# Patient Record
Sex: Female | Born: 1935
Health system: Southern US, Community
[De-identification: ages and names within clinical notes are randomized; demographics above are authoritative.]

## PROBLEM LIST (undated history)

## (undated) DIAGNOSIS — I8390 Asymptomatic varicose veins of unspecified lower extremity: Secondary | ICD-10-CM

## (undated) DIAGNOSIS — D649 Anemia, unspecified: Secondary | ICD-10-CM

## (undated) DIAGNOSIS — M1990 Unspecified osteoarthritis, unspecified site: Secondary | ICD-10-CM

## (undated) DIAGNOSIS — H269 Unspecified cataract: Secondary | ICD-10-CM

## (undated) DIAGNOSIS — E440 Moderate protein-calorie malnutrition: Secondary | ICD-10-CM

## (undated) DIAGNOSIS — G603 Idiopathic progressive neuropathy: Secondary | ICD-10-CM

## (undated) DIAGNOSIS — I10 Essential (primary) hypertension: Secondary | ICD-10-CM

## (undated) DIAGNOSIS — N2581 Secondary hyperparathyroidism of renal origin: Secondary | ICD-10-CM

## (undated) DIAGNOSIS — M5416 Radiculopathy, lumbar region: Secondary | ICD-10-CM

## (undated) DIAGNOSIS — M4126 Other idiopathic scoliosis, lumbar region: Secondary | ICD-10-CM

## (undated) DIAGNOSIS — D509 Iron deficiency anemia, unspecified: Secondary | ICD-10-CM

## (undated) DIAGNOSIS — F5101 Primary insomnia: Secondary | ICD-10-CM

## (undated) DIAGNOSIS — M1711 Unilateral primary osteoarthritis, right knee: Secondary | ICD-10-CM

## (undated) DIAGNOSIS — J479 Bronchiectasis, uncomplicated: Secondary | ICD-10-CM

## (undated) DIAGNOSIS — F419 Anxiety disorder, unspecified: Secondary | ICD-10-CM

## (undated) DIAGNOSIS — I728 Aneurysm of other specified arteries: Secondary | ICD-10-CM

## (undated) DIAGNOSIS — E785 Hyperlipidemia, unspecified: Secondary | ICD-10-CM

## (undated) DIAGNOSIS — M150 Primary generalized (osteo)arthritis: Secondary | ICD-10-CM

## (undated) DIAGNOSIS — M1712 Unilateral primary osteoarthritis, left knee: Secondary | ICD-10-CM

## (undated) DIAGNOSIS — J45909 Unspecified asthma, uncomplicated: Secondary | ICD-10-CM

## (undated) DIAGNOSIS — K5904 Chronic idiopathic constipation: Secondary | ICD-10-CM

## (undated) DIAGNOSIS — N390 Urinary tract infection, site not specified: Secondary | ICD-10-CM

## (undated) DIAGNOSIS — N183 Chronic kidney disease, stage 3 unspecified: Secondary | ICD-10-CM

## (undated) DIAGNOSIS — E079 Disorder of thyroid, unspecified: Secondary | ICD-10-CM

## (undated) DIAGNOSIS — I87393 Chronic venous hypertension (idiopathic) with other complications of bilateral lower extremity: Secondary | ICD-10-CM

## (undated) DIAGNOSIS — M419 Scoliosis, unspecified: Secondary | ICD-10-CM

## (undated) DIAGNOSIS — G8929 Other chronic pain: Secondary | ICD-10-CM

## (undated) DIAGNOSIS — T790XXA Air embolism (traumatic), initial encounter: Secondary | ICD-10-CM

## (undated) DIAGNOSIS — E559 Vitamin D deficiency, unspecified: Secondary | ICD-10-CM

## (undated) DIAGNOSIS — R079 Chest pain, unspecified: Secondary | ICD-10-CM

## (undated) DIAGNOSIS — F329 Major depressive disorder, single episode, unspecified: Secondary | ICD-10-CM

## (undated) DIAGNOSIS — F32A Depression, unspecified: Secondary | ICD-10-CM

## (undated) DIAGNOSIS — R011 Cardiac murmur, unspecified: Secondary | ICD-10-CM

## (undated) DIAGNOSIS — K219 Gastro-esophageal reflux disease without esophagitis: Secondary | ICD-10-CM

## (undated) HISTORY — DX: Other chronic pain: G89.29

## (undated) HISTORY — DX: Depression, unspecified: F32.A

## (undated) HISTORY — DX: Iron deficiency anemia, unspecified: D50.9

## (undated) HISTORY — DX: Urinary tract infection, site not specified: N39.0

## (undated) HISTORY — DX: Aneurysm of other specified arteries: I72.8

## (undated) HISTORY — DX: Major depressive disorder, single episode, unspecified: F32.9

## (undated) HISTORY — DX: Vitamin D deficiency, unspecified: E55.9

## (undated) HISTORY — DX: Unspecified asthma, uncomplicated: J45.909

## (undated) HISTORY — DX: Essential (primary) hypertension: I10

## (undated) HISTORY — DX: Other idiopathic scoliosis, lumbar region: M41.26

## (undated) HISTORY — DX: Chronic idiopathic constipation: K59.04

## (undated) HISTORY — DX: Chronic venous hypertension (idiopathic) with other complications of bilateral lower extremity: I87.393

## (undated) HISTORY — DX: Cardiac murmur, unspecified: R01.1

## (undated) HISTORY — DX: Anxiety disorder, unspecified: F41.9

## (undated) HISTORY — DX: Air embolism (traumatic), initial encounter: T79.0XXA

## (undated) HISTORY — DX: Disorder of thyroid, unspecified: E07.9

## (undated) HISTORY — PX: ABDOMINAL HYSTERECTOMY: SHX81

## (undated) HISTORY — DX: Unspecified cataract: H26.9

## (undated) HISTORY — DX: Chronic kidney disease, stage 3 unspecified: N18.30

## (undated) HISTORY — DX: Unilateral primary osteoarthritis, right knee: M17.11

## (undated) HISTORY — DX: Chest pain, unspecified: R07.9

## (undated) HISTORY — DX: Unspecified osteoarthritis, unspecified site: M19.90

## (undated) HISTORY — DX: Secondary hyperparathyroidism of renal origin: N25.81

## (undated) HISTORY — DX: Hyperlipidemia, unspecified: E78.5

## (undated) HISTORY — PX: CYSTOCELE REPAIR: SHX163

## (undated) HISTORY — DX: Anemia, unspecified: D64.9

## (undated) HISTORY — DX: Idiopathic progressive neuropathy: G60.3

## (undated) HISTORY — PX: SALPINGOOPHORECTOMY: SHX82

## (undated) HISTORY — DX: Radiculopathy, lumbar region: M54.16

## (undated) HISTORY — DX: Asymptomatic varicose veins of unspecified lower extremity: I83.90

## (undated) HISTORY — DX: Unilateral primary osteoarthritis, left knee: M17.12

## (undated) HISTORY — DX: Gastro-esophageal reflux disease without esophagitis: K21.9

## (undated) HISTORY — DX: Primary insomnia: F51.01

## (undated) HISTORY — DX: Moderate protein-calorie malnutrition: E44.0

## (undated) HISTORY — DX: Primary generalized (osteo)arthritis: M15.0

---

## 1998-02-12 ENCOUNTER — Emergency Department (HOSPITAL_COMMUNITY): Admission: EM | Admit: 1998-02-12 | Discharge: 1998-02-12 | Payer: Self-pay | Admitting: Emergency Medicine

## 1998-02-25 ENCOUNTER — Ambulatory Visit: Admission: RE | Admit: 1998-02-25 | Discharge: 1998-02-25 | Payer: Self-pay

## 1998-03-09 ENCOUNTER — Encounter: Admission: RE | Admit: 1998-03-09 | Discharge: 1998-03-09 | Payer: Self-pay | Admitting: Internal Medicine

## 1998-08-11 ENCOUNTER — Encounter: Admission: RE | Admit: 1998-08-11 | Discharge: 1998-08-11 | Payer: Self-pay | Admitting: Internal Medicine

## 1998-09-23 ENCOUNTER — Encounter: Admission: RE | Admit: 1998-09-23 | Discharge: 1998-09-23 | Payer: Self-pay | Admitting: Internal Medicine

## 1999-02-07 ENCOUNTER — Encounter: Admission: RE | Admit: 1999-02-07 | Discharge: 1999-02-07 | Payer: Self-pay | Admitting: Internal Medicine

## 1999-02-07 ENCOUNTER — Ambulatory Visit (HOSPITAL_COMMUNITY): Admission: RE | Admit: 1999-02-07 | Discharge: 1999-02-07 | Payer: Self-pay | Admitting: Internal Medicine

## 1999-02-07 ENCOUNTER — Encounter: Payer: Self-pay | Admitting: Internal Medicine

## 1999-02-13 ENCOUNTER — Ambulatory Visit (HOSPITAL_COMMUNITY): Admission: RE | Admit: 1999-02-13 | Discharge: 1999-02-13 | Payer: Self-pay

## 1999-02-14 ENCOUNTER — Encounter: Admission: RE | Admit: 1999-02-14 | Discharge: 1999-02-14 | Payer: Self-pay | Admitting: Hematology and Oncology

## 1999-03-17 ENCOUNTER — Encounter: Admission: RE | Admit: 1999-03-17 | Discharge: 1999-03-17 | Payer: Self-pay | Admitting: Internal Medicine

## 1999-06-12 ENCOUNTER — Encounter: Admission: RE | Admit: 1999-06-12 | Discharge: 1999-06-12 | Payer: Self-pay | Admitting: Internal Medicine

## 1999-07-10 ENCOUNTER — Encounter: Admission: RE | Admit: 1999-07-10 | Discharge: 1999-07-10 | Payer: Self-pay | Admitting: Internal Medicine

## 1999-08-21 ENCOUNTER — Ambulatory Visit (HOSPITAL_COMMUNITY): Admission: RE | Admit: 1999-08-21 | Discharge: 1999-08-21 | Payer: Self-pay

## 1999-09-25 ENCOUNTER — Encounter: Admission: RE | Admit: 1999-09-25 | Discharge: 1999-09-25 | Payer: Self-pay | Admitting: Internal Medicine

## 2000-01-22 ENCOUNTER — Encounter: Admission: RE | Admit: 2000-01-22 | Discharge: 2000-01-22 | Payer: Self-pay | Admitting: Internal Medicine

## 2000-07-29 ENCOUNTER — Encounter: Admission: RE | Admit: 2000-07-29 | Discharge: 2000-07-29 | Payer: Self-pay | Admitting: Internal Medicine

## 2000-10-01 ENCOUNTER — Encounter: Admission: RE | Admit: 2000-10-01 | Discharge: 2000-10-01 | Payer: Self-pay

## 2000-10-08 ENCOUNTER — Ambulatory Visit (HOSPITAL_COMMUNITY): Admission: RE | Admit: 2000-10-08 | Discharge: 2000-10-08 | Payer: Self-pay

## 2001-01-27 ENCOUNTER — Encounter: Admission: RE | Admit: 2001-01-27 | Discharge: 2001-01-27 | Payer: Self-pay

## 2001-01-29 ENCOUNTER — Encounter: Admission: RE | Admit: 2001-01-29 | Discharge: 2001-01-29 | Payer: Self-pay

## 2001-03-31 ENCOUNTER — Encounter: Admission: RE | Admit: 2001-03-31 | Discharge: 2001-03-31 | Payer: Self-pay | Admitting: Internal Medicine

## 2014-10-26 DIAGNOSIS — M25562 Pain in left knee: Secondary | ICD-10-CM | POA: Diagnosis not present

## 2014-10-26 DIAGNOSIS — G5792 Unspecified mononeuropathy of left lower limb: Secondary | ICD-10-CM | POA: Diagnosis not present

## 2014-10-26 DIAGNOSIS — G5791 Unspecified mononeuropathy of right lower limb: Secondary | ICD-10-CM | POA: Diagnosis not present

## 2014-11-15 DIAGNOSIS — R3915 Urgency of urination: Secondary | ICD-10-CM | POA: Diagnosis not present

## 2014-11-15 DIAGNOSIS — N3000 Acute cystitis without hematuria: Secondary | ICD-10-CM | POA: Diagnosis not present

## 2014-11-18 DIAGNOSIS — B9620 Unspecified Escherichia coli [E. coli] as the cause of diseases classified elsewhere: Secondary | ICD-10-CM | POA: Diagnosis not present

## 2014-11-18 DIAGNOSIS — N8110 Cystocele, unspecified: Secondary | ICD-10-CM | POA: Diagnosis not present

## 2014-11-18 DIAGNOSIS — N10 Acute tubulo-interstitial nephritis: Secondary | ICD-10-CM | POA: Diagnosis not present

## 2014-11-18 DIAGNOSIS — R05 Cough: Secondary | ICD-10-CM | POA: Diagnosis not present

## 2014-11-18 DIAGNOSIS — R0989 Other specified symptoms and signs involving the circulatory and respiratory systems: Secondary | ICD-10-CM | POA: Diagnosis not present

## 2014-11-18 DIAGNOSIS — E876 Hypokalemia: Secondary | ICD-10-CM | POA: Diagnosis not present

## 2014-11-18 DIAGNOSIS — R509 Fever, unspecified: Secondary | ICD-10-CM | POA: Diagnosis not present

## 2014-11-18 DIAGNOSIS — R531 Weakness: Secondary | ICD-10-CM | POA: Diagnosis not present

## 2014-11-18 DIAGNOSIS — I10 Essential (primary) hypertension: Secondary | ICD-10-CM | POA: Diagnosis not present

## 2014-11-18 DIAGNOSIS — R300 Dysuria: Secondary | ICD-10-CM | POA: Diagnosis not present

## 2014-11-18 DIAGNOSIS — J479 Bronchiectasis, uncomplicated: Secondary | ICD-10-CM | POA: Diagnosis not present

## 2014-11-18 DIAGNOSIS — N390 Urinary tract infection, site not specified: Secondary | ICD-10-CM | POA: Diagnosis not present

## 2014-11-23 DIAGNOSIS — E786 Lipoprotein deficiency: Secondary | ICD-10-CM | POA: Diagnosis not present

## 2014-11-23 DIAGNOSIS — E782 Mixed hyperlipidemia: Secondary | ICD-10-CM | POA: Diagnosis not present

## 2014-11-23 DIAGNOSIS — I10 Essential (primary) hypertension: Secondary | ICD-10-CM | POA: Diagnosis not present

## 2014-11-23 DIAGNOSIS — N10 Acute tubulo-interstitial nephritis: Secondary | ICD-10-CM | POA: Diagnosis not present

## 2014-11-23 DIAGNOSIS — J471 Bronchiectasis with (acute) exacerbation: Secondary | ICD-10-CM | POA: Diagnosis not present

## 2014-11-23 DIAGNOSIS — R42 Dizziness and giddiness: Secondary | ICD-10-CM | POA: Diagnosis not present

## 2014-12-03 ENCOUNTER — Institutional Professional Consult (permissible substitution): Payer: Self-pay | Admitting: Internal Medicine

## 2014-12-03 DIAGNOSIS — R6883 Chills (without fever): Secondary | ICD-10-CM | POA: Diagnosis not present

## 2014-12-03 DIAGNOSIS — J181 Lobar pneumonia, unspecified organism: Secondary | ICD-10-CM | POA: Diagnosis not present

## 2014-12-03 DIAGNOSIS — R062 Wheezing: Secondary | ICD-10-CM | POA: Diagnosis not present

## 2014-12-07 ENCOUNTER — Encounter: Payer: Self-pay | Admitting: Cardiovascular Disease

## 2014-12-07 DIAGNOSIS — R600 Localized edema: Secondary | ICD-10-CM | POA: Diagnosis not present

## 2014-12-07 DIAGNOSIS — R011 Cardiac murmur, unspecified: Secondary | ICD-10-CM | POA: Diagnosis not present

## 2014-12-07 DIAGNOSIS — J449 Chronic obstructive pulmonary disease, unspecified: Secondary | ICD-10-CM | POA: Diagnosis not present

## 2014-12-07 DIAGNOSIS — R0602 Shortness of breath: Secondary | ICD-10-CM | POA: Diagnosis not present

## 2014-12-07 DIAGNOSIS — J181 Lobar pneumonia, unspecified organism: Secondary | ICD-10-CM | POA: Diagnosis not present

## 2014-12-07 DIAGNOSIS — I083 Combined rheumatic disorders of mitral, aortic and tricuspid valves: Secondary | ICD-10-CM | POA: Diagnosis not present

## 2014-12-08 ENCOUNTER — Institutional Professional Consult (permissible substitution): Payer: Self-pay | Admitting: Internal Medicine

## 2014-12-10 DIAGNOSIS — R0602 Shortness of breath: Secondary | ICD-10-CM | POA: Diagnosis not present

## 2014-12-16 DIAGNOSIS — R531 Weakness: Secondary | ICD-10-CM | POA: Diagnosis not present

## 2014-12-16 DIAGNOSIS — F329 Major depressive disorder, single episode, unspecified: Secondary | ICD-10-CM | POA: Diagnosis not present

## 2014-12-16 DIAGNOSIS — I509 Heart failure, unspecified: Secondary | ICD-10-CM | POA: Diagnosis not present

## 2014-12-20 DIAGNOSIS — I509 Heart failure, unspecified: Secondary | ICD-10-CM | POA: Diagnosis not present

## 2014-12-20 DIAGNOSIS — R531 Weakness: Secondary | ICD-10-CM | POA: Diagnosis not present

## 2014-12-20 DIAGNOSIS — F329 Major depressive disorder, single episode, unspecified: Secondary | ICD-10-CM | POA: Diagnosis not present

## 2014-12-21 DIAGNOSIS — I509 Heart failure, unspecified: Secondary | ICD-10-CM | POA: Diagnosis not present

## 2014-12-21 DIAGNOSIS — R531 Weakness: Secondary | ICD-10-CM | POA: Diagnosis not present

## 2014-12-21 DIAGNOSIS — F329 Major depressive disorder, single episode, unspecified: Secondary | ICD-10-CM | POA: Diagnosis not present

## 2014-12-23 DIAGNOSIS — I509 Heart failure, unspecified: Secondary | ICD-10-CM | POA: Diagnosis not present

## 2014-12-23 DIAGNOSIS — F329 Major depressive disorder, single episode, unspecified: Secondary | ICD-10-CM | POA: Diagnosis not present

## 2014-12-23 DIAGNOSIS — R531 Weakness: Secondary | ICD-10-CM | POA: Diagnosis not present

## 2014-12-24 DIAGNOSIS — R634 Abnormal weight loss: Secondary | ICD-10-CM | POA: Diagnosis not present

## 2014-12-24 DIAGNOSIS — I10 Essential (primary) hypertension: Secondary | ICD-10-CM | POA: Diagnosis not present

## 2014-12-24 DIAGNOSIS — K5909 Other constipation: Secondary | ICD-10-CM | POA: Diagnosis not present

## 2014-12-24 DIAGNOSIS — R5383 Other fatigue: Secondary | ICD-10-CM | POA: Diagnosis not present

## 2014-12-24 DIAGNOSIS — R0602 Shortness of breath: Secondary | ICD-10-CM | POA: Diagnosis not present

## 2014-12-24 DIAGNOSIS — R29898 Other symptoms and signs involving the musculoskeletal system: Secondary | ICD-10-CM | POA: Diagnosis not present

## 2014-12-24 DIAGNOSIS — D6489 Other specified anemias: Secondary | ICD-10-CM | POA: Diagnosis not present

## 2014-12-25 DIAGNOSIS — R531 Weakness: Secondary | ICD-10-CM | POA: Diagnosis not present

## 2014-12-25 DIAGNOSIS — F329 Major depressive disorder, single episode, unspecified: Secondary | ICD-10-CM | POA: Diagnosis not present

## 2014-12-25 DIAGNOSIS — I509 Heart failure, unspecified: Secondary | ICD-10-CM | POA: Diagnosis not present

## 2014-12-27 DIAGNOSIS — F329 Major depressive disorder, single episode, unspecified: Secondary | ICD-10-CM | POA: Diagnosis not present

## 2014-12-27 DIAGNOSIS — R531 Weakness: Secondary | ICD-10-CM | POA: Diagnosis not present

## 2014-12-27 DIAGNOSIS — I509 Heart failure, unspecified: Secondary | ICD-10-CM | POA: Diagnosis not present

## 2014-12-28 DIAGNOSIS — F329 Major depressive disorder, single episode, unspecified: Secondary | ICD-10-CM | POA: Diagnosis not present

## 2014-12-28 DIAGNOSIS — R531 Weakness: Secondary | ICD-10-CM | POA: Diagnosis not present

## 2014-12-28 DIAGNOSIS — I509 Heart failure, unspecified: Secondary | ICD-10-CM | POA: Diagnosis not present

## 2014-12-29 DIAGNOSIS — F329 Major depressive disorder, single episode, unspecified: Secondary | ICD-10-CM | POA: Diagnosis not present

## 2014-12-29 DIAGNOSIS — R531 Weakness: Secondary | ICD-10-CM | POA: Diagnosis not present

## 2014-12-29 DIAGNOSIS — I509 Heart failure, unspecified: Secondary | ICD-10-CM | POA: Diagnosis not present

## 2014-12-30 ENCOUNTER — Encounter: Payer: Self-pay | Admitting: *Deleted

## 2014-12-30 ENCOUNTER — Telehealth: Payer: Self-pay | Admitting: *Deleted

## 2014-12-30 NOTE — Telephone Encounter (Signed)
DONE

## 2014-12-31 DIAGNOSIS — M4186 Other forms of scoliosis, lumbar region: Secondary | ICD-10-CM | POA: Diagnosis not present

## 2014-12-31 DIAGNOSIS — M4856XA Collapsed vertebra, not elsewhere classified, lumbar region, initial encounter for fracture: Secondary | ICD-10-CM | POA: Diagnosis not present

## 2014-12-31 DIAGNOSIS — M545 Low back pain: Secondary | ICD-10-CM | POA: Diagnosis not present

## 2014-12-31 DIAGNOSIS — E539 Vitamin B deficiency, unspecified: Secondary | ICD-10-CM | POA: Diagnosis not present

## 2015-01-02 NOTE — Progress Notes (Signed)
Patient ID: Stephanie Clay, female   DOB: [birth year], 81 y.o.   MRN: [id number]     Cardiology Office Note   Date:  01/03/2015   ID:  Stephanie Clay, DOB 07/17/1932, MRN [id number]  PCP:  [facility]   Cardiologist:   Jenkins Rouge, MD   No chief complaint on file.     History of Present Illness: Stephanie Clay is a 81 y.o. female who presents for  Evaluation of "CHF:.  Reviewed office notes from [place] [facility] 2/13 and on and [facility] records   Patient appeared to have pneumonia and was Rx with antibiotics zitrhromycin, rocephin and duonebs.  Influenza negative She has HTN and elevated lipids.  She has some CRF and has had lisinopril held and diuretic dose decreased Also has had recent UTI  Has asthma with bronchiectasis and has been referred to pulmonary She has poor insight into her bronchiectasis Chronic LE edema improved with diuretic  Wears support hose No chest pain. No palpitations or syncope  Labs 2/16 K 3.1 Cr .9 BNP 2280 ( upper normal 1800 )   Hct 31.9   CXR:  COPD hyperinflation increased interstitial markings   Echo: Septum 9 mm  LA 3.2 cm  EF 37%  Normal diastolic filling pattern for age mild AR estimated PA pressure 50 mmHg    Past Medical History  Diagnosis Date  . Hyperlipemia   . Asthma   . Gastro-esophageal reflux   . Chronic chest pain   . Secondary hyperparathyroidism   . Iron (Fe) deficiency anemia   . Pneumathemia     Past Surgical History  Procedure Laterality Date  . Abdominal hysterectomy    . Salpingoophorectomy    . Bladder tech       Current Outpatient Prescriptions  Medication Sig Dispense Refill  . ALPRAZolam (XANAX) 0.5 MG tablet Take 0.5 mg by mouth 2 (two) times daily as needed for anxiety.    Marland Kitchen amitriptyline (ELAVIL) 25 MG tablet Take 25 mg by mouth at bedtime.    Marland Kitchen aspirin 81 MG tablet Take 81 mg by mouth daily.    Marland Kitchen atenolol-chlorthalidone (TENORETIC) 100-25 MG per tablet Take 1 tablet by mouth daily.    Marland Kitchen  conjugated estrogens (PREMARIN) vaginal cream Place 1 Applicatorful vaginally daily.    Marland Kitchen dicyclomine (BENTYL) 10 MG capsule Take 10 mg by mouth 4 (four) times daily -  before meals and at bedtime.    . fluticasone (FLONASE) 50 MCG/ACT nasal spray Place 2 sprays into both nostrils daily.    . furosemide (LASIX) 40 MG tablet Take 40 mg by mouth daily.    Marland Kitchen gabapentin (NEURONTIN) 100 MG capsule Take 100 mg by mouth at bedtime.    Marland Kitchen HYDROcodone-acetaminophen (NORCO) 7.5-325 MG per tablet Take 1 tablet by mouth every 6 (six) hours as needed for moderate pain.    . hydrOXYzine (ATARAX/VISTARIL) 25 MG tablet Take 25 mg by mouth 3 (three) times daily as needed.    Marland Kitchen levothyroxine (SYNTHROID, LEVOTHROID) 50 MCG tablet Take 50 mcg by mouth daily before breakfast.    . ondansetron (ZOFRAN) 4 MG tablet Take 4 mg by mouth every 8 (eight) hours as needed for nausea or vomiting.    . pantoprazole (PROTONIX) 40 MG tablet Take 40 mg by mouth daily.    . potassium chloride SA (K-DUR,KLOR-CON) 20 MEQ tablet Take 20 mEq by mouth 2 (two) times daily.    . simvastatin (ZOCOR) 40 MG tablet Take 40  mg by mouth daily. Take 2 tablets by mouth daily    . triamcinolone cream (KENALOG) 0.1 % Apply 1 application topically 2 (two) times daily.     No current facility-administered medications for this visit.    Allergies:   Gabapentin; Levaquin; Lisinopril; and Remeron    Social History:  The patient  reports that she has never smoked. She does not have any smokeless tobacco history on file. She reports that she does not drink alcohol or use illicit drugs.   Family History:  The patient's family history is not on file.    ROS:  Please see the history of present illness.   Otherwise, review of systems are positive for none.   All other systems are reviewed and negative.    PHYSICAL EXAM: VS:  BP 110/50 mmHg  Pulse 65  Ht '4\' 11"'  (1.499 m)  Wt 44.815 kg (98 lb 12.8 oz)  BMI 19.94 kg/m2  SpO2 98% , BMI Body mass  index is 19.94 kg/(m^2). GEN: Well nourished, well developed, in no acute distress HEENT: normal Neck: no JVD, carotid bruits, or masses Cardiac:  RRR; no murmurs, rubs, or gallops,Trace LE edema  Respiratory:  Areas of rhonchi and wheezing  GI: soft, nontender, nondistended, + BS MS: no deformity or atrophy Skin: warm and dry, no rash Neuro:  Strength and sensation are intact Psych: euthymic mood, full affect   EKG:   SR rate 60 Normal 12/07/14    Recent Labs: No results found for requested labs within last 365 days.    Lipid Panel No results found for: CHOL, TRIG, HDL, CHOLHDL, VLDL, LDLCALC, LDLDIRECT    Wt Readings from Last 3 Encounters:  01/03/15 44.815 kg (98 lb 12.8 oz)      Other studies Reviewed: Additional studies/ records that were reviewed today include:  30 minutes records from [place] and [facility] see HPI.    ASSESSMENT AND PLAN:  1.  Dyspne:  This is primarily from lung disease as is elevation in PA pressure estimate 50 mmHg on echo.  Had minimally elelvated BNP with increased interstitial markings On CXR during infectious illness of lungs may have leake a little fluid in an ARDS like pattern.  Echo shows normal EF no bad valves and normal diastolic pattern for age Would keep appt with pulmonary  She has met Dr Melvyn Novas before ( husbands lung doctor) and doesn't like him  Will refer to Dr Gwenette Greet   2 Edema:  Related to lung disease and venous insuf.  Continue lasix as needed with sliding scale 3. HTN continue tenoretic 4. Thyroid continue replacement TSH normal  5. Chol:  Continue statin    Current medicines are reviewed at length with the patient today.  The patient does not have concerns regarding medicines.  The following changes have been made:  no change  Labs/ tests ordered today include: Refer to Dr Gwenette Greet Pulmonary for Bronchiectasis   Orders Placed This Encounter  Procedures  . Ambulatory referral to Pulmonology     Disposition:    FU with  PRN     Signed, Jenkins Rouge, MD  01/03/2015 4:02 PM    [facility] [address], [place], [place]  [address] Phone: [phone]; Fax: [phone]

## 2015-01-03 ENCOUNTER — Encounter: Payer: Self-pay | Admitting: Cardiovascular Disease

## 2015-01-03 ENCOUNTER — Ambulatory Visit (INDEPENDENT_AMBULATORY_CARE_PROVIDER_SITE_OTHER): Payer: Medicare Other | Admitting: Cardiovascular Disease

## 2015-01-03 VITALS — BP 110/50 | HR 65 | Ht 59.0 in | Wt 98.8 lb

## 2015-01-03 DIAGNOSIS — J471 Bronchiectasis with (acute) exacerbation: Secondary | ICD-10-CM | POA: Insufficient documentation

## 2015-01-03 DIAGNOSIS — J479 Bronchiectasis, uncomplicated: Secondary | ICD-10-CM

## 2015-01-03 HISTORY — DX: Bronchiectasis, uncomplicated: J47.9

## 2015-01-03 HISTORY — DX: Bronchiectasis with (acute) exacerbation: J47.1

## 2015-01-03 NOTE — Patient Instructions (Addendum)
Your physician recommends that you schedule a follow-up appointment in: AS NEEDED   Your physician recommends that you continue on your current medications as directed. Please refer to the Current Medication list given to you today.  You have been referred to PULMONARY    DR  [facility]  ON  Monday OR Tuesday   J47.9

## 2015-01-04 DIAGNOSIS — R531 Weakness: Secondary | ICD-10-CM | POA: Diagnosis not present

## 2015-01-04 DIAGNOSIS — I509 Heart failure, unspecified: Secondary | ICD-10-CM | POA: Diagnosis not present

## 2015-01-04 DIAGNOSIS — F329 Major depressive disorder, single episode, unspecified: Secondary | ICD-10-CM | POA: Diagnosis not present

## 2015-01-05 DIAGNOSIS — F329 Major depressive disorder, single episode, unspecified: Secondary | ICD-10-CM | POA: Diagnosis not present

## 2015-01-05 DIAGNOSIS — R531 Weakness: Secondary | ICD-10-CM | POA: Diagnosis not present

## 2015-01-05 DIAGNOSIS — I509 Heart failure, unspecified: Secondary | ICD-10-CM | POA: Diagnosis not present

## 2015-01-07 DIAGNOSIS — I509 Heart failure, unspecified: Secondary | ICD-10-CM | POA: Diagnosis not present

## 2015-01-07 DIAGNOSIS — F329 Major depressive disorder, single episode, unspecified: Secondary | ICD-10-CM | POA: Diagnosis not present

## 2015-01-07 DIAGNOSIS — R531 Weakness: Secondary | ICD-10-CM | POA: Diagnosis not present

## 2015-01-10 DIAGNOSIS — M150 Primary generalized (osteo)arthritis: Secondary | ICD-10-CM | POA: Diagnosis not present

## 2015-01-14 DIAGNOSIS — E539 Vitamin B deficiency, unspecified: Secondary | ICD-10-CM | POA: Diagnosis not present

## 2015-01-15 DIAGNOSIS — Z792 Long term (current) use of antibiotics: Secondary | ICD-10-CM | POA: Diagnosis not present

## 2015-01-15 DIAGNOSIS — R509 Fever, unspecified: Secondary | ICD-10-CM | POA: Diagnosis not present

## 2015-01-15 DIAGNOSIS — J189 Pneumonia, unspecified organism: Secondary | ICD-10-CM | POA: Diagnosis not present

## 2015-01-15 DIAGNOSIS — J45909 Unspecified asthma, uncomplicated: Secondary | ICD-10-CM | POA: Diagnosis not present

## 2015-01-15 DIAGNOSIS — J984 Other disorders of lung: Secondary | ICD-10-CM | POA: Diagnosis not present

## 2015-01-15 DIAGNOSIS — Z7982 Long term (current) use of aspirin: Secondary | ICD-10-CM | POA: Diagnosis not present

## 2015-01-15 DIAGNOSIS — F329 Major depressive disorder, single episode, unspecified: Secondary | ICD-10-CM | POA: Diagnosis not present

## 2015-01-15 DIAGNOSIS — R404 Transient alteration of awareness: Secondary | ICD-10-CM | POA: Diagnosis not present

## 2015-01-15 DIAGNOSIS — R531 Weakness: Secondary | ICD-10-CM | POA: Diagnosis not present

## 2015-01-15 DIAGNOSIS — I10 Essential (primary) hypertension: Secondary | ICD-10-CM | POA: Diagnosis not present

## 2015-01-21 DIAGNOSIS — E539 Vitamin B deficiency, unspecified: Secondary | ICD-10-CM | POA: Diagnosis not present

## 2015-01-28 DIAGNOSIS — M150 Primary generalized (osteo)arthritis: Secondary | ICD-10-CM | POA: Diagnosis not present

## 2015-01-28 DIAGNOSIS — M21241 Flexion deformity, right finger joints: Secondary | ICD-10-CM | POA: Diagnosis not present

## 2015-01-28 DIAGNOSIS — J181 Lobar pneumonia, unspecified organism: Secondary | ICD-10-CM | POA: Diagnosis not present

## 2015-02-01 ENCOUNTER — Institutional Professional Consult (permissible substitution): Payer: Medicare Other | Admitting: Pulmonary Disease

## 2015-02-02 ENCOUNTER — Institutional Professional Consult (permissible substitution): Payer: Medicare Other | Admitting: Pulmonary Disease

## 2015-02-07 ENCOUNTER — Ambulatory Visit (INDEPENDENT_AMBULATORY_CARE_PROVIDER_SITE_OTHER): Payer: Medicare Other | Admitting: Pulmonary Disease

## 2015-02-07 ENCOUNTER — Encounter: Payer: Self-pay | Admitting: Pulmonary Disease

## 2015-02-07 DIAGNOSIS — J479 Bronchiectasis, uncomplicated: Secondary | ICD-10-CM

## 2015-02-07 NOTE — Assessment & Plan Note (Signed)
The patient has significant bronchiectasis involving her right lung, along with associated tree-in-bud abnormalities and patchy groundglass opacities in the right lower lobe. She also has dyspnea on exertion, and I suspect she has underlying obstructive lung disease associated with her bronchiectasis. She also has been having weight loss, and may have MAC colonization/infection. At this point, she needs to have cultures done of her sputum, and also for PFTs to evaluate for airflow obstruction. She also gives a history for dysphasia and possible intermittent aspiration, and perhaps this is why her bronchiectasis is limited to her right lung. Will consider doing a barium swallow and or modified barium swallow at the next visit.

## 2015-02-07 NOTE — Progress Notes (Signed)
   Subjective:    Patient ID: Stephanie Clay, female    DOB: 11-13-1931, 81 y.o.   MRN: [id number]  HPI The patient is an 81 year old female who I've been asked to see for bronchiectasis. She has had dyspnea on exertion with moderate activities for some time, but does not feel that it is progressive. She will get winded walking up one flight of stairs, trying to bringing groceries, or doing her housework. She has had an echocardiogram that showed a normal ejection fraction, mild left atrial enlargement, mild AI, and mild to moderate mitral regurgitation. She also had moderately elevated right ventricular pressures. She has had a CT of her chest that shows bronchiectasis involving her right lung, scattered tree-in-bud abnormalities, as well as patchy groundglass infiltrates in her right lower lobe. Her left lung is fairly clear. She has no past history of smoking of significance, and only describes some minor and intermittent cough with white to yellow mucus. This can occur primarily in the evening, along with intermittent chest congestion. She denies any family history of bronchiectasis, nor does she have frequent pulmonary infections from young adulthood. She does note difficulty with dysphasia, and feels that her food "gets stuck". She also feels that her food goes down the wrong way at times. She has had a poor appetite over the last 6 months, as well as a 16 pound weight loss.   Review of Systems  Constitutional: Positive for appetite change and unexpected weight change. Negative for fever.  HENT: Negative for congestion, dental problem, ear pain, nosebleeds, postnasal drip, rhinorrhea, sinus pressure, sneezing, sore throat and trouble swallowing.   Eyes: Negative for redness and itching.  Respiratory: Positive for cough, shortness of breath and wheezing. Negative for chest tightness.   Cardiovascular: Positive for leg swelling. Negative for palpitations.  Gastrointestinal: Positive for abdominal  pain. Negative for nausea and vomiting.  Genitourinary: Negative for dysuria.  Musculoskeletal: Negative for joint swelling.  Skin: Negative for rash.  Neurological: Positive for headaches.  Hematological: Does not bruise/bleed easily.  Psychiatric/Behavioral: Positive for dysphoric mood. The patient is not nervous/anxious.        Objective:   Physical Exam Constitutional:  Thin and frail appearing female, no acute distress  HENT:  Nares patent without discharge  Oropharynx without exudate, palate and uvula are normal  Eyes:  Perrla, eomi, no scleral icterus  Neck:  No JVD, no TMG  Cardiovascular:  Normal rate, regular rhythm, no rubs or gallops.  No murmurs        Intact distal pulses  Pulmonary :  Normal breath sounds, no stridor or respiratory distress   No rhonchi, or wheezing.  +scattered crackles throughout.  Abdominal:  Soft, nondistended, bowel sounds present.  No tenderness noted.   Musculoskeletal:  No lower extremity edema noted.  Lymph Nodes:  No cervical lymphadenopathy noted  Skin:  No cyanosis noted  Neurologic:  Alert, appropriate, moves all 4 extremities without obvious deficit.         Assessment & Plan:

## 2015-02-07 NOTE — Patient Instructions (Signed)
Will schedule for breathing studies on the same day as your followup with me. We need sputum specimen brought to the lab.  If you do it at night, put in fridge and try to get to us by 8:00 the next morning.   Will see you back, and we can discuss looking at your swallowing at that time.

## 2015-02-21 ENCOUNTER — Other Ambulatory Visit: Payer: Medicare Other

## 2015-02-21 DIAGNOSIS — J479 Bronchiectasis, uncomplicated: Secondary | ICD-10-CM

## 2015-02-24 LAB — RESPIRATORY CULTURE OR RESPIRATORY AND SPUTUM CULTURE
Culture: NORMAL
Organism ID, Bacteria: NORMAL

## 2015-03-01 ENCOUNTER — Ambulatory Visit (INDEPENDENT_AMBULATORY_CARE_PROVIDER_SITE_OTHER): Payer: Medicare Other | Admitting: Pulmonary Disease

## 2015-03-01 ENCOUNTER — Encounter: Payer: Self-pay | Admitting: Pulmonary Disease

## 2015-03-01 ENCOUNTER — Ambulatory Visit (HOSPITAL_COMMUNITY)
Admission: RE | Admit: 2015-03-01 | Discharge: 2015-03-01 | Disposition: A | Discharge status: Home / Self Care | Payer: Medicare Other | Source: Ambulatory Visit | Attending: Pulmonary Disease | Admitting: Pulmonary Disease

## 2015-03-01 VITALS — BP 116/64 | HR 56 | Temp 97.2°F | Ht 59.0 in | Wt 91.0 lb

## 2015-03-01 DIAGNOSIS — R1310 Dysphagia, unspecified: Secondary | ICD-10-CM | POA: Diagnosis not present

## 2015-03-01 DIAGNOSIS — J479 Bronchiectasis, uncomplicated: Secondary | ICD-10-CM | POA: Insufficient documentation

## 2015-03-01 DIAGNOSIS — R011 Cardiac murmur, unspecified: Secondary | ICD-10-CM | POA: Insufficient documentation

## 2015-03-01 HISTORY — DX: Dysphagia, unspecified: R13.10

## 2015-03-01 LAB — PULMONARY FUNCTION TEST
DL/VA % pred: 91 %
DL/VA: 3.72 ml/min/mmHg/L
DLCO UNC: 12.57 ml/min/mmHg
DLCO unc % pred: 71 %
FEF 25-75 PRE: 1.35 L/s
FEF 25-75 Post: 1.69 L/sec
FEF2575-%Change-Post: 25 %
FEF2575-%PRED-PRE: 126 %
FEF2575-%Pred-Post: 157 %
FEV1-%CHANGE-POST: 3 %
FEV1-%Pred-Post: 106 %
FEV1-%Pred-Pre: 103 %
FEV1-PRE: 1.49 L
FEV1-Post: 1.55 L
FEV1FVC-%Change-Post: 5 %
FEV1FVC-%Pred-Pre: 105 %
FEV6-%Change-Post: -1 %
FEV6-%PRED-POST: 101 %
FEV6-%Pred-Pre: 103 %
FEV6-POST: 1.88 L
FEV6-Pre: 1.91 L
FEV6FVC-%CHANGE-POST: 0 %
FEV6FVC-%PRED-PRE: 106 %
FEV6FVC-%Pred-Post: 106 %
FVC-%Change-Post: -1 %
FVC-%Pred-Post: 95 %
FVC-%Pred-Pre: 96 %
FVC-POST: 1.88 L
FVC-Pre: 1.91 L
POST FEV1/FVC RATIO: 82 %
PRE FEV1/FVC RATIO: 78 %
Post FEV6/FVC ratio: 100 %
Pre FEV6/FVC Ratio: 100 %
RV % pred: 80 %
RV: 1.73 L
TLC % PRED: 84 %
TLC: 3.65 L

## 2015-03-01 MED ORDER — ALBUTEROL SULFATE (2.5 MG/3ML) 0.083% IN NEBU: 2.5000 mg | INHALATION_SOLUTION | Freq: Once | RESPIRATORY_TRACT | Status: DC

## 2015-03-01 NOTE — Patient Instructions (Signed)
Will schedule for barium swallow to evaluate your esophagus.  Will call you with results. If you get sick with a chest cold, let us know and we will arrange to get another sputum culture Will see you back in 4mos to see Dr. Delton CoombesByrum.

## 2015-03-01 NOTE — Progress Notes (Signed)
   Subjective:    Patient ID: Stephanie Clay, female    DOB: Dec 29, 1931, 81 y.o.   MRN: [id number]  HPI Patient comes in today for follow-up of her recent pulmonary function studies. She has known bronchiectasis, with abnormal findings on chest CT that suggests MAC colonization. Her sputum culture was negative, but her AFB culture is pending. She has had recent PFTs that showed no airflow obstruction. I have reviewed her cultures and PFTs with her, and answered all of her questions. She is continuing to have issues with dysphasia, and will need to have this evaluated.   Review of Systems  Constitutional: Negative for fever and unexpected weight change.  HENT: Negative for congestion, dental problem, ear pain, nosebleeds, postnasal drip, rhinorrhea, sinus pressure, sneezing, sore throat and trouble swallowing.   Eyes: Negative for redness and itching.  Respiratory: Positive for shortness of breath. Negative for cough, chest tightness and wheezing.   Cardiovascular: Negative for palpitations and leg swelling.  Gastrointestinal: Negative for nausea and vomiting.  Genitourinary: Negative for dysuria.  Musculoskeletal: Negative for joint swelling.  Skin: Negative for rash.  Neurological: Negative for headaches.  Hematological: Does not bruise/bleed easily.  Psychiatric/Behavioral: Negative for dysphoric mood. The patient is not nervous/anxious.        Objective:   Physical Exam Thin female in no acute distress Nose without purulence or discharge noted Neck without lymphadenopathy or thyromegaly Chest with a few scattered crackles, no wheezing Cardiac exam with regular rate and rhythm Lower extremities without edema, no cyanosis Alert and oriented, moves all 4 extremities.       Assessment & Plan:

## 2015-03-01 NOTE — Assessment & Plan Note (Signed)
The patient is stable from a bronchiectasis standpoint, with very little pulmonary symptoms currently.. Culture was unremarkable, and her AFB culture is pending. I suspect she is colonized with MAC, given her CT chest. She has lost some weight, but also has issues with dysphagia. I think she needs a barium swallow to make sure she does not have esophageal pathology, and also to see if she may be having some reflux which may explain her underlying bronchiectasis. At least, she does not have airflow obstruction by her PFTs. I stressed to her the importance of culturing her underlying flora so that we can utilize appropriate antibiotics. If she develops a chest infection, she is to call us so that we can get a sputum culture.

## 2015-03-03 DIAGNOSIS — K219 Gastro-esophageal reflux disease without esophagitis: Secondary | ICD-10-CM | POA: Diagnosis not present

## 2015-03-03 DIAGNOSIS — K449 Diaphragmatic hernia without obstruction or gangrene: Secondary | ICD-10-CM | POA: Diagnosis not present

## 2015-03-03 DIAGNOSIS — R1310 Dysphagia, unspecified: Secondary | ICD-10-CM | POA: Diagnosis not present

## 2015-03-04 DIAGNOSIS — R1031 Right lower quadrant pain: Secondary | ICD-10-CM | POA: Diagnosis not present

## 2015-03-04 DIAGNOSIS — E876 Hypokalemia: Secondary | ICD-10-CM | POA: Diagnosis not present

## 2015-03-04 DIAGNOSIS — E875 Hyperkalemia: Secondary | ICD-10-CM | POA: Diagnosis not present

## 2015-03-04 DIAGNOSIS — N390 Urinary tract infection, site not specified: Secondary | ICD-10-CM | POA: Diagnosis not present

## 2015-03-07 ENCOUNTER — Telehealth: Payer: Self-pay | Admitting: Pulmonary Disease

## 2015-03-07 DIAGNOSIS — K219 Gastro-esophageal reflux disease without esophagitis: Secondary | ICD-10-CM

## 2015-03-07 NOTE — Telephone Encounter (Signed)
Patient notified.  Order for GI referral entered.  Patient [facility] be [facility] at [facility].  Nothing further needed.

## 2015-03-07 NOTE — Telephone Encounter (Signed)
Please let pt know that her swallowing study does not show a mass or blockage.  But there is some reflux, and a quesiton of inflammation in the top part of the esophagus.  I think she needs to see a GI doctor.  [place] for [place] in [place] or closer to where she lives.  Let me know what she decides, and where she would like to be seen.

## 2015-03-09 ENCOUNTER — Encounter: Payer: Self-pay | Admitting: Pulmonary Disease

## 2015-03-09 DIAGNOSIS — R404 Transient alteration of awareness: Secondary | ICD-10-CM | POA: Diagnosis not present

## 2015-03-09 DIAGNOSIS — T40601A Poisoning by unspecified narcotics, accidental (unintentional), initial encounter: Secondary | ICD-10-CM | POA: Diagnosis not present

## 2015-03-09 DIAGNOSIS — R918 Other nonspecific abnormal finding of lung field: Secondary | ICD-10-CM | POA: Diagnosis not present

## 2015-03-09 DIAGNOSIS — G311 Senile degeneration of brain, not elsewhere classified: Secondary | ICD-10-CM | POA: Diagnosis not present

## 2015-03-09 DIAGNOSIS — R531 Weakness: Secondary | ICD-10-CM | POA: Diagnosis not present

## 2015-03-09 DIAGNOSIS — T402X1A Poisoning by other opioids, accidental (unintentional), initial encounter: Secondary | ICD-10-CM | POA: Diagnosis not present

## 2015-03-09 DIAGNOSIS — A419 Sepsis, unspecified organism: Secondary | ICD-10-CM | POA: Diagnosis not present

## 2015-03-09 DIAGNOSIS — R4182 Altered mental status, unspecified: Secondary | ICD-10-CM | POA: Diagnosis not present

## 2015-03-16 DIAGNOSIS — M545 Low back pain: Secondary | ICD-10-CM | POA: Diagnosis not present

## 2015-03-16 DIAGNOSIS — T50901D Poisoning by unspecified drugs, medicaments and biological substances, accidental (unintentional), subsequent encounter: Secondary | ICD-10-CM | POA: Diagnosis not present

## 2015-03-16 DIAGNOSIS — F5101 Primary insomnia: Secondary | ICD-10-CM | POA: Diagnosis not present

## 2015-03-30 DIAGNOSIS — R1310 Dysphagia, unspecified: Secondary | ICD-10-CM | POA: Diagnosis not present

## 2015-04-05 LAB — AFB CULTURE WITH SMEAR (NOT AT ARMC): Acid Fast Smear: NONE SEEN

## 2015-04-19 DIAGNOSIS — I8393 Asymptomatic varicose veins of bilateral lower extremities: Secondary | ICD-10-CM | POA: Diagnosis not present

## 2015-04-19 DIAGNOSIS — F5101 Primary insomnia: Secondary | ICD-10-CM | POA: Diagnosis not present

## 2015-04-19 DIAGNOSIS — R5383 Other fatigue: Secondary | ICD-10-CM | POA: Diagnosis not present

## 2015-04-19 DIAGNOSIS — I10 Essential (primary) hypertension: Secondary | ICD-10-CM | POA: Diagnosis not present

## 2015-04-19 DIAGNOSIS — E539 Vitamin B deficiency, unspecified: Secondary | ICD-10-CM | POA: Diagnosis not present

## 2015-04-19 DIAGNOSIS — M545 Low back pain: Secondary | ICD-10-CM | POA: Diagnosis not present

## 2015-05-05 ENCOUNTER — Other Ambulatory Visit: Payer: Self-pay

## 2015-05-05 DIAGNOSIS — I83813 Varicose veins of bilateral lower extremities with pain: Secondary | ICD-10-CM

## 2015-05-12 DIAGNOSIS — R130 Aphagia: Secondary | ICD-10-CM | POA: Diagnosis not present

## 2015-05-12 DIAGNOSIS — K210 Gastro-esophageal reflux disease with esophagitis: Secondary | ICD-10-CM | POA: Diagnosis not present

## 2015-05-19 DIAGNOSIS — E039 Hypothyroidism, unspecified: Secondary | ICD-10-CM | POA: Diagnosis not present

## 2015-05-19 DIAGNOSIS — Q393 Congenital stenosis and stricture of esophagus: Secondary | ICD-10-CM | POA: Diagnosis not present

## 2015-05-19 DIAGNOSIS — G4700 Insomnia, unspecified: Secondary | ICD-10-CM | POA: Diagnosis not present

## 2015-05-19 DIAGNOSIS — J45909 Unspecified asthma, uncomplicated: Secondary | ICD-10-CM | POA: Diagnosis not present

## 2015-05-19 DIAGNOSIS — M479 Spondylosis, unspecified: Secondary | ICD-10-CM | POA: Diagnosis not present

## 2015-05-19 DIAGNOSIS — K210 Gastro-esophageal reflux disease with esophagitis: Secondary | ICD-10-CM | POA: Diagnosis not present

## 2015-05-19 DIAGNOSIS — K219 Gastro-esophageal reflux disease without esophagitis: Secondary | ICD-10-CM | POA: Diagnosis not present

## 2015-05-19 DIAGNOSIS — E785 Hyperlipidemia, unspecified: Secondary | ICD-10-CM | POA: Diagnosis not present

## 2015-05-19 DIAGNOSIS — F329 Major depressive disorder, single episode, unspecified: Secondary | ICD-10-CM | POA: Diagnosis not present

## 2015-05-19 DIAGNOSIS — K449 Diaphragmatic hernia without obstruction or gangrene: Secondary | ICD-10-CM | POA: Diagnosis not present

## 2015-05-19 DIAGNOSIS — R634 Abnormal weight loss: Secondary | ICD-10-CM | POA: Diagnosis not present

## 2015-05-19 DIAGNOSIS — D509 Iron deficiency anemia, unspecified: Secondary | ICD-10-CM | POA: Diagnosis not present

## 2015-05-19 DIAGNOSIS — R1310 Dysphagia, unspecified: Secondary | ICD-10-CM | POA: Diagnosis not present

## 2015-05-19 DIAGNOSIS — I10 Essential (primary) hypertension: Secondary | ICD-10-CM | POA: Diagnosis not present

## 2015-05-19 DIAGNOSIS — K222 Esophageal obstruction: Secondary | ICD-10-CM | POA: Diagnosis not present

## 2015-05-19 DIAGNOSIS — K2960 Other gastritis without bleeding: Secondary | ICD-10-CM | POA: Diagnosis not present

## 2015-05-19 DIAGNOSIS — K2970 Gastritis, unspecified, without bleeding: Secondary | ICD-10-CM | POA: Diagnosis not present

## 2015-05-19 DIAGNOSIS — M1990 Unspecified osteoarthritis, unspecified site: Secondary | ICD-10-CM | POA: Diagnosis not present

## 2015-05-19 DIAGNOSIS — F419 Anxiety disorder, unspecified: Secondary | ICD-10-CM | POA: Diagnosis not present

## 2015-05-24 DIAGNOSIS — E539 Vitamin B deficiency, unspecified: Secondary | ICD-10-CM | POA: Diagnosis not present

## 2015-06-20 DIAGNOSIS — I8393 Asymptomatic varicose veins of bilateral lower extremities: Secondary | ICD-10-CM | POA: Diagnosis not present

## 2015-06-20 DIAGNOSIS — Z23 Encounter for immunization: Secondary | ICD-10-CM | POA: Diagnosis not present

## 2015-06-20 DIAGNOSIS — R1319 Other dysphagia: Secondary | ICD-10-CM | POA: Diagnosis not present

## 2015-07-14 ENCOUNTER — Encounter: Payer: Self-pay | Admitting: Emergency Medicine

## 2015-07-14 ENCOUNTER — Ambulatory Visit (INDEPENDENT_AMBULATORY_CARE_PROVIDER_SITE_OTHER): Payer: Medicare Other | Admitting: Emergency Medicine

## 2015-07-14 VITALS — BP 130/62 | HR 63 | Ht 59.0 in | Wt 94.0 lb

## 2015-07-14 DIAGNOSIS — J479 Bronchiectasis, uncomplicated: Secondary | ICD-10-CM

## 2015-07-14 NOTE — Progress Notes (Signed)
Subjective:    Patient ID: Stephanie Clay, female    DOB: 1931/12/13, 81 y.o.   MRN: [id number]  HPI 81 year old woman, former tobacco, has been followed by Dr. Shelle Iron for bronchiectasis and abnormal CT chest. There is been some question of dysphagia and possible aspiration.  She has been dealing with fatigue, had also had cough that has improved significantly. Her functional capacity is better.   She underwent pulmonary function testing in May 2016 that I personally reviewed. This showed grossly normal airflows without a bronchodilator response, normal lung volumes and a decreased DLCO that corrects to normal range for alveolar volume.. 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 She did have a small sliding hiatal hernia and mild esophageal reflux. Finally she had sputum cultures and AFB performed in May that showed normal flora and were negative for AFB. She underwent endoscopy that confirmed no obstructing lesions, Dr Chales Abrahams in [place].   Personal reviewed her CT scan from 01/15/15 at [facility].                                                                               Review of Systems As per HPI  Past Medical History  Diagnosis Date  . Hyperlipemia   . Gastro-esophageal reflux   . Chronic chest pain   . Secondary hyperparathyroidism   . Iron (Fe) deficiency anemia   . Pneumathemia      Family History  Problem Relation Age of Onset  . Emphysema Father   . Heart disease Mother   . Cancer Sister      Social History   Social History  . Marital Status: Married    Spouse Name: carl  . Number of Children: 3  . Years of Education: 9   Occupational History  . retired    Social History Main Topics  . Smoking status: Former Smoker -- 1 years  . Smokeless tobacco: Not on file     Comment: socially smoked around age 30 x 1  year (did not smoke daily)   . Alcohol Use: No  . Drug Use: No  . Sexual Activity: Not on file   Other Topics Concern  . Not on file   Social History Narrative     No Known Allergies   Outpatient Prescriptions Prior to Visit  Medication Sig Dispense Refill  . ALPRAZolam (XANAX) 0.5 MG tablet Take 0.5 mg by mouth 2 (two) times daily as needed for anxiety.    Marland Kitchen aspirin 81 MG tablet Take 81 mg by mouth daily.    Marland Kitchen atenolol-chlorthalidone (TENORETIC) 100-25 MG per tablet Take 1 tablet by mouth daily.    Marland Kitchen dicyclomine (BENTYL) 10 MG capsule Take 10 mg by mouth 4 (four) times daily -  before meals and at bedtime.    . furosemide (LASIX) 40 MG tablet Take 40 mg by mouth daily.    Marland Kitchen gabapentin (NEURONTIN) 100 MG capsule Take 100 mg by mouth at bedtime.    Marland Kitchen HYDROcodone-acetaminophen (NORCO) 7.5-325 MG per tablet Take 1 tablet by mouth every 6 (six) hours  as needed for moderate pain.    . hydrOXYzine (ATARAX/VISTARIL) 25 MG tablet Take 25 mg by mouth 3 (three) times daily as needed.    Marland Kitchen levothyroxine (SYNTHROID, LEVOTHROID) 50 MCG tablet Take 50 mcg by mouth daily before breakfast.    . pantoprazole (PROTONIX) 40 MG tablet Take 40 mg by mouth daily.    . potassium chloride SA (K-DUR,KLOR-CON) 20 MEQ tablet Take 40 mEq by mouth 2 (two) times daily.     . simvastatin (ZOCOR) 40 MG tablet Take 40 mg by mouth daily. Take 2 tablets by mouth daily    . triamcinolone cream (KENALOG) 0.1 % Apply 1 application topically 2 (two) times daily.    Marland Kitchen amitriptyline (ELAVIL) 25 MG tablet Take 25 mg by mouth at bedtime.    . fluticasone (FLONASE) 50 MCG/ACT nasal spray Place 2 sprays into both nostrils as needed.      No facility-administered medications prior to visit.         Objective:   Physical Exam Filed Vitals:   07/14/15 0905  BP: 130/62  Pulse: 63  Height:  (1.499 m)  Weight: 94 lb (42.638 kg)  SpO2: 97%   Gen: pleasant thin elderly woman, in no distress,  normal affect  ENT:  No lesions,  mouth clear,  oropharynx clear, no postnasal drip  Neck: No JVD, no TMG, no carotid bruits  Lungs: No use of accessory muscles, clear without rales or rhonchi  Cardiovascular: RRR, heart sounds normal, no murmur or gallops, no peripheral edema  Musculoskeletal: No deformities, no cyanosis or clubbing  Neuro: alert, non focal  Skin: Warm, no lesions or rashes      Assessment & Plan:  Bronchiectasis without acute exacerbation Bronchiectasis by CT scan from March 2016 associated with micronodular disease and very suspicious for mycobacterial disease. Culture negative on sputum. Her cough is quiet right now she does not have daily symptoms that require chronic bronchiectasis management. We discussed the pros and cons of watchful waiting versus bronchoscopy for culture. At this point we will follow, plan to repeat her CT scan of the chest next March 2017, and collect sputum if she has a flare. If symptoms change then I will push for bronchoscopy

## 2015-07-14 NOTE — Patient Instructions (Signed)
We will plan to repeat your CT scan of the chest in March 2017 at [facility] If your cough worsens, or if you develop a cold or bronchitis, we would like to get a sputum sample.  Follow with Dr Delton Coombes in March to review the CT scan, or sooner if you have new symptoms.

## 2015-07-14 NOTE — Assessment & Plan Note (Signed)
Bronchiectasis by CT scan from March 2016 associated with micronodular disease and very suspicious for mycobacterial disease. Culture negative on sputum. Her cough is quiet right now she does not have daily symptoms that require chronic bronchiectasis management. We discussed the pros and cons of watchful waiting versus bronchoscopy for culture. At this point we will follow, plan to repeat her CT scan of the chest next March 2017, and collect sputum if she has a flare. If symptoms change then I will push for bronchoscopy

## 2015-07-14 NOTE — Addendum Note (Signed)
Addended by: Karalee Height on: 07/14/2015 11:28 AM   Modules accepted: Orders

## 2015-08-09 ENCOUNTER — Encounter: Payer: Self-pay | Admitting: Vascular Surgery

## 2015-08-11 ENCOUNTER — Ambulatory Visit (INDEPENDENT_AMBULATORY_CARE_PROVIDER_SITE_OTHER): Payer: Medicare Other | Admitting: Vascular Surgery

## 2015-08-11 ENCOUNTER — Encounter: Payer: Self-pay | Admitting: Vascular Surgery

## 2015-08-11 ENCOUNTER — Ambulatory Visit (HOSPITAL_COMMUNITY)
Admission: RE | Admit: 2015-08-11 | Discharge: 2015-08-11 | Disposition: A | Discharge status: Home / Self Care | Payer: Medicare Other | Source: Ambulatory Visit | Attending: Vascular Surgery | Admitting: Vascular Surgery

## 2015-08-11 VITALS — BP 154/66 | HR 54 | Temp 97.9°F | Resp 16 | Ht 59.0 in | Wt 96.0 lb

## 2015-08-11 DIAGNOSIS — I83813 Varicose veins of bilateral lower extremities with pain: Secondary | ICD-10-CM | POA: Insufficient documentation

## 2015-08-11 DIAGNOSIS — E785 Hyperlipidemia, unspecified: Secondary | ICD-10-CM | POA: Diagnosis not present

## 2015-08-11 NOTE — Progress Notes (Signed)
Referred by:  Blane Ohara, MD [address] [place], [place] [address]  Reason for referral: Leg pain   History of Present Illness  Stephanie Clay is a 81 y.o. (1932/08/07) female who presents for evaluation of bilateral leg pain. She describes pain in her legs from the hip down to her feet. This has been present for over two years. She denies any swelling. Her pain is constant but is worse after walking around all day. The patient denies any claudication or rest pain symptoms. She denies any history of venous stasis ulcers. She denies history of DVT. The patient is particularly concerned about the appearance of prominent veins on her feet and an itchy intermittent rash on her legs. She has used compression stockings in the past. This minimally improves her symptoms. She has also tried over the counter topical creams which have not been helpful.   She is active and is busy daily with household activities. She states that the pain bothers her at night. She reports having chronic back pain.   She has a past medical history of hyperlipidemia and hypertension, both of which are stable. She is not a smoker.   Past Medical History  Diagnosis Date  . Hyperlipemia   . Gastro-esophageal reflux   . Chronic chest pain   . Secondary hyperparathyroidism (HCC)   . Iron (Fe) deficiency anemia   . Pneumathemia (HCC)   . Asthma   . Varicose veins     Bilateral leg    Past Surgical History  Procedure Laterality Date  . Abdominal hysterectomy    . Salpingoophorectomy    . Cystocele repair      Social History   Social History  . Marital Status: Married    Spouse Name: carl  . Number of Children: 3  . Years of Education: 9   Occupational History  . retired    Social History Main Topics  . Smoking status: Former Smoker -- 1 years  . Smokeless tobacco: Former Neurosurgeon     Comment: socially smoked around age 28 x 1 year (did not smoke daily)   . Alcohol Use: No  . Drug Use: No  . Sexual  Activity: Not on file   Other Topics Concern  . Not on file   Social History Narrative    Family History  Problem Relation Age of Onset  . Emphysema Father   . Heart disease Mother   . Cancer Sister   . Heart disease Sister   . Hypertension Sister   . Varicose Veins Sister   . Hyperlipidemia Daughter   . Hypertension Daughter      Current Outpatient Prescriptions on File Prior to Visit  Medication Sig Dispense Refill  . ALPRAZolam (XANAX) 0.5 MG tablet Take 0.5 mg by mouth 2 (two) times daily as needed for anxiety.    Marland Kitchen aspirin 81 MG tablet Take 81 mg by mouth daily.    Marland Kitchen atenolol-chlorthalidone (TENORETIC) 100-25 MG per tablet Take 1 tablet by mouth daily.    Marland Kitchen dicyclomine (BENTYL) 10 MG capsule Take 10 mg by mouth 4 (four) times daily -  before meals and at bedtime.    . fluticasone (FLONASE) 50 MCG/ACT nasal spray Place 2 sprays into both nostrils as needed.     . furosemide (LASIX) 40 MG tablet Take 40 mg by mouth daily.    Marland Kitchen gabapentin (NEURONTIN) 100 MG capsule Take 100 mg by mouth at bedtime.    Marland Kitchen HYDROcodone-acetaminophen (NORCO) 7.5-325 MG  per tablet Take 1 tablet by mouth every 6 (six) hours as needed for moderate pain.    . hydrOXYzine (ATARAX/VISTARIL) 25 MG tablet Take 25 mg by mouth 3 (three) times daily as needed.    Marland Kitchen levothyroxine (SYNTHROID, LEVOTHROID) 50 MCG tablet Take 50 mcg by mouth daily before breakfast.    . pantoprazole (PROTONIX) 40 MG tablet Take 40 mg by mouth daily.    . potassium chloride SA (K-DUR,KLOR-CON) 20 MEQ tablet Take 40 mEq by mouth 2 (two) times daily.     . simvastatin (ZOCOR) 40 MG tablet Take 40 mg by mouth daily. Take 2 tablets by mouth daily    . triamcinolone cream (KENALOG) 0.1 % Apply 1 application topically 2 (two) times daily.    Marland Kitchen amitriptyline (ELAVIL) 25 MG tablet Take 25 mg by mouth at bedtime.     No current facility-administered medications on file prior to visit.    Allergies  Allergen Reactions  . Asa [Aspirin]  Itching and Other (See Comments)    And burning sensation      REVIEW OF SYSTEMS:  (Positives checked otherwise negative)  CARDIOVASCULAR:   chest pain,  chest pressure,  palpitations,  shortness of breath when laying flat,  shortness of breath with exertion,   pain in legs when walking,  pain in feet when laying flat,  history of blood clot in veins (DVT),  history of phlebitis,  swelling in legs,  varicose veins  PULMONARY:   productive cough,  asthma,  wheezing  NEUROLOGIC:   weakness in legs,  numbness in arms or legs,  difficulty speaking or slurred speech,  temporary loss of vision in one eye,  dizziness  HEMATOLOGIC:   bleeding problems,  problems with blood clotting too easily  MUSCULOSKEL:   joint pain,  joint swelling  GASTROINTEST:   vomiting blood,  blood in stool     GENITOURINARY:   burning with urination,  blood in urine  PSYCHIATRIC:   history of major depression  INTEGUMENTARY:   rashes,  ulcers  CONSTITUTIONAL:   fever,  chills   Physical Examination Filed Vitals:   08/11/15 1311 08/11/15 1320  BP: 174/68 154/66  Pulse: 51 54  Temp: 97.9 F (36.6 C)   TempSrc: Oral   Resp: 16   Height:  (1.499 m)   Weight: 96 lb (43.545 kg)   SpO2: 100%    Body mass index is 19.38 kg/(m^2).  General: A&O x 3, WDWN female in NAD  Pulmonary: Sym exp, good air movt, CTAB, no rales, rhonchi, & wheezing  Cardiac: RRR, Nl S1, S2, no Murmurs, rubs or gallops, no carotid bruits.   Vascular: Palpable femoral and dorsalis pedis pulses. Diffuse prominent veins of feet bilaterally and reticular veins of legs bilaterally. Multiple bruises of legs bilaterally. Feet are purple. No swelling noted. No venous stasis changes. No venous stasis ulcers.   Musculoskeletal: Extremities without ischemic changes.   Neurologic: CN 2-12 grossly intact.   Psychiatric: Judgment intact, Mood & affect appropriate for  pt's clinical situation  Dermatologic: erythematous macular rash to right knee and thigh. Similar rash to left thigh.   Non-Invasive Vascular Imaging  BLE Venous Insufficiency Duplex (Date: 08/11/2015):   RLE: negative DVT, positive GSV reflux (0.15cm to 0.25cm), postive deep venous reflux  LLE: negative, postive GSV reflux (0.12cm to 0.31 cm), postive deep venous reflux  Outside Studies/Documentation 4 pages of outside documents were reviewed including: medical records from PCP.  Medical Decision Making  Pincus Sanes  Vedia Pereyraerdue is a 81 y.o. female who presents with bilateral mild lower extremity chronic venous insufficiency, bilateral leg pain and pruritic rash of legs bilaterally   Her venous insufficiency studies reveal both superificial and deep venous reflux. Her great saphenous veins are minimally dilated. She is not a candidate for venous ablation given minimally dilated great saphenous veins. Even with treatment, this is unlikely to resolve her leg pain as the pain is likely a combination of venous insufficiency and secondary to back pain. Discussed use of compression stockings for symptom management. Recommended follow-up with her PCP for further evaluation of her back pain. Also discussed she follow-up with her PCP regarding her leg rash. This may require referral to dermatology. She has excellent arterial flow to her legs on examination today. She will follow up on an as needed basis.   Maris BergerKimberly Trinh, PA-C [facility] Office: [phone] Pager: [phone]  08/11/2015, 1:55 PM  This patient was seen and examined in conjunction with Dr. Darrick PennaFields   History and exam findings as above. The patient has bilateral lower extremity pain. This is most likely multifactorial. Most of her pain seems to radiate from the hip all the way down to the foot bilaterally. This would not be consistent with venous disease. This most likely is related to her back. I discussed  this with the patient today. She does have some superficial varicosities in both lower extremities. However she did not really have significant reflux on her noninvasive venous duplex today. Best treatment for her varicosities is going to be compression therapy. She was not interested in any sclerotherapy or obliteration of the veins for cosmetic purposes.  She will follow-up with me on an as-needed basis. I discussed with her talking with her primary care physician if her legs continue to hurt to consider evaluation of her back as a source of this. I also encouraged her to continue with her physical therapy as this would be good for a back origin of leg pain as well.  Fabienne Brunsharles Allysha Tryon, MD [facility] Office: [phone] Pager: [phone]

## 2015-08-11 NOTE — Progress Notes (Signed)
Filed Vitals:   08/11/15 1311 08/11/15 1320  BP: 174/68 154/66  Pulse: 51 54  Temp: 97.9 F (36.6 C)   TempSrc: Oral   Resp: 16   Height: 4\' 11"  (1.499 m)   Weight: 96 lb (43.545 kg)   SpO2: 100%

## 2015-08-26 DIAGNOSIS — M79671 Pain in right foot: Secondary | ICD-10-CM | POA: Diagnosis not present

## 2015-09-06 DIAGNOSIS — M79671 Pain in right foot: Secondary | ICD-10-CM | POA: Diagnosis not present

## 2015-09-06 DIAGNOSIS — L308 Other specified dermatitis: Secondary | ICD-10-CM | POA: Diagnosis not present

## 2015-09-06 DIAGNOSIS — L03129 Acute lymphangitis of unspecified part of limb: Secondary | ICD-10-CM | POA: Diagnosis not present

## 2015-09-06 DIAGNOSIS — L03115 Cellulitis of right lower limb: Secondary | ICD-10-CM | POA: Diagnosis not present

## 2015-09-21 DIAGNOSIS — M65312 Trigger thumb, left thumb: Secondary | ICD-10-CM | POA: Diagnosis not present

## 2015-09-21 DIAGNOSIS — I10 Essential (primary) hypertension: Secondary | ICD-10-CM | POA: Diagnosis not present

## 2015-09-21 DIAGNOSIS — E782 Mixed hyperlipidemia: Secondary | ICD-10-CM | POA: Diagnosis not present

## 2015-09-21 DIAGNOSIS — E876 Hypokalemia: Secondary | ICD-10-CM | POA: Diagnosis not present

## 2015-09-22 DIAGNOSIS — I10 Essential (primary) hypertension: Secondary | ICD-10-CM | POA: Diagnosis not present

## 2015-09-22 DIAGNOSIS — E782 Mixed hyperlipidemia: Secondary | ICD-10-CM | POA: Diagnosis not present

## 2015-09-27 DIAGNOSIS — L300 Nummular dermatitis: Secondary | ICD-10-CM | POA: Diagnosis not present

## 2015-09-27 DIAGNOSIS — L299 Pruritus, unspecified: Secondary | ICD-10-CM | POA: Diagnosis not present

## 2015-09-27 DIAGNOSIS — L853 Xerosis cutis: Secondary | ICD-10-CM | POA: Diagnosis not present

## 2015-10-18 DIAGNOSIS — J208 Acute bronchitis due to other specified organisms: Secondary | ICD-10-CM | POA: Diagnosis not present

## 2015-10-18 DIAGNOSIS — N3000 Acute cystitis without hematuria: Secondary | ICD-10-CM | POA: Diagnosis not present

## 2015-11-14 DIAGNOSIS — R233 Spontaneous ecchymoses: Secondary | ICD-10-CM | POA: Diagnosis not present

## 2015-11-14 DIAGNOSIS — L853 Xerosis cutis: Secondary | ICD-10-CM | POA: Diagnosis not present

## 2015-12-14 DIAGNOSIS — J9801 Acute bronchospasm: Secondary | ICD-10-CM | POA: Diagnosis not present

## 2015-12-14 DIAGNOSIS — J208 Acute bronchitis due to other specified organisms: Secondary | ICD-10-CM | POA: Diagnosis not present

## 2015-12-20 DIAGNOSIS — R233 Spontaneous ecchymoses: Secondary | ICD-10-CM | POA: Diagnosis not present

## 2015-12-20 DIAGNOSIS — L300 Nummular dermatitis: Secondary | ICD-10-CM | POA: Diagnosis not present

## 2015-12-20 DIAGNOSIS — L820 Inflamed seborrheic keratosis: Secondary | ICD-10-CM | POA: Diagnosis not present

## 2015-12-22 DIAGNOSIS — I10 Essential (primary) hypertension: Secondary | ICD-10-CM | POA: Diagnosis not present

## 2015-12-22 DIAGNOSIS — M545 Low back pain: Secondary | ICD-10-CM | POA: Diagnosis not present

## 2015-12-22 DIAGNOSIS — J158 Pneumonia due to other specified bacteria: Secondary | ICD-10-CM | POA: Diagnosis not present

## 2015-12-22 DIAGNOSIS — R05 Cough: Secondary | ICD-10-CM | POA: Diagnosis not present

## 2015-12-22 DIAGNOSIS — E038 Other specified hypothyroidism: Secondary | ICD-10-CM | POA: Diagnosis not present

## 2015-12-22 DIAGNOSIS — J9811 Atelectasis: Secondary | ICD-10-CM | POA: Diagnosis not present

## 2015-12-22 DIAGNOSIS — E782 Mixed hyperlipidemia: Secondary | ICD-10-CM | POA: Diagnosis not present

## 2016-01-12 ENCOUNTER — Ambulatory Visit (INDEPENDENT_AMBULATORY_CARE_PROVIDER_SITE_OTHER)
Admission: RE | Admit: 2016-01-12 | Discharge: 2016-01-12 | Disposition: A | Discharge status: Home / Self Care | Payer: Medicare Other | Source: Ambulatory Visit | Attending: Emergency Medicine | Admitting: Emergency Medicine

## 2016-01-12 DIAGNOSIS — J479 Bronchiectasis, uncomplicated: Secondary | ICD-10-CM

## 2016-01-16 ENCOUNTER — Ambulatory Visit: Payer: Medicare Other | Admitting: Emergency Medicine

## 2016-01-19 ENCOUNTER — Encounter: Payer: Self-pay | Admitting: Emergency Medicine

## 2016-01-19 ENCOUNTER — Ambulatory Visit (INDEPENDENT_AMBULATORY_CARE_PROVIDER_SITE_OTHER): Payer: Medicare Other | Admitting: Emergency Medicine

## 2016-01-19 VITALS — BP 146/76 | HR 55 | Ht 59.0 in | Wt 97.2 lb

## 2016-01-19 DIAGNOSIS — J479 Bronchiectasis, uncomplicated: Secondary | ICD-10-CM | POA: Diagnosis not present

## 2016-01-19 NOTE — Patient Instructions (Signed)
Your CT scan of the chest is improved compared with your prior study in 12/2014. There is some inflammation present but it is better.  We will repeat your CT chest in 1 year. If you develop new symptoms then we will consider doing a scan sooner.  Follow with Dr Delton CoombesByrum in 12 months or sooner if you have any problems

## 2016-01-19 NOTE — Progress Notes (Signed)
Subjective:    Patient ID: Stephanie Clay, female    DOB: 03-03-1932, 82 y.o.   MRN: [id number]  HPI 82 year old woman, former tobacco, has been followed by Dr. Shelle Iron for bronchiectasis and abnormal CT chest. There is been some question of dysphagia and possible aspiration.  She has been dealing with fatigue, had also had cough that has improved significantly. Her functional capacity is better.   She underwent pulmonary function testing in May 2016 that I personally reviewed. This showed grossly normal airflows without a bronchodilator response, normal lung volumes and a decreased DLCO that corrects to normal range for alveolar volume.. She had a modified barium swallow at [facility] on 03/03/15 that showed termination of primary peristalsis in the mid thoracic esophagus, contrast stasis without any apparent mass or stricture. She did have a small sliding hiatal hernia and mild esophageal reflux. Finally she had sputum cultures and AFB performed in May that showed normal flora and were negative for AFB. She underwent endoscopy that confirmed no obstructing lesions, Dr Chales Abrahams in [place].   Personal reviewed her CT scan from 01/15/15 at [facility].               ROV 01/19/16 --   Follow-up visit for history of bronchiectasis and a CT scan with a micronodular pattern that is most suggestive of mycobacterial disease. Her sputum samples have been negative for mycobacterial disease. At our last visit in September she was not having any active symptoms and we made the decision to defer bronchoscopy and to repeat her CT scan of the chest. This was done on 01/12/16 and I have reviewed the films. There has been interval improvement in her bronchiectasis and micronodular changes compared with 01/15/15 at [place].   She does have some cough, rhinitis. She is on flonase,    Review of Systems As per HPI  Past Medical History  Diagnosis Date  . Hyperlipemia   . Gastro-esophageal reflux   . Chronic chest  pain   . Secondary hyperparathyroidism (HCC)   . Iron (Fe) deficiency anemia   . Pneumathemia (HCC)   . Asthma   . Varicose veins     Bilateral leg     Family History  Problem Relation Age of Onset  . Emphysema Father   . Heart disease Mother   . Cancer Sister   . Heart disease Sister   . Hypertension Sister   . Varicose Veins Sister   . Hyperlipidemia Daughter   . Hypertension Daughter      Social History   Social History  . Marital Status: Married    Spouse Name: carl  . Number of Children: 3  . Years of Education: 9   Occupational History  . retired    Social History Main Topics  . Smoking status: Former Smoker -- 1 years  . Smokeless tobacco: Former Neurosurgeon     Comment: socially smoked around age 43 x 1 year (did not smoke daily)   . Alcohol Use: No  . Drug Use: No  . Sexual Activity: Not on file   Other Topics Concern  . Not on file   Social History Narrative     Allergies  Allergen Reactions  . Asa [Aspirin] Itching and Other (See Comments)    And burning sensation     Outpatient Prescriptions Prior to Visit  Medication Sig Dispense Refill  . ALPRAZolam (XANAX) 0.5 MG tablet Take 0.5 mg by mouth 2 (two) times daily as needed for anxiety.    Marland Kitchen  amitriptyline (ELAVIL) 25 MG tablet Take 25 mg by mouth at bedtime.    Marland Kitchen. aspirin 81 MG tablet Take 81 mg by mouth daily.    Marland Kitchen. atenolol-chlorthalidone (TENORETIC) 100-25 MG per tablet Take 1 tablet by mouth daily.    Marland Kitchen. dicyclomine (BENTYL) 10 MG capsule Take 10 mg by mouth 4 (four) times daily -  before meals and at bedtime.    . fluticasone (FLONASE) 50 MCG/ACT nasal spray Place 2 sprays into both nostrils as needed.     . furosemide (LASIX) 40 MG tablet Take 40 mg by mouth daily.    Marland Kitchen. gabapentin (NEURONTIN) 100 MG capsule Take 100 mg by mouth at bedtime.    Marland Kitchen. HYDROcodone-acetaminophen (NORCO) 7.5-325 MG per tablet Take 1 tablet by mouth every 6 (six) hours as needed for moderate pain. Pt. Takes 5mg     .  hydrOXYzine (ATARAX/VISTARIL) 25 MG tablet Take 25 mg by mouth 3 (three) times daily as needed.    Marland Kitchen. levothyroxine (SYNTHROID, LEVOTHROID) 50 MCG tablet Take 50 mcg by mouth daily before breakfast.    . pantoprazole (PROTONIX) 40 MG tablet Take 40 mg by mouth daily.    . potassium chloride SA (K-DUR,KLOR-CON) 20 MEQ tablet Take 40 mEq by mouth 2 (two) times daily.     . simvastatin (ZOCOR) 40 MG tablet Take 40 mg by mouth daily. Take 2 tablets by mouth daily    . triamcinolone cream (KENALOG) 0.1 % Apply 1 application topically 2 (two) times daily.     No facility-administered medications prior to visit.         Objective:   Physical Exam Filed Vitals:   01/19/16 1519  BP: 146/76  Pulse: 55  Height: 4\' 11"  (1.499 m)  Weight: 97 lb 3.2 oz (44.09 kg)  SpO2: 95%   Gen: pleasant thin elderly woman, in no distress,  normal affect  ENT: No lesions,  mouth clear,  oropharynx clear, no postnasal drip  Neck: No JVD, no TMG, no carotid bruits  Lungs: No use of accessory muscles, clear without rales or rhonchi  Cardiovascular: RRR, heart sounds normal, no murmur or gallops, no peripheral edema  Musculoskeletal: No deformities, no cyanosis or clubbing  Neuro: alert, non focal  Skin: Warm, no lesions or rashes      Assessment & Plan:  Bronchiectasis without acute exacerbation CT scan of the chest performed on 01/12/16 was compared with her old films from 01/15/15. There has been an interval improvement in her micronodular disease. Her bronchiectasis is largely unchanged. Given that she is asymptomatic we will defer bronchoscopy and plan to follow her with a repeat film in 1 year or sooner if she develops symptoms.

## 2016-01-19 NOTE — Addendum Note (Signed)
Addended by: Maxwell MarionBLANKENSHIP, MARGIE A on: 01/19/2016 03:59 PM   Modules accepted: Orders

## 2016-01-19 NOTE — Addendum Note (Signed)
Addended by: Maxwell MarionBLANKENSHIP, [employer] A on: 01/19/2016 03:56 PM   Modules accepted: Orders

## 2016-01-19 NOTE — Assessment & Plan Note (Signed)
CT scan of the chest performed on 01/12/16 was compared with her old films from 01/15/15. There has been an interval improvement in her micronodular disease. Her bronchiectasis is largely unchanged. Given that she is asymptomatic we will defer bronchoscopy and plan to follow her with a repeat film in 1 year or sooner if she develops symptoms.

## 2016-01-26 DIAGNOSIS — R531 Weakness: Secondary | ICD-10-CM | POA: Diagnosis not present

## 2016-01-26 DIAGNOSIS — M79661 Pain in right lower leg: Secondary | ICD-10-CM | POA: Diagnosis not present

## 2016-01-26 DIAGNOSIS — J208 Acute bronchitis due to other specified organisms: Secondary | ICD-10-CM | POA: Diagnosis not present

## 2016-01-26 DIAGNOSIS — N3001 Acute cystitis with hematuria: Secondary | ICD-10-CM | POA: Diagnosis not present

## 2016-01-26 DIAGNOSIS — M79662 Pain in left lower leg: Secondary | ICD-10-CM | POA: Diagnosis not present

## 2016-02-08 DIAGNOSIS — R8299 Other abnormal findings in urine: Secondary | ICD-10-CM | POA: Diagnosis not present

## 2016-03-05 DIAGNOSIS — G603 Idiopathic progressive neuropathy: Secondary | ICD-10-CM | POA: Diagnosis not present

## 2016-03-05 DIAGNOSIS — R531 Weakness: Secondary | ICD-10-CM | POA: Diagnosis not present

## 2016-03-05 DIAGNOSIS — M170 Bilateral primary osteoarthritis of knee: Secondary | ICD-10-CM | POA: Diagnosis not present

## 2016-03-07 DIAGNOSIS — M170 Bilateral primary osteoarthritis of knee: Secondary | ICD-10-CM | POA: Diagnosis not present

## 2016-03-07 DIAGNOSIS — M179 Osteoarthritis of knee, unspecified: Secondary | ICD-10-CM | POA: Diagnosis not present

## 2016-03-07 DIAGNOSIS — M11262 Other chondrocalcinosis, left knee: Secondary | ICD-10-CM | POA: Diagnosis not present

## 2016-03-22 DIAGNOSIS — M170 Bilateral primary osteoarthritis of knee: Secondary | ICD-10-CM | POA: Diagnosis not present

## 2016-03-22 DIAGNOSIS — N3000 Acute cystitis without hematuria: Secondary | ICD-10-CM | POA: Diagnosis not present

## 2016-03-29 DIAGNOSIS — N760 Acute vaginitis: Secondary | ICD-10-CM | POA: Diagnosis not present

## 2016-03-29 DIAGNOSIS — N3000 Acute cystitis without hematuria: Secondary | ICD-10-CM | POA: Diagnosis not present

## 2016-03-29 DIAGNOSIS — R600 Localized edema: Secondary | ICD-10-CM | POA: Diagnosis not present

## 2016-04-07 DIAGNOSIS — E876 Hypokalemia: Secondary | ICD-10-CM | POA: Diagnosis not present

## 2016-04-07 DIAGNOSIS — N390 Urinary tract infection, site not specified: Secondary | ICD-10-CM | POA: Diagnosis not present

## 2016-04-07 DIAGNOSIS — E860 Dehydration: Secondary | ICD-10-CM | POA: Diagnosis not present

## 2016-04-07 DIAGNOSIS — I10 Essential (primary) hypertension: Secondary | ICD-10-CM | POA: Diagnosis not present

## 2016-04-07 DIAGNOSIS — D72829 Elevated white blood cell count, unspecified: Secondary | ICD-10-CM | POA: Diagnosis not present

## 2016-04-07 DIAGNOSIS — R5383 Other fatigue: Secondary | ICD-10-CM | POA: Diagnosis not present

## 2016-04-07 DIAGNOSIS — R531 Weakness: Secondary | ICD-10-CM | POA: Diagnosis not present

## 2016-04-07 DIAGNOSIS — R05 Cough: Secondary | ICD-10-CM | POA: Diagnosis not present

## 2016-04-12 DIAGNOSIS — E876 Hypokalemia: Secondary | ICD-10-CM | POA: Diagnosis not present

## 2016-04-12 DIAGNOSIS — N3001 Acute cystitis with hematuria: Secondary | ICD-10-CM | POA: Diagnosis not present

## 2016-04-12 DIAGNOSIS — M791 Myalgia: Secondary | ICD-10-CM | POA: Diagnosis not present

## 2016-04-12 DIAGNOSIS — R600 Localized edema: Secondary | ICD-10-CM | POA: Diagnosis not present

## 2016-04-30 DIAGNOSIS — R600 Localized edema: Secondary | ICD-10-CM | POA: Diagnosis not present

## 2016-04-30 DIAGNOSIS — J441 Chronic obstructive pulmonary disease with (acute) exacerbation: Secondary | ICD-10-CM | POA: Diagnosis not present

## 2016-04-30 DIAGNOSIS — M545 Low back pain: Secondary | ICD-10-CM | POA: Diagnosis not present

## 2016-05-18 DIAGNOSIS — R600 Localized edema: Secondary | ICD-10-CM | POA: Diagnosis not present

## 2016-05-18 DIAGNOSIS — N3000 Acute cystitis without hematuria: Secondary | ICD-10-CM | POA: Diagnosis not present

## 2016-05-18 DIAGNOSIS — Z79899 Other long term (current) drug therapy: Secondary | ICD-10-CM | POA: Diagnosis not present

## 2016-06-11 DIAGNOSIS — R600 Localized edema: Secondary | ICD-10-CM | POA: Diagnosis not present

## 2016-06-11 DIAGNOSIS — M170 Bilateral primary osteoarthritis of knee: Secondary | ICD-10-CM | POA: Diagnosis not present

## 2016-06-11 DIAGNOSIS — R531 Weakness: Secondary | ICD-10-CM | POA: Diagnosis not present

## 2016-06-11 DIAGNOSIS — N3000 Acute cystitis without hematuria: Secondary | ICD-10-CM | POA: Diagnosis not present

## 2016-06-28 ENCOUNTER — Encounter: Payer: Self-pay | Admitting: Vascular Surgery

## 2016-06-28 DIAGNOSIS — J13 Pneumonia due to Streptococcus pneumoniae: Secondary | ICD-10-CM | POA: Diagnosis not present

## 2016-06-28 DIAGNOSIS — R0602 Shortness of breath: Secondary | ICD-10-CM | POA: Diagnosis not present

## 2016-06-28 DIAGNOSIS — R05 Cough: Secondary | ICD-10-CM | POA: Diagnosis not present

## 2016-06-30 ENCOUNTER — Inpatient Hospital Stay (HOSPITAL_COMMUNITY)
Admission: EM | Admit: 2016-06-30 | Discharge: 2016-07-02 | DRG: 191 | Disposition: A | Discharge status: Home / Self Care | Payer: Medicare Other | Attending: Internal Medicine | Admitting: Internal Medicine

## 2016-06-30 ENCOUNTER — Emergency Department (HOSPITAL_COMMUNITY): Payer: Medicare Other

## 2016-06-30 DIAGNOSIS — F419 Anxiety disorder, unspecified: Secondary | ICD-10-CM | POA: Diagnosis present

## 2016-06-30 DIAGNOSIS — R531 Weakness: Secondary | ICD-10-CM | POA: Diagnosis not present

## 2016-06-30 DIAGNOSIS — R05 Cough: Secondary | ICD-10-CM | POA: Diagnosis not present

## 2016-06-30 DIAGNOSIS — E039 Hypothyroidism, unspecified: Secondary | ICD-10-CM | POA: Diagnosis not present

## 2016-06-30 DIAGNOSIS — R5383 Other fatigue: Secondary | ICD-10-CM | POA: Diagnosis present

## 2016-06-30 DIAGNOSIS — I10 Essential (primary) hypertension: Secondary | ICD-10-CM | POA: Diagnosis present

## 2016-06-30 DIAGNOSIS — N2581 Secondary hyperparathyroidism of renal origin: Secondary | ICD-10-CM | POA: Diagnosis not present

## 2016-06-30 DIAGNOSIS — R630 Anorexia: Secondary | ICD-10-CM | POA: Diagnosis not present

## 2016-06-30 DIAGNOSIS — J471 Bronchiectasis with (acute) exacerbation: Secondary | ICD-10-CM

## 2016-06-30 DIAGNOSIS — R636 Underweight: Secondary | ICD-10-CM | POA: Diagnosis present

## 2016-06-30 DIAGNOSIS — K219 Gastro-esophageal reflux disease without esophagitis: Secondary | ICD-10-CM | POA: Diagnosis not present

## 2016-06-30 DIAGNOSIS — Z66 Do not resuscitate: Secondary | ICD-10-CM | POA: Diagnosis present

## 2016-06-30 DIAGNOSIS — Z7982 Long term (current) use of aspirin: Secondary | ICD-10-CM | POA: Diagnosis not present

## 2016-06-30 DIAGNOSIS — Z79899 Other long term (current) drug therapy: Secondary | ICD-10-CM | POA: Diagnosis not present

## 2016-06-30 DIAGNOSIS — R739 Hyperglycemia, unspecified: Secondary | ICD-10-CM | POA: Diagnosis present

## 2016-06-30 DIAGNOSIS — R54 Age-related physical debility: Secondary | ICD-10-CM | POA: Diagnosis not present

## 2016-06-30 DIAGNOSIS — E785 Hyperlipidemia, unspecified: Secondary | ICD-10-CM | POA: Diagnosis present

## 2016-06-30 DIAGNOSIS — Z681 Body mass index (BMI) 19 or less, adult: Secondary | ICD-10-CM

## 2016-06-30 DIAGNOSIS — J479 Bronchiectasis, uncomplicated: Principal | ICD-10-CM | POA: Diagnosis present

## 2016-06-30 DIAGNOSIS — N179 Acute kidney failure, unspecified: Secondary | ICD-10-CM | POA: Diagnosis present

## 2016-06-30 DIAGNOSIS — D509 Iron deficiency anemia, unspecified: Secondary | ICD-10-CM | POA: Diagnosis not present

## 2016-06-30 DIAGNOSIS — R1310 Dysphagia, unspecified: Secondary | ICD-10-CM

## 2016-06-30 DIAGNOSIS — E876 Hypokalemia: Secondary | ICD-10-CM | POA: Diagnosis present

## 2016-06-30 HISTORY — DX: Bronchiectasis, uncomplicated: J47.9

## 2016-06-30 HISTORY — DX: Acute kidney failure, unspecified: N17.9

## 2016-06-30 HISTORY — DX: Scoliosis, unspecified: M41.9

## 2016-06-30 LAB — CBC WITH DIFFERENTIAL/PLATELET
BASOS PCT: 0 %
Basophils Absolute: 0 10*3/uL (ref 0.0–0.1)
EOS ABS: 0.2 10*3/uL (ref 0.0–0.7)
Eosinophils Relative: 1 %
HCT: 37.7 % (ref 36.0–46.0)
HEMOGLOBIN: 11.9 g/dL — AB (ref 12.0–15.0)
LYMPHS ABS: 2.6 10*3/uL (ref 0.7–4.0)
Lymphocytes Relative: 19 %
MCH: 29.1 pg (ref 26.0–34.0)
MCHC: 31.6 g/dL (ref 30.0–36.0)
MCV: 92.2 fL (ref 78.0–100.0)
Monocytes Absolute: 1.5 10*3/uL — ABNORMAL HIGH (ref 0.1–1.0)
Monocytes Relative: 11 %
NEUTROS PCT: 69 %
Neutro Abs: 9.5 10*3/uL — ABNORMAL HIGH (ref 1.7–7.7)
Platelets: 255 10*3/uL (ref 150–400)
RBC: 4.09 MIL/uL (ref 3.87–5.11)
RDW: 14 % (ref 11.5–15.5)
WBC: 13.7 10*3/uL — AB (ref 4.0–10.5)

## 2016-06-30 LAB — BASIC METABOLIC PANEL
Anion gap: 12 (ref 5–15)
BUN: 37 mg/dL — AB (ref 6–20)
CALCIUM: 9.7 mg/dL (ref 8.9–10.3)
CO2: 29 mmol/L (ref 22–32)
CREATININE: 1.41 mg/dL — AB (ref 0.44–1.00)
Chloride: 97 mmol/L — ABNORMAL LOW (ref 101–111)
GFR calc Af Amer: 39 mL/min — ABNORMAL LOW (ref >60)
GFR, EST NON AFRICAN AMERICAN: 34 mL/min — AB (ref >60)
GLUCOSE: 156 mg/dL — AB (ref 65–99)
Potassium: 3.2 mmol/L — ABNORMAL LOW (ref 3.5–5.1)
Sodium: 138 mmol/L (ref 135–145)

## 2016-06-30 LAB — CBC
HEMATOCRIT: 42.4 % (ref 36.0–46.0)
Hemoglobin: 13.5 g/dL (ref 12.0–15.0)
MCH: 29.3 pg (ref 26.0–34.0)
MCHC: 31.8 g/dL (ref 30.0–36.0)
MCV: 92.2 fL (ref 78.0–100.0)
PLATELETS: 291 10*3/uL (ref 150–400)
RBC: 4.6 MIL/uL (ref 3.87–5.11)
RDW: 14 % (ref 11.5–15.5)
WBC: 15.3 10*3/uL — ABNORMAL HIGH (ref 4.0–10.5)

## 2016-06-30 LAB — CREATININE, SERUM
Creatinine, Ser: 1.1 mg/dL — ABNORMAL HIGH (ref 0.44–1.00)
GFR, EST AFRICAN AMERICAN: 53 mL/min — AB (ref >60)
GFR, EST NON AFRICAN AMERICAN: 45 mL/min — AB (ref >60)

## 2016-06-30 LAB — URINALYSIS, ROUTINE W REFLEX MICROSCOPIC
BILIRUBIN URINE: NEGATIVE
Glucose, UA: NEGATIVE mg/dL
Hgb urine dipstick: NEGATIVE
KETONES UR: NEGATIVE mg/dL
NITRITE: NEGATIVE
PH: 7 (ref 5.0–8.0)
Protein, ur: NEGATIVE mg/dL
Specific Gravity, Urine: 1.02 (ref 1.005–1.030)

## 2016-06-30 LAB — URINE MICROSCOPIC-ADD ON

## 2016-06-30 LAB — I-STAT TROPONIN, ED: Troponin i, poc: 0 ng/mL (ref 0.00–0.08)

## 2016-06-30 LAB — MAGNESIUM: Magnesium: 1.4 mg/dL — ABNORMAL LOW (ref 1.7–2.4)

## 2016-06-30 LAB — STREP PNEUMONIAE URINARY ANTIGEN: Strep Pneumo Urinary Antigen: NEGATIVE

## 2016-06-30 LAB — I-STAT CG4 LACTIC ACID, ED: Lactic Acid, Venous: 1 mmol/L (ref 0.5–1.9)

## 2016-06-30 MED ORDER — ATENOLOL 50 MG PO TABS
100.0000 mg | ORAL_TABLET | Freq: Every day | ORAL | Status: DC
  Administered 2016-07-01 – 2016-07-02 (×2): 100 mg via ORAL
  Filled 2016-06-30 (×2): qty 2

## 2016-06-30 MED ORDER — ASPIRIN 81 MG PO CHEW
81.0000 mg | CHEWABLE_TABLET | Freq: Every day | ORAL | Status: DC
  Administered 2016-07-01 – 2016-07-02 (×2): 81 mg via ORAL
  Filled 2016-06-30 (×2): qty 1

## 2016-06-30 MED ORDER — SODIUM CHLORIDE 0.9 % IV BOLUS (SEPSIS): 1000.0000 mL | Freq: Once | INTRAVENOUS | Status: DC

## 2016-06-30 MED ORDER — SIMVASTATIN 40 MG PO TABS
40.0000 mg | ORAL_TABLET | Freq: Every day | ORAL | Status: DC
  Administered 2016-07-01: 40 mg via ORAL
  Filled 2016-06-30: qty 1

## 2016-06-30 MED ORDER — AMITRIPTYLINE HCL 50 MG PO TABS
25.0000 mg | ORAL_TABLET | Freq: Every day | ORAL | Status: DC
  Administered 2016-06-30: 25 mg via ORAL
  Filled 2016-06-30 (×2): qty 1

## 2016-06-30 MED ORDER — HYDROCHLOROTHIAZIDE 25 MG PO TABS: 25.0000 mg | ORAL_TABLET | Freq: Every day | ORAL | Status: DC

## 2016-06-30 MED ORDER — ATENOLOL-CHLORTHALIDONE 100-25 MG PO TABS: 1.0000 | ORAL_TABLET | Freq: Every day | ORAL | Status: DC

## 2016-06-30 MED ORDER — GABAPENTIN 100 MG PO CAPS
100.0000 mg | ORAL_CAPSULE | Freq: Every day | ORAL | Status: DC
  Administered 2016-06-30 – 2016-07-01 (×2): 100 mg via ORAL
  Filled 2016-06-30 (×2): qty 1

## 2016-06-30 MED ORDER — FUROSEMIDE 40 MG PO TABS: 40.0000 mg | ORAL_TABLET | Freq: Every day | ORAL | Status: DC

## 2016-06-30 MED ORDER — ALPRAZOLAM 0.5 MG PO TABS
0.5000 mg | ORAL_TABLET | Freq: Two times a day (BID) | ORAL | Status: DC | PRN
  Administered 2016-06-30 – 2016-07-01 (×2): 0.5 mg via ORAL
  Filled 2016-06-30 (×2): qty 1

## 2016-06-30 MED ORDER — HYDROCODONE-ACETAMINOPHEN 7.5-325 MG PO TABS
1.0000 | ORAL_TABLET | Freq: Four times a day (QID) | ORAL | Status: DC | PRN
  Administered 2016-07-01 – 2016-07-02 (×3): 1 via ORAL
  Filled 2016-06-30 (×3): qty 1

## 2016-06-30 MED ORDER — SODIUM CHLORIDE 0.9 % IV BOLUS (SEPSIS)
1000.0000 mL | Freq: Once | INTRAVENOUS | Status: AC
  Administered 2016-06-30: 1000 mL via INTRAVENOUS

## 2016-06-30 MED ORDER — POTASSIUM CHLORIDE CRYS ER 20 MEQ PO TBCR
40.0000 meq | EXTENDED_RELEASE_TABLET | Freq: Once | ORAL | Status: AC
  Administered 2016-06-30: 40 meq via ORAL
  Filled 2016-06-30: qty 2

## 2016-06-30 MED ORDER — LEVOTHYROXINE SODIUM 50 MCG PO TABS
50.0000 ug | ORAL_TABLET | Freq: Every day | ORAL | Status: DC
  Administered 2016-07-01 – 2016-07-02 (×2): 50 ug via ORAL
  Filled 2016-06-30 (×2): qty 1

## 2016-06-30 MED ORDER — ENOXAPARIN SODIUM 30 MG/0.3ML ~~LOC~~ SOLN
30.0000 mg | SUBCUTANEOUS | Status: DC
  Administered 2016-06-30 – 2016-07-01 (×2): 30 mg via SUBCUTANEOUS
  Filled 2016-06-30 (×2): qty 0.3

## 2016-06-30 MED ORDER — DEXTROSE 5 % IV SOLN
1.0000 g | INTRAVENOUS | Status: DC
  Administered 2016-06-30 – 2016-07-01 (×2): 1 g via INTRAVENOUS
  Filled 2016-06-30 (×3): qty 10

## 2016-06-30 MED ORDER — PANTOPRAZOLE SODIUM 40 MG PO TBEC
40.0000 mg | DELAYED_RELEASE_TABLET | Freq: Every day | ORAL | Status: DC
  Administered 2016-06-30 – 2016-07-02 (×3): 40 mg via ORAL
  Filled 2016-06-30 (×3): qty 1

## 2016-06-30 MED ORDER — DICYCLOMINE HCL 10 MG PO CAPS
10.0000 mg | ORAL_CAPSULE | Freq: Three times a day (TID) | ORAL | Status: DC
Start: 2016-06-30 — End: 2016-07-02
  Administered 2016-07-01 – 2016-07-02 (×6): 10 mg via ORAL
  Filled 2016-06-30 (×6): qty 1

## 2016-06-30 MED ORDER — AZITHROMYCIN 500 MG IV SOLR
500.0000 mg | INTRAVENOUS | Status: DC
  Administered 2016-06-30 – 2016-07-01 (×2): 500 mg via INTRAVENOUS
  Filled 2016-06-30 (×2): qty 500

## 2016-06-30 NOTE — ED Notes (Signed)
Pt in xray

## 2016-06-30 NOTE — ED Notes (Addendum)
Gave pt a 1/4 cup of water (mainly ice chips), per Dr. Corlis LeakMacKuen.

## 2016-06-30 NOTE — H&P (Signed)
History and Physical   Stephanie Clay VHQ:[id number] DOB: 23-Mar-1932 DOA: 06/30/2016  PCP: Blane Ohara, MD  Chief Complaint: Weakness/fatigue  HPI:  Stephanie Clay is an 82 year old Caucasian female with past medical history significant for bronchiectasis, essential hypertension, neuropathy, anxiety, and GERD. Patient presents to the emergency department on 06/30/2016 complaining of weakness and fatigue. Patient notes she was recently diagnosed with pneumonia by her primary care physician. These records are not immediately available to me. Patient states she was placed on amoxicillin and is only taken 1 or 2 doses of this medication. She denies any chest pain, shortness of breath, abdominal pain, fever or chills at home. Patient is accompanied by her daughter, who also provides some history. Evidently, patient has a history of bronchiectasis and has been evaluated by pulmonary medicine in the past. She reports a history of multiple pulmonary infections, but recently has felt well. No recent hospitalizations. No known exposures to sick contacts. Daughter comets upon decreased oral intake and suspected dehydration at home.  ED Course:  Patient was hemodynamically stable in the emergency department. She was afebrile, normal heart rate, and normal blood pressure. She was saturating well on room air. Chest x-ray showed changes in the right lung compatible with chronic atypical infection. Patient was given 1 L bolus normal saline. Lab work was notable for hypokalemia with potassium 3.2, BMI 37, and creatinine 1.41. Unknown baseline creatinine, but family reports normal renal function. CBC shows mild leukocytosis with white blood cell count 15.3.  Review of Systems: A complete ROS was obtained; pertinent positives negatives are denoted in the HPI. Otherwise, all systems are negative.   Past Medical History:  Diagnosis Date  . Asthma   . Chronic chest pain   . Gastro-esophageal reflux   . Hyperlipemia   . Iron  (Fe) deficiency anemia   . Pneumathemia (HCC)   . Secondary hyperparathyroidism (HCC)   . Varicose veins    Bilateral leg   Social History   Social History  . Marital status: Married    Spouse name: carl  . Number of children: 3  . Years of education: 9   Occupational History  . retired    Social History Main Topics  . Smoking status: Former Smoker    Years: 1.00  . Smokeless tobacco: Former Neurosurgeon     Comment: socially smoked around age 39 x 1 year (did not smoke daily)   . Alcohol use No  . Drug use: No  . Sexual activity: Not on file   Other Topics Concern  . Not on file   Social History Narrative  . No narrative on file   Family History  Problem Relation Age of Onset  . Emphysema Father   . Heart disease Mother   . Cancer Sister   . Heart disease Sister   . Hypertension Sister   . Varicose Veins Sister   . Hyperlipidemia Daughter   . Hypertension Daughter     Physical Exam: Vitals:   06/30/16 1628 06/30/16 1730 06/30/16 1830 06/30/16 1900  BP: 164/69 146/62 125/59 142/80  Pulse: 62 (!) 59 (!) 55 (!) 58  Resp: 16 18 14 13   Temp:      TempSrc:      SpO2: 100% 98% 99% 97%   General: Appears calm and comfortable. Appears frail and elderly. ENT: Grossly normal hearing, MMM. Cardiovascular: RRR. No M/R/G. No LE edema.  Respiratory: Occasional crackles at right lung base. Abdomen: Soft, non-tender. Bowel sounds present.  Skin: No rash or  induration seen on limited exam. Musculoskeletal: Grossly normal tone BUE/BLE. Appropriate ROM.  Psychiatric: Grossly normal mood and affect. Neurologic: Moves all extremities in coordinated fashion.  I have personally reviewed the following labs, culture data, and imaging studies.  Labs:  Lab work shows elevated creatinine to 1.41 with elevated BUNs. Leukocytosis of 15.3. Troponin negative. Urinalysis only showing small leukocytes.  Culture:  Blood cx (9/9) -Pending  Sputum cx (9/9) -Pending  Radiology:  CXR  (9/9) Changes in the right lung compatible with findings on previous CT suggesting chronic atypical infection. No evidence for pulmonary edema or focal airspace consolidation.  Assessment/Plan: Stephanie Clay is an 82 year old Caucasian female with past medical history significant for bronchiectasis, essential hypertension, neuropathy, anxiety, and GERD. Patient presents to the emergency department on 06/30/2016 complaining of weakness and fatigue.  #1 Leukocytosis  It is unclear at this time why patient has a white blood cell count of 15.3. The history is nonspecific. Physical examination revealed only crackles at the right lung base. The recent chest x-ray is not overly convincing for pneumonia. More specifically, patient denies any pneumonia-like symptoms, such as shortness of breath, cough, or fevers and chills. Patient is otherwise not meeting sepsis criteria. It is possible given her elderly age and frail state that she is unable to mount a more systemic response to infection. The plan is to continue IV fluid for hydration. I am more concerned about an atypical infection given patient's history of bronchiectasis. Patient may have recurrent aspiration- may benefit from consultation with speech-language pathology. I will empirically place patient on azithromycin and ceftriaxone. I will also check blood culture, sputum culture, RVP, urine Legionella antigen, and urine strep pneumo antigen. If leukocytosis fails to improve, patient may benefit from consultation with pulmonary medicine for bronchoscopy. I will also check the differential from the CBC, as presence of eosinophils would change the differential diagnosis.  #2 AKI Patient's baseline renal function is not known at this time, but family reports it is normal. History suggests a prerenal etiology for AKI. The plan is to continue IV fluid with 1 L bolus normal saline. No significant electrode abnormalities except for hypokalemia, which I will replace  with oral potassium chloride. I expect renal function to improve after IV fluid.  #3 Essential hypertension Blood pressure is normal. We'll continue home medication of atenolol-chlorthalidone.  #4 Hypothyroidism We'll continue home levothyroxine  #5 GERD No active symptoms of acid reflux. We'll continue home pantoprazole.  DVT prophylaxis: Subq Lovenox Code Status: After some discussion with patient and daughter, patient has elected for DO NOT RESUSCITATE CODE STATUS. The significance of this was discussed in detail and patient and daughter agreed to proceed with DO NOT RESUSCITATE status. Disposition Plan: Anticipate D/C home in 2-5 days Consults called: None Admission status: Inpatient admission to MedSurg. Anticipate discharge home in approximately 3 days.  Tyrone SageMatthew Luian Schumpert, MD [facility] Page:[phone]  If 7PM-7AM, please contact night-coverage [email or web address] Password [email or web address]

## 2016-06-30 NOTE — ED Provider Notes (Signed)
[facility]-EMERGENCY DEPT Provider Note   CSN: [id number] Arrival date & time: 06/30/16  1422     History   Chief Complaint Chief Complaint  Patient presents with  . Fatigue    HPI Stephanie Clay is a 82 y.o. female.  HPI  Patient is a 82 year old female presenting with fatigue. Patient's been taking since last Saturday. She was diagnosed with a pneumonia night yesterday at [facility]. She was given an antibiotic. She said she took one dose last night. Family reports that she's been weak, unable to eat, not drinking fluids. Patient's f followed by pulmonology here and was diagnosed bronchiectasis 2 years ago. Patient has had increasing cough the last week. No urinary symptoms but history of multiple UTIs.  Past Medical History:  Diagnosis Date  . Asthma   . Chronic chest pain   . Gastro-esophageal reflux   . Hyperlipemia   . Iron (Fe) deficiency anemia   . Pneumathemia (HCC)   . Secondary hyperparathyroidism (HCC)   . Varicose veins    Bilateral leg    Patient Active Problem List   Diagnosis Date Noted  . Dysphagia 03/01/2015  . Bronchiectasis without acute exacerbation (HCC) 01/03/2015    Past Surgical History:  Procedure Laterality Date  . ABDOMINAL HYSTERECTOMY    . CYSTOCELE REPAIR    . SALPINGOOPHORECTOMY      OB History    No data available       Home Medications    Prior to Admission medications   Medication Sig Start Date End Date Taking? Authorizing Provider  ALPRAZolam Prudy Feeler) 0.5 MG tablet Take 0.5 mg by mouth 2 (two) times daily as needed for anxiety.    Historical Provider, MD  amitriptyline (ELAVIL) 25 MG tablet Take 25 mg by mouth at bedtime.    Historical Provider, MD  aspirin 81 MG tablet Take 81 mg by mouth daily.    Historical Provider, MD  atenolol-chlorthalidone (TENORETIC) 100-25 MG per tablet Take 1 tablet by mouth daily.    Historical Provider, MD  dicyclomine (BENTYL) 10 MG capsule Take 10 mg by mouth 4 (four) times daily -  before  meals and at bedtime.    Historical Provider, MD  fluticasone (FLONASE) 50 MCG/ACT nasal spray Place 2 sprays into both nostrils as needed.     Historical Provider, MD  furosemide (LASIX) 40 MG tablet Take 40 mg by mouth daily.    Historical Provider, MD  gabapentin (NEURONTIN) 100 MG capsule Take 100 mg by mouth at bedtime.    Historical Provider, MD  HYDROcodone-acetaminophen (NORCO) 7.5-325 MG per tablet Take 1 tablet by mouth every 6 (six) hours as needed for moderate pain. Pt. Takes 5mg     Historical Provider, MD  hydrOXYzine (ATARAX/VISTARIL) 25 MG tablet Take 25 mg by mouth 3 (three) times daily as needed.    Historical Provider, MD  levothyroxine (SYNTHROID, LEVOTHROID) 50 MCG tablet Take 50 mcg by mouth daily before breakfast.    Historical Provider, MD  pantoprazole (PROTONIX) 40 MG tablet Take 40 mg by mouth daily.    Historical Provider, MD  potassium chloride SA (K-DUR,KLOR-CON) 20 MEQ tablet Take 40 mEq by mouth 2 (two) times daily.     Historical Provider, MD  simvastatin (ZOCOR) 40 MG tablet Take 40 mg by mouth daily. Take 2 tablets by mouth daily    Historical Provider, MD  triamcinolone cream (KENALOG) 0.1 % Apply 1 application topically 2 (two) times daily.    Historical Provider, MD    [facility]  History Family History  Problem Relation Age of Onset  . Emphysema Father   . Heart disease Mother   . Cancer Sister   . Heart disease Sister   . Hypertension Sister   . Varicose Veins Sister   . Hyperlipidemia Daughter   . Hypertension Daughter     Social History Social History  Substance Use Topics  . Smoking status: Former Smoker    Years: 1.00  . Smokeless tobacco: Former Neurosurgeon     Comment: socially smoked around age 6 x 1 year (did not smoke daily)   . Alcohol use No     Allergies   Asa [aspirin]   Review of Systems Review of Systems  Constitutional: Positive for fatigue. Negative for activity change.  HENT: Positive for ear pain. Negative for congestion and  drooling.   Eyes: Negative for discharge.  Respiratory: Positive for cough. Negative for chest tightness.   Cardiovascular: Negative for chest pain.  Gastrointestinal: Negative for abdominal distention.  Genitourinary: Negative for difficulty urinating and dysuria.  Musculoskeletal: Negative for joint swelling.  Skin: Negative for rash.  Allergic/Immunologic: Negative for immunocompromised state.  Psychiatric/Behavioral: Negative for agitation and behavioral problems.  All other systems reviewed and are negative.    Physical Exam Updated Vital Signs BP 125/59   Pulse (!) 55   Temp 98.8 F (37.1 C) (Oral)   Resp 14   SpO2 99%   Physical Exam  Constitutional: She is oriented to person, place, and time. She appears well-developed and well-nourished.  Cachectic elderly female.  HENT:  Head: Normocephalic and atraumatic.  Right Ear: External ear normal.  Left Ear: External ear normal.  Dry mucus membranes.  TM normal on L and R  No appreciable lymphadenopathy  Eyes: Conjunctivae and EOM are normal. Pupils are equal, round, and reactive to light. Right eye exhibits no discharge. Left eye exhibits no discharge.  Neck: Normal range of motion. Neck supple.  Cardiovascular: Normal rate, regular rhythm and normal heart sounds.   No murmur heard. Pulmonary/Chest: Effort normal and breath sounds normal. She has no wheezes. She has no rales.  Abdominal: Soft. She exhibits no distension. There is no tenderness.  Neurological: She is oriented to person, place, and time.  Skin: Skin is warm and dry. She is not diaphoretic.  Psychiatric: She has a normal mood and affect.  Nursing note and vitals reviewed.    ED Treatments / Results  Labs (all labs ordered are listed, but only abnormal results are displayed) Labs Reviewed  BASIC METABOLIC PANEL - Abnormal; Notable for the following:       Result Value   Potassium 3.2 (*)    Chloride 97 (*)    Glucose, Bld 156 (*)    BUN 37 (*)      Creatinine, Ser 1.41 (*)    GFR calc non Af Amer 34 (*)    GFR calc Af Amer 39 (*)    All other components within normal limits  CBC - Abnormal; Notable for the following:    WBC 15.3 (*)    All other components within normal limits  URINALYSIS, ROUTINE W REFLEX MICROSCOPIC (NOT AT [facility]) - Abnormal; Notable for the following:    Leukocytes, UA SMALL (*)    All other components within normal limits  URINE MICROSCOPIC-ADD ON - Abnormal; Notable for the following:    Squamous Epithelial / LPF 6-30 (*)    Bacteria, UA RARE (*)    Casts HYALINE CASTS (*)    All  other components within normal limits  URINE CULTURE  CBG MONITORING, ED  I-STAT TROPOININ, ED  I-STAT CG4 LACTIC ACID, ED    EKG  EKG Interpretation  Date/Time:  Saturday June 30 2016 17:34:44 EDT Ventricular Rate:  59 PR Interval:    QRS Duration: 101 QT Interval:  401 QTC Calculation: 398 R Axis:   54 Text Interpretation:  Sinus rhythm Abnormal R-wave progression, early transition Minimal ST depression, diffuse leads Baseline wander in lead(s) V6 no sig signs of ischemia noted Confirmed by Kandis MannanMACKUEN, COURTNEY ([id number]) on 06/30/2016 5:48:47 PM       Radiology Dg Chest 2 View  Result Date: 06/30/2016 CLINICAL DATA:  Weakness today with cough for 2 weeks. EXAM: CHEST  2 VIEW COMPARISON:  CT scan 01/12/2016 FINDINGS: Lungs appear hyperexpanded. Micro nodularity seen in the right lung with associated bronchiectasis noted anteriorly on the lateral film is compatible with changes seen on previous CT scan. No focal airspace consolidation. No pulmonary edema or pleural effusion. Thoraco lumbar scoliosis noted. IMPRESSION: Changes in the right lung compatible with findings on previous CT suggesting chronic atypical infection. No evidence for pulmonary edema or focal airspace consolidation. Electronically Signed   By: Kennith CenterEric  Mansell M.D.   On: 06/30/2016 16:33    Procedures Procedures (including critical care  time)  Medications Ordered in ED Medications  sodium chloride 0.9 % bolus 1,000 mL (1,000 mLs Intravenous New Bag/Given 06/30/16 1728)     Initial Impression / Assessment and Plan / ED Course  I have reviewed the triage vital signs and the nursing notes.  Pertinent labs & imaging results that were available during my care of the patient were reviewed by me and considered in my medical decision making (see chart for details).  Clinical Course    Patient is an 82 year old female presenting with weakness fatigue recently diagnosed pneumonia by outpatient hospital. Patient taken 1 day of antibiotic. We will get chest x-ray to confirm. Patient's family member states that she is often diagnosed with pneumonia but that her pulmonologist says it is not pneumonia and should not be treated with antibiotics. However patient has been having increasing cough the last week and has been having symptoms of feeling unwell. We'll also check her urine for urinary tract infection at this time.  6:47 PM Chest x-ray seems consistent with prior known chronic infection. UA does not show any infection. Patient has been on antibiotics for a day it is possible that either of these things are being masked. I do not think patient has any other source for infection, abdomen soft, no nuchal rigidity. Patient did have mild ear pain in the left but no evidence of infection. Given her high white count, AK I and patient's symptoms of inability to eat or take much by mouth I would like to admit for observation.  Final Clinical Impressions(s) / ED Diagnoses   Final diagnoses:  None    New Prescriptions New Prescriptions   No medications on file     Courteney Randall AnLyn Mackuen, MD 06/30/16 [phone]

## 2016-06-30 NOTE — ED Triage Notes (Signed)
Pt brought in by family with c/o pt not eating like she should, last ate "just a little bit" three days ago. Pt states she has been feeling very weak. Pt denies being any pain. Pt reports she was recently diagnosed by her lung doctor with a lung disease "bronchiectasis" and they want her to be treated for this disease and  "not pneumonia." A&OX4.

## 2016-06-30 NOTE — ED Notes (Signed)
Attempted report 

## 2016-07-01 ENCOUNTER — Encounter (HOSPITAL_COMMUNITY): Payer: Self-pay | Admitting: *Deleted

## 2016-07-01 DIAGNOSIS — J471 Bronchiectasis with (acute) exacerbation: Secondary | ICD-10-CM

## 2016-07-01 DIAGNOSIS — E876 Hypokalemia: Secondary | ICD-10-CM | POA: Diagnosis present

## 2016-07-01 DIAGNOSIS — E039 Hypothyroidism, unspecified: Secondary | ICD-10-CM

## 2016-07-01 DIAGNOSIS — N2581 Secondary hyperparathyroidism of renal origin: Secondary | ICD-10-CM | POA: Diagnosis not present

## 2016-07-01 DIAGNOSIS — Z79899 Other long term (current) drug therapy: Secondary | ICD-10-CM | POA: Diagnosis not present

## 2016-07-01 DIAGNOSIS — R5383 Other fatigue: Secondary | ICD-10-CM | POA: Diagnosis not present

## 2016-07-01 DIAGNOSIS — R54 Age-related physical debility: Secondary | ICD-10-CM | POA: Diagnosis not present

## 2016-07-01 DIAGNOSIS — K219 Gastro-esophageal reflux disease without esophagitis: Secondary | ICD-10-CM | POA: Diagnosis not present

## 2016-07-01 DIAGNOSIS — N179 Acute kidney failure, unspecified: Secondary | ICD-10-CM | POA: Diagnosis not present

## 2016-07-01 DIAGNOSIS — R1310 Dysphagia, unspecified: Secondary | ICD-10-CM | POA: Diagnosis not present

## 2016-07-01 DIAGNOSIS — Z66 Do not resuscitate: Secondary | ICD-10-CM | POA: Diagnosis not present

## 2016-07-01 DIAGNOSIS — J479 Bronchiectasis, uncomplicated: Secondary | ICD-10-CM | POA: Diagnosis not present

## 2016-07-01 DIAGNOSIS — Z7982 Long term (current) use of aspirin: Secondary | ICD-10-CM | POA: Diagnosis not present

## 2016-07-01 DIAGNOSIS — E785 Hyperlipidemia, unspecified: Secondary | ICD-10-CM

## 2016-07-01 DIAGNOSIS — D509 Iron deficiency anemia, unspecified: Secondary | ICD-10-CM | POA: Diagnosis not present

## 2016-07-01 DIAGNOSIS — R739 Hyperglycemia, unspecified: Secondary | ICD-10-CM | POA: Diagnosis present

## 2016-07-01 DIAGNOSIS — I10 Essential (primary) hypertension: Secondary | ICD-10-CM | POA: Diagnosis not present

## 2016-07-01 HISTORY — DX: Essential (primary) hypertension: I10

## 2016-07-01 HISTORY — DX: Hypomagnesemia: E83.42

## 2016-07-01 HISTORY — DX: Hyperglycemia, unspecified: R73.9

## 2016-07-01 HISTORY — DX: Hypothyroidism, unspecified: E03.9

## 2016-07-01 HISTORY — DX: Hypokalemia: E87.6

## 2016-07-01 HISTORY — DX: Hyperlipidemia, unspecified: E78.5

## 2016-07-01 LAB — CBC
HEMATOCRIT: 38.4 % (ref 36.0–46.0)
HEMOGLOBIN: 11.9 g/dL — AB (ref 12.0–15.0)
MCH: 28.5 pg (ref 26.0–34.0)
MCHC: 31 g/dL (ref 30.0–36.0)
MCV: 92.1 fL (ref 78.0–100.0)
Platelets: 258 10*3/uL (ref 150–400)
RBC: 4.17 MIL/uL (ref 3.87–5.11)
RDW: 14.1 % (ref 11.5–15.5)
WBC: 12 10*3/uL — ABNORMAL HIGH (ref 4.0–10.5)

## 2016-07-01 LAB — URINE CULTURE: CULTURE: NO GROWTH

## 2016-07-01 LAB — RESPIRATORY PANEL BY PCR
Adenovirus: NOT DETECTED
BORDETELLA PERTUSSIS-RVPCR: NOT DETECTED
CHLAMYDOPHILA PNEUMONIAE-RVPPCR: NOT DETECTED
CORONAVIRUS NL63-RVPPCR: NOT DETECTED
Coronavirus 229E: NOT DETECTED
Coronavirus HKU1: NOT DETECTED
Coronavirus OC43: NOT DETECTED
INFLUENZA A-RVPPCR: NOT DETECTED
INFLUENZA B-RVPPCR: NOT DETECTED
MYCOPLASMA PNEUMONIAE-RVPPCR: NOT DETECTED
Metapneumovirus: NOT DETECTED
PARAINFLUENZA VIRUS 3-RVPPCR: NOT DETECTED
PARAINFLUENZA VIRUS 4-RVPPCR: NOT DETECTED
Parainfluenza Virus 1: NOT DETECTED
Parainfluenza Virus 2: NOT DETECTED
RESPIRATORY SYNCYTIAL VIRUS-RVPPCR: NOT DETECTED
RHINOVIRUS / ENTEROVIRUS - RVPPCR: DETECTED — AB

## 2016-07-01 LAB — COMPREHENSIVE METABOLIC PANEL
ALT: 9 U/L — AB (ref 14–54)
ANION GAP: 9 (ref 5–15)
AST: 17 U/L (ref 15–41)
Albumin: 3.3 g/dL — ABNORMAL LOW (ref 3.5–5.0)
Alkaline Phosphatase: 46 U/L (ref 38–126)
BUN: 23 mg/dL — ABNORMAL HIGH (ref 6–20)
CALCIUM: 9.2 mg/dL (ref 8.9–10.3)
CO2: 27 mmol/L (ref 22–32)
Chloride: 104 mmol/L (ref 101–111)
Creatinine, Ser: 0.93 mg/dL (ref 0.44–1.00)
GFR calc non Af Amer: 56 mL/min — ABNORMAL LOW (ref >60)
GLUCOSE: 110 mg/dL — AB (ref 65–99)
POTASSIUM: 3.3 mmol/L — AB (ref 3.5–5.1)
SODIUM: 140 mmol/L (ref 135–145)
TOTAL PROTEIN: 6.3 g/dL — AB (ref 6.5–8.1)
Total Bilirubin: 0.7 mg/dL (ref 0.3–1.2)

## 2016-07-01 LAB — MAGNESIUM: Magnesium: 1.6 mg/dL — ABNORMAL LOW (ref 1.7–2.4)

## 2016-07-01 MED ORDER — ENSURE ENLIVE PO LIQD
237.0000 mL | Freq: Two times a day (BID) | ORAL | Status: DC
  Administered 2016-07-01 – 2016-07-02 (×3): 237 mL via ORAL

## 2016-07-01 MED ORDER — MAGNESIUM SULFATE 2 GM/50ML IV SOLN
2.0000 g | Freq: Once | INTRAVENOUS | Status: AC
  Administered 2016-07-01: 2 g via INTRAVENOUS
  Filled 2016-07-01: qty 50

## 2016-07-01 MED ORDER — POTASSIUM CHLORIDE CRYS ER 20 MEQ PO TBCR
40.0000 meq | EXTENDED_RELEASE_TABLET | Freq: Once | ORAL | Status: AC
  Administered 2016-07-01: 40 meq via ORAL
  Filled 2016-07-01: qty 2

## 2016-07-01 NOTE — Progress Notes (Signed)
Education given on bronchiectasis from www.[email or web address].org

## 2016-07-01 NOTE — Progress Notes (Signed)
Progress Note    NEZIAH VOGELGESANG  JWJ:[id number] DOB: 03-Apr-1932  DOA: 06/30/2016 PCP: Blane Ohara, MD    Brief Narrative:   Chief complaint: Follow-up weakness/fatigue  Stephanie Clay is an 82 y.o. female with a PMH of hypothyroidism stable on Synthroid, hypertension and bronchiectasis with recent treatment for pneumonia with amoxicillin was admitted 06/30/16 with a chief complaint of weakness, fatigue, decreased appetite. Initial evaluation in the ED revealed leukocytosis, hypokalemia, and acute kidney injury.  Assessment/Plan:   Principal Problem:   Bronchiectasis with evidence of chronic atypical infection and associated weakness/fatigue CT scan of the chest performed on 01/12/16 was compared with her old films from 01/15/15 by her pulmonologist. Per Dr. Kavin Leech notes: "There has been an interval improvement in her micronodular disease. Her bronchiectasis is largely unchanged. Given that she is asymptomatic we will defer bronchoscopy and plan to follow her with a repeat film in 1 year or sooner if she develops symptoms." Chest x-ray negative for acute infiltrates, shows findings consistent with her prior CT. Patient was placed on empiric Rocephin and azithromycin. Will obtain fungal blood culture and AFB sputum cultures to rule out mycobacterial infection, although previous testing was negative for this. Case discussed with Dr. Orvan Falconer of ID who agrees with this plan. Close follow-up with pulmonologist. PT evaluation requested.  Active Problems:   Dysphagia Known history. Will get speech therapy evaluation.    AKI (acute kidney injury) (HCC) Creatinine improving with gentle hydration. Hold HCTZ and Lasix.    Hypomagnesemia Give 2 g IV magnesium today.    Hypokalemia Given 40 mEq of oral replacement on admission. Repeat today.    Hyperglycemia No history of diabetes. Check hemoglobin A1c.    Hypertension Continue atenolol. Hold HCTZ and Lasix.    Hypothyroidism Continue  Synthroid.    Hyperlipidemia Continue Zocor.    Family Communication/Anticipated D/C date and plan/Code Status   DVT prophylaxis: Lovenox ordered. Code Status: Full Code.  Family Communication: No family at the bedside. Disposition Plan: Home in next 24-48 hours if she remains stable/improved.   Medical Consultants:    None.   Procedures:    None  Anti-Infectives:    None  Subjective:   The patient reports that she feels a bit better. Review of symptoms is positive for occasional night sweats, occasional cough, weight loss and loose stools this morning and negative for dyspnea, fever. Appetite improved this morning.  Objective:    Vitals:   06/30/16 1930 06/30/16 1945 06/30/16 2028 07/01/16 0530  BP: 136/58 (!) 135/53 (!) 128/54 138/63  Pulse: (!) 59 61 (!) 59 65  Resp: 19 14 18 18   Temp:   100.1 F (37.8 C) 98.6 F (37 C)  TempSrc:   Oral Oral  SpO2: 98% 97% 96% 100%  Weight:   36.3 kg (80 lb 1.6 oz)   Height:   4\' 11"  (1.499 m)     Intake/Output Summary (Last 24 hours) at 07/01/16 1041 Last data filed at 07/01/16 1037  Gross per 24 hour  Intake             1540 ml  Output                3 ml  Net             1537 ml   Filed Weights   06/30/16 2028  Weight: 36.3 kg (80 lb 1.6 oz)    Exam: General exam: Appears calm and comfortable. Frail. Respiratory system: Clear  to auscultation. Respiratory effort normal. Cardiovascular system: S1 & S2 heard, RRR. No JVD,  rubs, gallops or clicks. No murmurs. Gastrointestinal system: Abdomen is nondistended, soft and nontender. No organomegaly or masses felt. Normal bowel sounds heard. Central nervous system: Alert and oriented. No focal neurological deficits. Extremities: No clubbing,  or cyanosis. No edema. Skin: No rashes, lesions or ulcers. Psychiatry: Judgement and insight appear normal. Mood & affect appropriate.   Data Reviewed:   I have personally reviewed following labs and imaging  studies:  Labs: Basic Metabolic Panel:  Recent Labs Lab 06/30/16 1520 06/30/16 2053 07/01/16 0844 07/01/16 0930  NA 138  --  140  --   K 3.2*  --  3.3*  --   CL 97*  --  104  --   CO2 29  --  27  --   GLUCOSE 156*  --  110*  --   BUN 37*  --  23*  --   CREATININE 1.41* 1.10* 0.93  --   CALCIUM 9.7  --  9.2  --   MG  --  1.4*  --  1.6*   GFR Estimated Creatinine Clearance: 26.7 mL/min (by C-G formula based on SCr of 0.93 mg/dL). Liver Function Tests:  Recent Labs Lab 07/01/16 0844  AST 17  ALT 9*  ALKPHOS 46  BILITOT 0.7  PROT 6.3*  ALBUMIN 3.3*   No results for input(s): LIPASE, AMYLASE in the last 168 hours. No results for input(s): AMMONIA in the last 168 hours. Coagulation profile No results for input(s): INR, PROTIME in the last 168 hours.  CBC:  Recent Labs Lab 06/30/16 1520 06/30/16 2015 07/01/16 0844  WBC 15.3* 13.7* 12.0*  NEUTROABS  --  9.5*  --   HGB 13.5 11.9* 11.9*  HCT 42.4 37.7 38.4  MCV 92.2 92.2 92.1  PLT 291 255 258   Cardiac Enzymes: No results for input(s): CKTOTAL, CKMB, CKMBINDEX, TROPONINI in the last 168 hours. BNP (last 3 results) No results for input(s): PROBNP in the last 8760 hours. CBG: No results for input(s): GLUCAP in the last 168 hours. D-Dimer: No results for input(s): DDIMER in the last 72 hours. Hgb A1c: No results for input(s): HGBA1C in the last 72 hours. Lipid Profile: No results for input(s): CHOL, HDL, LDLCALC, TRIG, CHOLHDL, LDLDIRECT in the last 72 hours. Thyroid function studies: No results for input(s): TSH, T4TOTAL, T3FREE, THYROIDAB in the last 72 hours.  Invalid input(s): FREET3 Anemia work up: No results for input(s): VITAMINB12, FOLATE, FERRITIN, TIBC, IRON, RETICCTPCT in the last 72 hours. Sepsis Labs:  Recent Labs Lab 06/30/16 1520 06/30/16 1703 06/30/16 2015 07/01/16 0844  WBC 15.3*  --  13.7* 12.0*  LATICACIDVEN  --  1.00  --   --     Microbiology No results found for this or any  previous visit (from the past 240 hour(s)).  Radiology: Dg Chest 2 View  Result Date: 06/30/2016 CLINICAL DATA:  Weakness today with cough for 2 weeks. EXAM: CHEST  2 VIEW COMPARISON:  CT scan 01/12/2016 FINDINGS: Lungs appear hyperexpanded. Micro nodularity seen in the right lung with associated bronchiectasis noted anteriorly on the lateral film is compatible with changes seen on previous CT scan. No focal airspace consolidation. No pulmonary edema or pleural effusion. Thoraco lumbar scoliosis noted. IMPRESSION: Changes in the right lung compatible with findings on previous CT suggesting chronic atypical infection. No evidence for pulmonary edema or focal airspace consolidation. Electronically Signed   By: Jamison Oka.D.  On: 06/30/2016 16:33    Medications:   . amitriptyline  25 mg Oral QHS  . aspirin  81 mg Oral Daily  . atenolol  100 mg Oral Daily  . azithromycin  500 mg Intravenous Q24H  . cefTRIAXone (ROCEPHIN)  IV  1 g Intravenous Q24H  . dicyclomine  10 mg Oral TID AC & HS  . enoxaparin (LOVENOX) injection  30 mg Subcutaneous Q24H  . feeding supplement (ENSURE ENLIVE)  237 mL Oral BID BM  . gabapentin  100 mg Oral QHS  . levothyroxine  50 mcg Oral QAC breakfast  . magnesium sulfate 1 - 4 g bolus IVPB  2 g Intravenous Once  . pantoprazole  40 mg Oral Daily  . potassium chloride  40 mEq Oral Once  . simvastatin  40 mg Oral q1800   Continuous Infusions:   Medical decision making is of high complexity and this patient is at high risk of deterioration, therefore this is a level 3 visit.     LOS: 1 day   Eulon Allnutt  [facility] Pager [phone]. If unable to reach me by pager, please call my cell phone at [phone].  *Please refer to [email or web address], password [email or web address] to get updated schedule on who will round on this patient, as hospitalists switch teams weekly. If 7PM-7AM, please contact night-coverage at [email or web address], password [email or web address] for any overnight needs.  07/01/2016,  10:41 AM

## 2016-07-02 DIAGNOSIS — E039 Hypothyroidism, unspecified: Secondary | ICD-10-CM | POA: Diagnosis not present

## 2016-07-02 DIAGNOSIS — R636 Underweight: Secondary | ICD-10-CM | POA: Diagnosis present

## 2016-07-02 DIAGNOSIS — Z79899 Other long term (current) drug therapy: Secondary | ICD-10-CM | POA: Diagnosis not present

## 2016-07-02 DIAGNOSIS — J471 Bronchiectasis with (acute) exacerbation: Secondary | ICD-10-CM | POA: Diagnosis not present

## 2016-07-02 DIAGNOSIS — R739 Hyperglycemia, unspecified: Secondary | ICD-10-CM | POA: Diagnosis not present

## 2016-07-02 DIAGNOSIS — J479 Bronchiectasis, uncomplicated: Secondary | ICD-10-CM | POA: Diagnosis not present

## 2016-07-02 DIAGNOSIS — D509 Iron deficiency anemia, unspecified: Secondary | ICD-10-CM | POA: Diagnosis not present

## 2016-07-02 DIAGNOSIS — R1310 Dysphagia, unspecified: Secondary | ICD-10-CM | POA: Diagnosis not present

## 2016-07-02 DIAGNOSIS — R5383 Other fatigue: Secondary | ICD-10-CM | POA: Diagnosis not present

## 2016-07-02 DIAGNOSIS — N2581 Secondary hyperparathyroidism of renal origin: Secondary | ICD-10-CM | POA: Diagnosis not present

## 2016-07-02 DIAGNOSIS — K219 Gastro-esophageal reflux disease without esophagitis: Secondary | ICD-10-CM | POA: Diagnosis not present

## 2016-07-02 DIAGNOSIS — I10 Essential (primary) hypertension: Secondary | ICD-10-CM | POA: Diagnosis not present

## 2016-07-02 DIAGNOSIS — N179 Acute kidney failure, unspecified: Secondary | ICD-10-CM | POA: Diagnosis not present

## 2016-07-02 DIAGNOSIS — Z66 Do not resuscitate: Secondary | ICD-10-CM | POA: Diagnosis not present

## 2016-07-02 DIAGNOSIS — R54 Age-related physical debility: Secondary | ICD-10-CM | POA: Diagnosis not present

## 2016-07-02 DIAGNOSIS — Z7982 Long term (current) use of aspirin: Secondary | ICD-10-CM | POA: Diagnosis not present

## 2016-07-02 HISTORY — DX: Underweight: R63.6

## 2016-07-02 LAB — BASIC METABOLIC PANEL
Anion gap: 7 (ref 5–15)
BUN: 31 mg/dL — AB (ref 6–20)
CO2: 31 mmol/L (ref 22–32)
CREATININE: 0.88 mg/dL (ref 0.44–1.00)
Calcium: 9 mg/dL (ref 8.9–10.3)
Chloride: 102 mmol/L (ref 101–111)
GFR, EST NON AFRICAN AMERICAN: 60 mL/min — AB (ref >60)
Glucose, Bld: 109 mg/dL — ABNORMAL HIGH (ref 65–99)
POTASSIUM: 4.1 mmol/L (ref 3.5–5.1)
SODIUM: 140 mmol/L (ref 135–145)

## 2016-07-02 LAB — HEMOGLOBIN A1C
Hgb A1c MFr Bld: 5.6 % (ref 4.8–5.6)
MEAN PLASMA GLUCOSE: 114 mg/dL

## 2016-07-02 LAB — LEGIONELLA PNEUMOPHILA SEROGP 1 UR AG: L. PNEUMOPHILA SEROGP 1 UR AG: NEGATIVE

## 2016-07-02 LAB — MAGNESIUM: MAGNESIUM: 2.3 mg/dL (ref 1.7–2.4)

## 2016-07-02 MED ORDER — CEFUROXIME AXETIL 500 MG PO TABS: 500.0000 mg | ORAL_TABLET | Freq: Two times a day (BID) | ORAL | 0 refills | Status: DC

## 2016-07-02 MED ORDER — AZITHROMYCIN 250 MG PO TABS: 250.0000 mg | ORAL_TABLET | Freq: Every day | ORAL | 0 refills | Status: AC

## 2016-07-02 MED ORDER — POTASSIUM CHLORIDE CRYS ER 20 MEQ PO TBCR: 20.0000 meq | EXTENDED_RELEASE_TABLET | Freq: Two times a day (BID) | ORAL | Status: DC

## 2016-07-02 MED ORDER — FLUCONAZOLE 100 MG PO TABS
150.0000 mg | ORAL_TABLET | Freq: Once | ORAL | Status: AC
  Administered 2016-07-02: 150 mg via ORAL
  Filled 2016-07-02: qty 1.5
  Filled 2016-07-02 (×2): qty 2

## 2016-07-02 NOTE — Evaluation (Signed)
Clinical/Bedside Swallow Evaluation Patient Details  Name: Stephanie LevelsMary C Clay MRN: [id number] Date of Birth: [birth year]  Today's Date: 07/02/2016 Time: SLP Start Time (ACUTE ONLY): 1031 SLP Stop Time (ACUTE ONLY): 1039 SLP Time Calculation (min) (ACUTE ONLY): 8 min  Past Medical History:  Past Medical History:  Diagnosis Date  . Asthma   . Bronchiectasis without acute exacerbation (HCC) 01/03/2015   CT chest 12/2014:  Bronchiectasis throughout right lung, left clear.  +tree and bud, + patchy gg infiltrates in RLL Sputum 2016: no growth, AFB smear neg PFT's 02/2015:  Normal except mild decrease in DLCO   . Chronic chest pain   . Gastro-esophageal reflux   . Hyperlipemia   . Iron (Fe) deficiency anemia   . Pneumathemia (HCC)   . Scoliosis   . Secondary hyperparathyroidism (HCC)   . Varicose veins    Bilateral leg   Past Surgical History:  Past Surgical History:  Procedure Laterality Date  . ABDOMINAL HYSTERECTOMY    . CYSTOCELE REPAIR    . SALPINGOOPHORECTOMY     HPI:  Stephanie Clay an 82 y.o.femalewith a PMH of hypothyroidism stable on Synthroid, hypertension and bronchiectasis with recent treatment for pneumonia with amoxicillin was admitted 06/30/16 with a chief complaint of weakness, fatigue, decreased appetite. Initial evaluation in the ED revealed leukocytosis, hypokalemia, and acute kidney injury. Pt has a history of esophageal dysphagia with esophagram on 2016 shows dysmotility.    Assessment / Plan / Recommendation Clinical Impression  Pt demonstrates normal swallow ability subjectively at bedside. Her history of dysphagia includes only a mild remote history of esophageal dysmotility which she reports bothers her rarely. She verbalized following basic esophageal precautions independently (following bites with sips, not eating before laying down etc) and she is obviously a careful, continentious eater. There are no subjective signs that would indicate aspiration. Recommend pt  continue current diet with no SLP f/u needed.     Aspiration Risk  Mild aspiration risk    Diet Recommendation Regular;Thin liquid   Liquid Administration via: Cup;Straw Medication Administration: Whole meds with liquid Supervision: Patient able to self feed Postural Changes: Seated upright at 90 degrees    Other  Recommendations     Follow up Recommendations  None    Frequency and Duration            Prognosis        Swallow Study   General HPI: Stephanie Clay an 82 y.o.femalewith a PMH of hypothyroidism stable on Synthroid, hypertension and bronchiectasis with recent treatment for pneumonia with amoxicillin was admitted 06/30/16 with a chief complaint of weakness, fatigue, decreased appetite. Initial evaluation in the ED revealed leukocytosis, hypokalemia, and acute kidney injury. Pt has a history of esophageal dysphagia with esophagram on 2016 shows dysmotility.  Type of Study: Bedside Swallow Evaluation Diet Prior to this Study: Regular;Thin liquids Temperature Spikes Noted: No Respiratory Status: Room air History of Recent Intubation: No Behavior/Cognition: Alert;Cooperative;Pleasant mood Oral Cavity Assessment: Within Functional Limits Oral Care Completed by SLP: No Oral Cavity - Dentition: Dentures, top;Dentures, bottom Vision: Functional for self-feeding Self-Feeding Abilities: Able to feed self Patient Positioning: Upright in bed Baseline Vocal Quality: Normal Volitional Cough: Strong Volitional Swallow: Able to elicit    Oral/Motor/Sensory Function     Ice Chips     Thin Liquid Thin Liquid: Within functional limits Presentation: Cup;Straw;Self Fed    Nectar Thick Nectar Thick Liquid: Not tested   Honey Thick Honey Thick Liquid: Not tested   Puree Puree: Not tested  Solid   GO   Solid: Within functional limits       [facility], [place] CCC-SLP [phone]  Claudine Mouton 07/02/2016,10:42 AM

## 2016-07-02 NOTE — Discharge Summary (Addendum)
Physician Discharge Summary  Stephanie Clay ZOX:[id number] DOB: 1932-05-14 DOA: 06/30/2016  PCP: Blane Ohara, MD  Admit date: 06/30/2016 Discharge date: 07/02/2016  Admitted From: Home Discharge disposition: Home   Recommendations for Outpatient Follow-Up:   1. Follow-up arranged with Dr. Delton Coombes of pulmonology, office will call with an appointment time. 2. Patient instructed to discontinue Lasix until she sees her PCP in follow-up for repeat blood pressure check. Accordingly, her dose of potassium supplementation was reduced. 3. PCP please follow-up on final blood culture results. Please refer for outpatient dietitian evaluation given weight loss.   Discharge Diagnosis:   Principal Problem:    Bronchiectasis with evidence of chronic atypical infection Active Problems:    Dysphagia    AKI (acute kidney injury) (HCC)    Hypomagnesemia    Hypokalemia    Hyperglycemia    Hypothyroidism    Essential hypertension    Hyperlipidemia    Underweight  Discharge Condition: Improved.  Diet recommendation: Low sodium, heart healthy.    History of Present Illness:   Stephanie Clay is an 82 y.o. female with a PMH of hypothyroidism stable on Synthroid, hypertension and bronchiectasis with recent treatment for pneumonia with amoxicillin was admitted 06/30/16 with a chief complaint of weakness, fatigue, decreased appetite. Initial evaluation in the ED revealed leukocytosis, hypokalemia, and acute kidney injury.  Hospital Course by Problem:   Principal Problem:   Bronchiectasis with evidence of chronic atypical infection and associated weakness/fatigue CT scan of the chest performed on 01/12/16 was compared with her old films from 01/15/15 by her pulmonologist. Per Dr. Kavin Leech notes: "There has been an interval improvement in her micronodular disease. Her bronchiectasis is largely unchanged. Given that she is asymptomatic we will defer bronchoscopy and plan to follow her with a repeat film  in 1 year or sooner if she develops symptoms." Chest x-ray negative for acute infiltrates, shows findings consistent with her prior CT. Patient was placed on empiric Rocephin and azithromycin. Respiratory virus panel was negative. Case discussed with Dr. Orvan Falconer of ID who agrees with this plan. Close follow-up with pulmonologist. Patient felt well enough and requested discharge on 07/02/16. Evaluated by PT prior to discharge with no PT needs needed post discharge. The patient will follow-up with pulmonology, office will call with an appointment time. D/C on Ceftin/Azithromycin.  Active Problems:   Dysphagia Speech therapy evaluation  performed, no specific intervention needed. Continue regular diet with thin liquids.    AKI (acute kidney injury) (HCC) Creatinine improved with gentle hydration. HCTZ and Lasix were held during hospital stay.    Hypomagnesemia Given 2 g IV magnesium 07/01/16. Magnesium now WNL.    Hypokalemia Potassium WNL after repletion.    Hyperglycemia No history of diabetes. Hemoglobin A1c 5.6%.    Hypertension Continue atenolol. Okay to resume HCTZ, but have instructed the patient to discontinue Lasix.    Hypothyroidism Continue Synthroid.    Hyperlipidemia Continue Zocor.    Underweight Patient has constitutional symptoms including weight loss and night sweats, worrisome for underlying mycobacterial infection. She will follow-up with pulmonology for further evaluation. Recommend outpatient dietitian evaluation.    Medical Consultants:    None.   Discharge Exam:   Vitals:   07/01/16 2102 07/02/16 0513  BP: (!) 107/51 (!) 105/49  Pulse: 66 60  Resp: 18 18  Temp: 98.5 F (36.9 C) 98.1 F (36.7 C)   Vitals:   07/01/16 1042 07/01/16 1401 07/01/16 2102 07/02/16 0513  BP: (!) 121/47 (!) 121/47 (!) 107/51 (!) 105/49  Pulse: 65 68 66 60  Resp:   18 18  Temp:  98.5 F (36.9 C) 98.5 F (36.9 C) 98.1 F (36.7 C)  TempSrc:  Oral Oral Oral    SpO2:  93% 96% 96%  Weight:      Height:       General exam: Appears calm and comfortable. Frail. Respiratory system: Clear to auscultation. Respiratory effort normal. Cardiovascular system: S1 & S2 heard, RRR. No JVD,  rubs, gallops or clicks. No murmurs. Gastrointestinal system: Abdomen is nondistended, soft and nontender. No organomegaly or masses felt. Normal bowel sounds heard. Central nervous system: Alert and oriented. No focal neurological deficits. Extremities: No clubbing,  or cyanosis. No edema. Skin: No rashes, lesions or ulcers. Psychiatry: Judgement and insight appear normal. Mood & affect appropriate.    The results of significant diagnostics from this hospitalization (including imaging, microbiology, ancillary and laboratory) are listed below for reference.     Procedures and Diagnostic Studies:   Dg Chest 2 View  Result Date: 06/30/2016 CLINICAL DATA:  Weakness today with cough for 2 weeks. EXAM: CHEST  2 VIEW COMPARISON:  CT scan 01/12/2016 FINDINGS: Lungs appear hyperexpanded. Micro nodularity seen in the right lung with associated bronchiectasis noted anteriorly on the lateral film is compatible with changes seen on previous CT scan. No focal airspace consolidation. No pulmonary edema or pleural effusion. Thoraco lumbar scoliosis noted. IMPRESSION: Changes in the right lung compatible with findings on previous CT suggesting chronic atypical infection. No evidence for pulmonary edema or focal airspace consolidation. Electronically Signed   By: Kennith Center M.D.   On: 06/30/2016 16:33     Labs:   Basic Metabolic Panel:  Recent Labs Lab 06/30/16 1520 06/30/16 2053 07/01/16 0844 07/01/16 0930 07/02/16 0443  NA 138  --  140  --  140  K 3.2*  --  3.3*  --  4.1  CL 97*  --  104  --  102  CO2 29  --  27  --  31  GLUCOSE 156*  --  110*  --  109*  BUN 37*  --  23*  --  31*  CREATININE 1.41* 1.10* 0.93  --  0.88  CALCIUM 9.7  --  9.2  --  9.0  MG  --  1.4*  --   1.6* 2.3   GFR Estimated Creatinine Clearance: 28.2 mL/min (by C-G formula based on SCr of 0.88 mg/dL). Liver Function Tests:  Recent Labs Lab 07/01/16 0844  AST 17  ALT 9*  ALKPHOS 46  BILITOT 0.7  PROT 6.3*  ALBUMIN 3.3*    CBC:  Recent Labs Lab 06/30/16 1520 06/30/16 2015 07/01/16 0844  WBC 15.3* 13.7* 12.0*  NEUTROABS  --  9.5*  --   HGB 13.5 11.9* 11.9*  HCT 42.4 37.7 38.4  MCV 92.2 92.2 92.1  PLT 291 255 258   Hgb A1c  Recent Labs  07/01/16 1000  HGBA1C 5.6   Microbiology Recent Results (from the past 240 hour(s))  Urine culture     Status: None   Collection Time: 06/30/16  5:28 PM  Result Value Ref Range Status   Specimen Description URINE, CLEAN CATCH  Final   Special Requests NONE  Final   Culture NO GROWTH  Final   Report Status 07/01/2016 FINAL  Final  Respiratory Panel by PCR     Status: Abnormal   Collection Time: 06/30/16  8:25 PM  Result Value Ref Range Status   Adenovirus NOT DETECTED NOT  DETECTED Final   Coronavirus 229E NOT DETECTED NOT DETECTED Final   Coronavirus HKU1 NOT DETECTED NOT DETECTED Final   Coronavirus NL63 NOT DETECTED NOT DETECTED Final   Coronavirus OC43 NOT DETECTED NOT DETECTED Final   Metapneumovirus NOT DETECTED NOT DETECTED Final   Rhinovirus / Enterovirus DETECTED (A) NOT DETECTED Final   Influenza A NOT DETECTED NOT DETECTED Final   Influenza B NOT DETECTED NOT DETECTED Final   Parainfluenza Virus 1 NOT DETECTED NOT DETECTED Final   Parainfluenza Virus 2 NOT DETECTED NOT DETECTED Final   Parainfluenza Virus 3 NOT DETECTED NOT DETECTED Final   Parainfluenza Virus 4 NOT DETECTED NOT DETECTED Final   Respiratory Syncytial Virus NOT DETECTED NOT DETECTED Final   Bordetella pertussis NOT DETECTED NOT DETECTED Final   Chlamydophila pneumoniae NOT DETECTED NOT DETECTED Final   Mycoplasma pneumoniae NOT DETECTED NOT DETECTED Final  Culture, blood (routine x 2)     Status: None (Preliminary result)   Collection Time:  06/30/16  8:40 PM  Result Value Ref Range Status   Specimen Description BLOOD LEFT ANTECUBITAL  Final   Special Requests BOTTLES DRAWN AEROBIC AND ANAEROBIC 5CC   Final   Culture NO GROWTH 2 DAYS  Final   Report Status PENDING  Incomplete  Culture, blood (routine x 2)     Status: None (Preliminary result)   Collection Time: 06/30/16  8:50 PM  Result Value Ref Range Status   Specimen Description BLOOD LEFT ARM  Final   Special Requests IN PEDIATRIC BOTTLE 1.5CC  Final   Culture NO GROWTH 2 DAYS  Final   Report Status PENDING  Incomplete  Fungus culture, blood     Status: None (Preliminary result)   Collection Time: 07/01/16 11:02 AM  Result Value Ref Range Status   Specimen Description BLOOD LEFT ANTECUBITAL  Final   Special Requests AEB 5CC  Final   Culture NO GROWTH 1 DAY  Final   Report Status PENDING  Incomplete     Discharge Instructions:   Discharge Instructions    Call MD for:  extreme fatigue    Complete by:  As directed   Call MD for:  persistant dizziness or light-headedness    Complete by:  As directed   Call MD for:  temperature >100.4    Complete by:  As directed   Diet - low sodium heart healthy    Complete by:  As directed   Discharge instructions    Complete by:  As directed   Do not take lasix until instructed to do so by your regular doctor.   Increase activity slowly    Complete by:  As directed       Medication List    STOP taking these medications   furosemide 40 MG tablet Commonly known as:  LASIX     TAKE these medications   ALPRAZolam 0.5 MG tablet Commonly known as:  XANAX Take 0.5 mg by mouth 2 (two) times daily as needed for anxiety.   aspirin 81 MG tablet Take 81 mg by mouth daily.   atenolol-chlorthalidone 100-25 MG tablet Commonly known as:  TENORETIC Take 1 tablet by mouth daily.   azithromycin 250 MG tablet Commonly known as:  ZITHROMAX Take 1 tablet (250 mg total) by mouth daily.   cefUROXime 500 MG tablet Commonly known as:   CEFTIN Take 1 tablet (500 mg total) by mouth 2 (two) times daily with a meal.   dicyclomine 10 MG capsule Commonly known as:  BENTYL  Take 10 mg by mouth 4 (four) times daily -  before meals and at bedtime.   fluticasone 50 MCG/ACT nasal spray Commonly known as:  FLONASE Place 2 sprays into both nostrils as needed.   HYDROcodone-acetaminophen 5-325 MG tablet Commonly known as:  NORCO/VICODIN Take 1 tablet by mouth every 6 (six) hours as needed for moderate pain.   hydrOXYzine 25 MG tablet Commonly known as:  ATARAX/VISTARIL Take 25 mg by mouth 3 (three) times daily as needed.   levothyroxine 50 MCG tablet Commonly known as:  SYNTHROID, LEVOTHROID Take 50 mcg by mouth daily before breakfast.   pantoprazole 40 MG tablet Commonly known as:  PROTONIX Take 40 mg by mouth daily.   potassium chloride SA 20 MEQ tablet Commonly known as:  K-DUR,KLOR-CON Take 1 tablet (20 mEq total) by mouth 2 (two) times daily. What changed:  how much to take   simvastatin 40 MG tablet Commonly known as:  ZOCOR Take 40 mg by mouth daily.   triamcinolone cream 0.1 % Commonly known as:  KENALOG Apply 1 application topically 2 (two) times daily as needed.      Follow-up Information    BYRUM,ROBERT S., MD Follow up in 2 week(s).   Specialty:  Pulmonary Disease Why:  Office will call with an appointment Contact information: [address] [place] [address] [phone]        Blane OharaOX,KIRSTEN, MD. Schedule an appointment as soon as possible for a visit on 07/09/2016.   Specialties:  Family Medicine, Interventional Cardiology, Radiology, Anesthesiology Why:  Appointment with Dr. Sedalia Mutaox is on 07/09/16 at 10am Please bring all records from hospital visit, up to date medications Contact information: [address] [place] [address] [phone]            Time coordinating discharge: 30 minutes.  Signed:  Tynleigh Birt  Pager [phone] [facility] 07/02/2016, 3:42  PM

## 2016-07-02 NOTE — Evaluation (Signed)
Physical Therapy Evaluation Patient Details Name: Stephanie Clay MRN: [id number] DOB: September 27, 1932 Today's Date: 07/02/2016   History of Present Illness  Stephanie Clay is an 82 year old Caucasian female with past medical history significant for bronchiectasis, essential hypertension, neuropathy, anxiety, and GERD. Patient presents to the emergency department on 06/30/2016 complaining of weakness and fatigue  Clinical Impression  Patient presents close to functional baseline.  Though do not feel she needs follow up PT at d/c, will follow her while in acute setting to allow further functional practice and general strengthening to address deficits listed below prior to d/c home.     Follow Up Recommendations No PT follow up    Equipment Recommendations  None recommended by PT    Recommendations for Other Services       Precautions / Restrictions Precautions Precautions: Fall Restrictions Weight Bearing Restrictions: No      Mobility  Bed Mobility Overal bed mobility: Modified Independent             General bed mobility comments: increased time  Transfers Overall transfer level: Needs assistance Equipment used: Rolling walker (2 wheeled) Transfers: Sit to/from Stand Sit to Stand: Supervision         General transfer comment: for safety  Ambulation/Gait Ambulation/Gait assistance: Supervision;Modified independent (Device/Increase time) Ambulation Distance (Feet): 160 Feet Assistive device: Rolling walker (2 wheeled) Gait Pattern/deviations: Step-through pattern;Decreased stride length;Trunk flexed;Narrow base of support     General Gait Details: appropriate use of walker and some cues for posture, but mainly manages unaided in hallway and in room with RW  Stairs            Wheelchair Mobility    Modified Rankin (Stroke Patients Only)       Balance Overall balance assessment: Needs assistance   Sitting balance-Leahy Scale: Good       Standing  balance-Leahy Scale: Fair Standing balance comment: able to manage in bathroom unaided with clothing and hygiene                             Pertinent Vitals/Pain Pain Assessment: No/denies pain    Home Living Family/patient expects to be discharged to:: Private residence Living Arrangements: Spouse/significant other Available Help at Discharge: Family Type of Home: House Home Access: Stairs to enter Entrance Stairs-Rails: Right Entrance Stairs-Number of Steps: 3 Home Layout: One level Home Equipment: Walker - 2 wheels;Shower seat;Grab bars - tub/shower      Prior Function Level of Independence: Independent with assistive device(s)         Comments: sometimes holds onto walls; was doing all the cleaning and some cooking, but not as much as she used to     Hand Dominance        Extremity/Trunk Assessment   Upper Extremity Assessment: Generalized weakness (some tremor noted with ambulation, but reports she does that when fatigued)           Lower Extremity Assessment: Generalized weakness         Communication   Communication: No difficulties  Cognition Arousal/Alertness: Awake/alert Behavior During Therapy: WFL for tasks assessed/performed Overall Cognitive Status: Within Functional Limits for tasks assessed                      General Comments General comments (skin integrity, edema, etc.): Patient reports has had HHPT in the past and was doing her HEP everyday at home until admission.  Feels no need for further  HHPT at this time.     Exercises        Assessment/Plan    PT Assessment Patient needs continued PT services  PT Diagnosis Abnormality of gait;Generalized weakness   PT Problem List Decreased strength;Decreased mobility;Decreased safety awareness;Decreased activity tolerance;Decreased balance;Decreased knowledge of use of DME  PT Treatment Interventions DME instruction;Gait training;Functional mobility training;Stair  training;Balance training;Therapeutic exercise;Therapeutic activities;Patient/family education   PT Goals (Current goals can be found in the Care Plan section) Acute Rehab PT Goals Patient Stated Goal: To go home today PT Goal Formulation: With patient Time For Goal Achievement: 07/07/16 Potential to Achieve Goals: Good    Frequency Min 3X/week   Barriers to discharge        Co-evaluation               End of Session Equipment Utilized During Treatment: Gait belt Activity Tolerance: Patient tolerated treatment well Patient left: in bed;with call bell/phone within reach;with bed alarm set           Time: 0912-0939 PT Time Calculation (min) (ACUTE ONLY): 27 min   Charges:   PT Evaluation $PT Eval Moderate Complexity: 1 Procedure PT Treatments $Gait Training: 8-22 mins   PT G CodesElray Mcgregor:        Herchel Hopkin 07/02/2016, 12:22 PM Sheran Lawlessyndi Icelynn Onken, PT [phone] 07/02/2016

## 2016-07-02 NOTE — Care Management Note (Signed)
Case Management Note  Patient Details  Name: Stephanie LevelsMary C Neddo MRN: [id number] Date of Birth: July 11, 1932  Subjective/Objective:                 Spoke with patient in the room. She stayes she has had HH in past and does exercises at home everyday. She states she does not want HH at this time. She lives at home with her husband and her daughters live close by. Patient/ spouse drives, has RW declines CM resources at this time, no needs identified.   Action/Plan:  DC to home self care.  Expected Discharge Date:  07/03/16               Expected Discharge Plan:  Home/Self Care  In-House Referral:  NA  Discharge planning Services  CM Consult  Post Acute Care Choice:  NA Choice offered to:  NA  DME Arranged:    DME Agency:     HH Arranged:    HH Agency:     Status of Service:  Completed, signed off  If discussed at [employer] Length of Stay Meetings, dates discussed:    Additional Comments:  Lawerance SabalDebbie Luverna Degenhart, RN 07/02/2016, 11:11 AM

## 2016-07-03 ENCOUNTER — Ambulatory Visit: Payer: Medicare Other | Admitting: Vascular Surgery

## 2016-07-05 LAB — CULTURE, BLOOD (ROUTINE X 2)
Culture: NO GROWTH
Culture: NO GROWTH

## 2016-07-05 NOTE — Progress Notes (Signed)
   07/02/16 1000  SLP G-Codes **NOT FOR INPATIENT CLASS**  Functional Assessment Tool Used (clinical judgement)  Functional Limitations Swallowing  Swallow Current Status (Z6109(G8996) [facility]  Swallow Go[facility] (U0454(G8997) [facility]  Swallow Discharge Status (U9811(G8998) [facility]  SLP Evaluations  $ SLP Speech Visit 1 Procedure  SLP Evaluations  $BSS Swallow 1 Procedure

## 2016-07-07 ENCOUNTER — Emergency Department (HOSPITAL_COMMUNITY): Payer: Medicare Other

## 2016-07-07 ENCOUNTER — Encounter (HOSPITAL_COMMUNITY): Payer: Self-pay

## 2016-07-07 ENCOUNTER — Emergency Department (HOSPITAL_COMMUNITY)
Admission: EM | Admit: 2016-07-07 | Discharge: 2016-07-07 | Disposition: A | Discharge status: Home / Self Care | Payer: Medicare Other | Attending: Emergency Medicine | Admitting: Emergency Medicine

## 2016-07-07 DIAGNOSIS — Z79899 Other long term (current) drug therapy: Secondary | ICD-10-CM | POA: Insufficient documentation

## 2016-07-07 DIAGNOSIS — J45909 Unspecified asthma, uncomplicated: Secondary | ICD-10-CM | POA: Diagnosis not present

## 2016-07-07 DIAGNOSIS — I10 Essential (primary) hypertension: Secondary | ICD-10-CM | POA: Diagnosis not present

## 2016-07-07 DIAGNOSIS — R531 Weakness: Secondary | ICD-10-CM | POA: Diagnosis not present

## 2016-07-07 DIAGNOSIS — R918 Other nonspecific abnormal finding of lung field: Secondary | ICD-10-CM | POA: Diagnosis not present

## 2016-07-07 DIAGNOSIS — Z87891 Personal history of nicotine dependence: Secondary | ICD-10-CM | POA: Insufficient documentation

## 2016-07-07 DIAGNOSIS — E039 Hypothyroidism, unspecified: Secondary | ICD-10-CM | POA: Insufficient documentation

## 2016-07-07 DIAGNOSIS — Z7982 Long term (current) use of aspirin: Secondary | ICD-10-CM | POA: Insufficient documentation

## 2016-07-07 LAB — COMPREHENSIVE METABOLIC PANEL
ALT: 14 U/L (ref 14–54)
AST: 16 U/L (ref 15–41)
Albumin: 3.2 g/dL — ABNORMAL LOW (ref 3.5–5.0)
Alkaline Phosphatase: 49 U/L (ref 38–126)
Anion gap: 10 (ref 5–15)
BUN: 14 mg/dL (ref 6–20)
CHLORIDE: 105 mmol/L (ref 101–111)
CO2: 26 mmol/L (ref 22–32)
CREATININE: 0.88 mg/dL (ref 0.44–1.00)
Calcium: 9.5 mg/dL (ref 8.9–10.3)
GFR calc non Af Amer: 60 mL/min — ABNORMAL LOW (ref >60)
Glucose, Bld: 95 mg/dL (ref 65–99)
Potassium: 4.3 mmol/L (ref 3.5–5.1)
SODIUM: 141 mmol/L (ref 135–145)
Total Bilirubin: 0.5 mg/dL (ref 0.3–1.2)
Total Protein: 6.1 g/dL — ABNORMAL LOW (ref 6.5–8.1)

## 2016-07-07 LAB — CBC WITH DIFFERENTIAL/PLATELET
Basophils Absolute: 0 10*3/uL (ref 0.0–0.1)
Basophils Relative: 0 %
EOS PCT: 1 %
Eosinophils Absolute: 0.1 10*3/uL (ref 0.0–0.7)
HEMATOCRIT: 35.1 % — AB (ref 36.0–46.0)
HEMOGLOBIN: 10.9 g/dL — AB (ref 12.0–15.0)
LYMPHS ABS: 1.7 10*3/uL (ref 0.7–4.0)
LYMPHS PCT: 13 %
MCH: 29.1 pg (ref 26.0–34.0)
MCHC: 31.1 g/dL (ref 30.0–36.0)
MCV: 93.6 fL (ref 78.0–100.0)
MONOS PCT: 10 %
Monocytes Absolute: 1.3 10*3/uL — ABNORMAL HIGH (ref 0.1–1.0)
NEUTROS PCT: 76 %
Neutro Abs: 10.1 10*3/uL — ABNORMAL HIGH (ref 1.7–7.7)
Platelets: 214 10*3/uL (ref 150–400)
RBC: 3.75 MIL/uL — AB (ref 3.87–5.11)
RDW: 14.4 % (ref 11.5–15.5)
WBC: 13.2 10*3/uL — AB (ref 4.0–10.5)

## 2016-07-07 LAB — URINALYSIS, ROUTINE W REFLEX MICROSCOPIC
Bilirubin Urine: NEGATIVE
GLUCOSE, UA: NEGATIVE mg/dL
Hgb urine dipstick: NEGATIVE
Ketones, ur: NEGATIVE mg/dL
LEUKOCYTES UA: NEGATIVE
Nitrite: NEGATIVE
PH: 7 (ref 5.0–8.0)
Protein, ur: NEGATIVE mg/dL
SPECIFIC GRAVITY, URINE: 1.008 (ref 1.005–1.030)

## 2016-07-07 LAB — I-STAT TROPONIN, ED: Troponin i, poc: 0 ng/mL (ref 0.00–0.08)

## 2016-07-07 LAB — BRAIN NATRIURETIC PEPTIDE: B Natriuretic Peptide: 183.3 pg/mL — ABNORMAL HIGH (ref 0.0–100.0)

## 2016-07-07 LAB — I-STAT CG4 LACTIC ACID, ED
LACTIC ACID, VENOUS: 0.96 mmol/L (ref 0.5–1.9)
Lactic Acid, Venous: 1.41 mmol/L (ref 0.5–1.9)

## 2016-07-07 NOTE — ED Provider Notes (Signed)
[facility]-EMER[facility]ENCY DEPT Provider Note   CSN: [id number] Arrival date & time: 07/07/16  1233     History   Chief Complaint Chief Complaint  Patient presents with  . Generalized Body Aches  . Fatigue    HPI Stephanie Clay is a 82 y.o. female.  HPI  82 year old female presenting with weakness and fatigue. Patient was discharged on 9/11 and since discharge has been progressively more and more tired and weak. Doesn't feel like getting out of bed. She is able to ambulate with a walker which is her baseline. Had a cough a couple nights ago but otherwise no cough. No shortness of breath, chest pain, or abdominal pain. No vomiting or diarrhea. She has noticed bilateral lower extremity edema worsening over the last several days. No dyspnea or orthopnea. No current dysuria. She took the antibiotic she was prescribed for last admission. At that time she was also noted to be in acute kidney injury and had her diuretics stopped. She has not restarted them.  Past Medical History:  Diagnosis Date  . Asthma   . Bronchiectasis without acute exacerbation (HCC) 01/03/2015   CT chest 12/2014:  Bronchiectasis throughout right lung, left clear.  +tree and bud, + patchy gg infiltrates in RLL Sputum 2016: no growth, AFB smear neg PFT's 02/2015:  Normal except mild decrease in DLCO   . Chronic chest pain   . Gastro-esophageal reflux   . Hyperlipemia   . Iron (Fe) deficiency anemia   . Pneumathemia (HCC)   . Scoliosis   . Secondary hyperparathyroidism (HCC)   . Varicose veins    Bilateral leg    Patient Active Problem List   Diagnosis Date Noted  . Underweight 07/02/2016  . Hypomagnesemia 07/01/2016  . Hypokalemia 07/01/2016  . Hyperglycemia 07/01/2016  . Hypothyroidism 07/01/2016  . Essential hypertension 07/01/2016  . Hyperlipidemia 07/01/2016  . AKI (acute kidney injury) (HCC) 06/30/2016  . Dysphagia 03/01/2015  . Bronchiectasis with evidence of chronic atypical infection 01/03/2015    Past  Surgical History:  Procedure Laterality Date  . ABDOMINAL HYSTERECTOMY    . CYSTOCELE REPAIR    . SALPINGOOPHORECTOMY      OB History    No data available       Home Medications    Prior to Admission medications   Medication Sig Start Date End Date Taking? Authorizing Provider  ALPRAZolam Prudy Feeler) 0.5 MG tablet Take 0.5 mg by mouth 2 (two) times daily as needed for anxiety.   Yes Historical Provider, MD  aspirin 81 MG tablet Take 81 mg by mouth daily.   Yes Historical Provider, MD  atenolol-chlorthalidone (TENORETIC) 100-25 MG per tablet Take 1 tablet by mouth daily.   Yes Historical Provider, MD  azithromycin (ZITHROMAX) 250 MG tablet Take 250 mg by mouth daily.   Yes Historical Provider, MD  dicyclomine (BENTYL) 10 MG capsule Take 10 mg by mouth 4 (four) times daily -  before meals and at bedtime.   Yes Historical Provider, MD  fluticasone (FLONASE) 50 MCG/ACT nasal spray Place 2 sprays into both nostrils as needed.    Yes Historical Provider, MD  HYDROcodone-acetaminophen (NORCO/VICODIN) 5-325 MG tablet Take 1 tablet by mouth every 6 (six) hours as needed for moderate pain.   Yes Historical Provider, MD  hydrOXYzine (ATARAX/VISTARIL) 25 MG tablet Take 25 mg by mouth 3 (three) times daily as needed.   Yes Historical Provider, MD  levothyroxine (SYNTHROID, LEVOTHROID) 50 MCG tablet Take 50 mcg by mouth daily before breakfast.  Yes Historical Provider, MD  pantoprazole (PROTONIX) 40 MG tablet Take 40 mg by mouth daily.   Yes Historical Provider, MD  potassium chloride SA (K-DUR,KLOR-CON) 20 MEQ tablet Take 1 tablet (20 mEq total) by mouth 2 (two) times daily. 07/02/16  Yes Christina P Rama, MD  simvastatin (ZOCOR) 40 MG tablet Take 40 mg by mouth daily.    Yes Historical Provider, MD  triamcinolone cream (KENALOG) 0.1 % Apply 1 application topically 2 (two) times daily as needed.    Yes Historical Provider, MD  cefUROXime (CEFTIN) 500 MG tablet Take 1 tablet (500 mg total) by mouth 2  (two) times daily with a meal. Patient not taking: Reported on 07/07/2016 07/02/16   Maryruth Bun Rama, MD    Family History Family History  Problem Relation Age of Onset  . Emphysema Father   . Heart disease Mother   . Cancer Sister   . Heart disease Sister   . Hypertension Sister   . Varicose Veins Sister   . Hyperlipidemia Daughter   . Hypertension Daughter     Social History Social History  Substance Use Topics  . Smoking status: Former Smoker    Years: 1.00  . Smokeless tobacco: Former Neurosurgeon     Comment: socially smoked around age 64 x 1 year (did not smoke daily)   . Alcohol use No     Allergies   Review of patient's allergies indicates no known allergies.   Review of Systems Review of Systems  Constitutional: Positive for fatigue. Negative for fever.  Respiratory: Negative for cough and shortness of breath.   Cardiovascular: Positive for leg swelling. Negative for chest pain.  Gastrointestinal: Negative for abdominal pain, diarrhea, nausea and vomiting.  Genitourinary: Negative for dysuria.  Musculoskeletal: Negative for back pain and neck pain.  Neurological: Positive for weakness. Negative for headaches.  All other systems reviewed and are negative.    Physical Exam Updated Vital Signs BP 129/55   Pulse (!) 59   Temp 99.4 F (37.4 C) (Rectal)   Resp 17   SpO2 95%   Physical Exam  Constitutional: She is oriented to person, place, and time. She appears well-developed and well-nourished.  HENT:  Head: Normocephalic and atraumatic.  Right Ear: External ear normal.  Left Ear: External ear normal.  Nose: Nose normal.  Eyes: Right eye exhibits no discharge. Left eye exhibits no discharge.  Cardiovascular: Normal rate, regular rhythm and normal heart sounds.   No murmur heard. Pulmonary/Chest: Effort normal and breath sounds normal. She has no wheezes. She has no rales.  Abdominal: Soft. There is no tenderness.  Musculoskeletal: She exhibits edema  (bilateral mildly pitting edema to lower 1/2 lower legs).  Neurological: She is alert and oriented to person, place, and time.  CN 3-12 grossly intact. 5/5 strength in all 4 extremities. Grossly normal sensation. Ambulates without difficulty with a walker   Skin: Skin is warm and dry.  Nursing note and vitals reviewed.    ED Treatments / Results  Labs (all labs ordered are listed, but only abnormal results are displayed) Labs Reviewed  COMPREHENSIVE METABOLIC PANEL - Abnormal; Notable for the following:       Result Value   Total Protein 6.1 (*)    Albumin 3.2 (*)    GFR calc non Af Amer 60 (*)    All other components within normal limits  CBC WITH DIFFERENTIAL/PLATELET - Abnormal; Notable for the following:    WBC 13.2 (*)    RBC 3.75 (*)  Hemoglobin 10.9 (*)    HCT 35.1 (*)    Neutro Abs 10.1 (*)    Monocytes Absolute 1.3 (*)    All other components within normal limits  URINE CULTURE  URINALYSIS, ROUTINE W REFLEX MICROSCOPIC (NOT AT [facility])  BRAIN NATRIURETIC PEPTIDE  I-STAT CG4 LACTIC ACID, ED  I-STAT TROPOININ, ED  I-STAT CG4 LACTIC ACID, ED    EKG  EKG Interpretation  Date/Time:  Saturday July 07 2016 14:55:27 EDT Ventricular Rate:  60 PR Interval:    QRS Duration: 100 QT Interval:  407 QTC Calculation: 407 R Axis:   39 Text Interpretation:  Sinus rhythm Abnormal R-wave progression, early transition Minimal ST depression, lateral leads no significant change since Sept 9 2017 Confirmed by Criss AlvineGOLDSTON MD, Yarexi Pawlicki [phone]([id number]) on 07/07/2016 3:31:31 PM       Radiology Dg Chest 2 View  Result Date: 07/07/2016 CLINICAL DATA:  Patient with history of possible infection. History of bronchiectasis. EXAM: CHEST  2 VIEW COMPARISON:  Chest radiograph 06/30/2016 FINDINGS: Stable cardiac and mediastinal contours. Re- demonstrated nodularity with associated bronchiectasis within the right lung. No large area of pulmonary consolidation. No pleural effusion pneumothorax.  Thoracolumbar scoliosis. IMPRESSION: Re- demonstrated findings suggestive of chronic atypical infection within the right lung. No large area pulmonary consolidation. Electronically Signed   By: Annia Beltrew  Davis M.D.   On: 07/07/2016 14:10    Procedures Procedures (including critical care time)  Medications Ordered in ED Medications - No data to display   Initial Impression / Assessment and Plan / ED Course  I have reviewed the triage vital signs and the nursing notes.  Pertinent labs & imaging results that were available during my care of the patient were reviewed by me and considered in my medical decision making (see chart for details).  Clinical Course  Comment By Time  Labs, ECG, CXR.  Pricilla LovelessScott Cloteal Isaacson, MD 09/16 1421    No acute findings. Lungs are clear, no reported dyspnea or current cough. CXR changes are likely chronic. Able to ambulate. No indication for admission for generalized weakness without other findings. Probably needs to go back on low dose lasix due to recurrent leg swelling. Urine pending, care transferred to Dr. Adriana Simasook with plan to d/c with antibiotics if positive, otherwise f/u with PCP.  Final Clinical Impressions(s) / ED Diagnoses   Final diagnoses:  None    New Prescriptions New Prescriptions   No medications on file     Pricilla LovelessScott Alyssah Algeo, MD 07/07/16 1636

## 2016-07-07 NOTE — Discharge Instructions (Addendum)
Tests show no life-threatening conditions.  Follow-up your primary care doctor.

## 2016-07-07 NOTE — ED Provider Notes (Signed)
Patient signed out to me by Dr. Criss AlvineGoldston.  Urinalysis pending at time of transition. Urinalysis normal. Test discussed with patient. Patient is alert and ambulatory at discharge.   Donnetta HutchingBrian Sterlin Knightly, MD 07/07/16 [phone]

## 2016-07-07 NOTE — ED Triage Notes (Signed)
Pt here reporting "I feel awful all over." She was just recently d/c from the hospital but reports they never found the source of the infection. Pt reports she does not feel any better at this time.

## 2016-07-08 LAB — URINE CULTURE: Culture: NO GROWTH

## 2016-07-08 LAB — FUNGUS CULTURE, BLOOD: Culture: NO GROWTH

## 2016-07-09 ENCOUNTER — Ambulatory Visit (INDEPENDENT_AMBULATORY_CARE_PROVIDER_SITE_OTHER): Payer: Medicare Other | Admitting: Adult Health

## 2016-07-09 ENCOUNTER — Encounter: Payer: Self-pay | Admitting: Adult Health

## 2016-07-09 DIAGNOSIS — J471 Bronchiectasis with (acute) exacerbation: Secondary | ICD-10-CM

## 2016-07-09 NOTE — Assessment & Plan Note (Signed)
Recent flare with Rhinovirus , treated with abx .  CXR is unchanged .  She has susupetcted underlying MAI but has essentially been asymptomatic with min. Cough , good appetite and no wt loss.  She has persistent low energy /fatigue. ? Etiology . Lab review is unrevealing .  For now encouarged rest and fluids , advance act as toleratefd.  follow up in 4 weeks , if persist may need to have repeat CT chest and consider FOB as she can not cough up sputum cx.   Plan  Patient Instructions  Continue on current regimen  Use Albuterol As needed   Follow up with Dr. Delton CoombesByrum  In 6 weeks and As needed   Follow up with Primary MD as planned.  Please contact office for sooner follow up if symptoms do not improve or worsen or seek emergency care

## 2016-07-09 NOTE — Patient Instructions (Addendum)
Continue on current regimen  Use Albuterol As needed   Follow up with Dr. Delton CoombesByrum  In 6 weeks and As needed   Follow up with Primary MD as planned.  Please contact office for sooner follow up if symptoms do not improve or worsen or seek emergency care

## 2016-07-09 NOTE — Progress Notes (Signed)
Subjective:    Patient ID: Stephanie Clay, female    DOB: December 14, 1931, 82 y.o.   MRN: [id number]  HPI 82 year old female former smoker followed for bronchiectasis and abnormal CT chest.  Test PFT May 2016 showed grossly normal. Airflow is without a bronchodilator response, normal lung volumes and a decrease DLCO corrects to normal range for alveolar volume. Modified barium swallow May 2016 showed termination of primary peristalsis in the mid thoracic esophagus, contrast stasis without any apparent mass or stricture. Sliding hiatal hernia and mild esophageal reflux. AFB sputum culture May 2016 negative CT chest March 2017 showed mild to moderate chronic infectious bronchiolitis with bronchiectasis and scarring with patchy and vomiting of both lungs characteristic of atypical mycobacterium infection such as MAI  07/09/2016 post hospital follow-up Patient returns for a post hospital follow-up. Patient was recently admitted with a bronchiectasis exacerbation. Viral panel was positive for rhinovirus. X-ray showed chronic changes. Blood cultures were negative. Since discharge she still has low energy . Discharged on Azithro and Ceftin .  Has minimal cough . No fever. Up until admission she was doing well with good appetite and no weight loss.  She denies any hemoptysis, chest pain, orthopnea, PND, or increased leg swelling Main complaint is she is tired with low energy. Went back to ER on 9/16 with fatigue. Labs were unrevealing Urine culture was neg.   Past Medical History:  Diagnosis Date  . Asthma   . Bronchiectasis without acute exacerbation (HCC) 01/03/2015   CT chest 12/2014:  Bronchiectasis throughout right lung, left clear.  +tree and bud, + patchy gg infiltrates in RLL Sputum 2016: no growth, AFB smear neg PFT's 02/2015:  Normal except mild decrease in DLCO   . Chronic chest pain   . Gastro-esophageal reflux   . Hyperlipemia   . Iron (Fe) deficiency anemia   . Pneumathemia (HCC)   .  Scoliosis   . Secondary hyperparathyroidism (HCC)   . Varicose veins    Bilateral leg     Current Outpatient Prescriptions on File Prior to Visit  Medication Sig Dispense Refill  . ALPRAZolam (XANAX) 0.5 MG tablet Take 0.5 mg by mouth 2 (two) times daily as needed for anxiety.    Marland Kitchen aspirin 81 MG tablet Take 81 mg by mouth daily.    Marland Kitchen azithromycin (ZITHROMAX) 250 MG tablet Take 250 mg by mouth daily.    . cefUROXime (CEFTIN) 500 MG tablet Take 1 tablet (500 mg total) by mouth 2 (two) times daily with a meal. 10 tablet 0  . dicyclomine (BENTYL) 10 MG capsule Take 10 mg by mouth 4 (four) times daily -  before meals and at bedtime.    . fluticasone (FLONASE) 50 MCG/ACT nasal spray Place 2 sprays into both nostrils as needed.     Marland Kitchen HYDROcodone-acetaminophen (NORCO/VICODIN) 5-325 MG tablet Take 1 tablet by mouth every 6 (six) hours as needed for moderate pain.    . hydrOXYzine (ATARAX/VISTARIL) 25 MG tablet Take 25 mg by mouth 3 (three) times daily as needed.    Marland Kitchen levothyroxine (SYNTHROID, LEVOTHROID) 50 MCG tablet Take 50 mcg by mouth daily before breakfast.    . pantoprazole (PROTONIX) 40 MG tablet Take 40 mg by mouth daily.    . potassium chloride SA (K-DUR,KLOR-CON) 20 MEQ tablet Take 1 tablet (20 mEq total) by mouth 2 (two) times daily.    . simvastatin (ZOCOR) 40 MG tablet Take 40 mg by mouth daily.     Marland Kitchen triamcinolone cream (KENALOG) 0.1 %  Apply 1 application topically 2 (two) times daily as needed.      No current facility-administered medications on file prior to visit.     Review of Systems Constitutional:   No  weight loss, night sweats,  Fevers, chills,  +fatigue, or  lassitude.  HEENT:   No headaches,  Difficulty swallowing,  Tooth/dental problems, or  Sore throat,                No sneezing, itching, ear ache, nasal congestion, post nasal drip,   CV:  No chest pain,  Orthopnea, PND, swelling in lower extremities, anasarca, dizziness, palpitations, syncope.   GI  No  heartburn, indigestion, abdominal pain, nausea, vomiting, diarrhea, change in bowel habits, loss of appetite, bloody stools.   Resp: No shortness of breath with exertion or at rest.  No excess mucus, no productive cough,  No non-productive cough,  No coughing up of blood.  No change in color of mucus.  No wheezing.  No chest wall deformity  Skin: no rash or lesions.  GU: no dysuria, change in color of urine, no urgency or frequency.  No flank pain, no hematuria   MS:  No joint pain or swelling.  No decreased range of motion.  No back pain.  Psych:  No change in mood or affect. No depression or anxiety.  No memory loss.         Objective:   Physical Exam Vitals:   07/09/16 1043  BP: (!) 150/80  Pulse: (!) 56  Temp: 98.3 F (36.8 C)  TempSrc: Oral  SpO2: 99%  Weight: 96 lb (43.5 kg)  Height: 4\' 11"  (1.499 m)   GEN: A/Ox3; pleasant , NAD, thin frail    HEENT:  [place]/AT,  EACs-clear, TMs-wnl, NOSE-clear, THROAT-clear, no lesions, no postnasal drip or exudate noted.   NECK:  Supple w/ fair ROM; no JVD; normal carotid impulses w/o bruits; no thyromegaly or nodules palpated; no lymphadenopathy.    RESP  Decreased BS in bases ; w/o, wheezes/ rales/ or rhonchi. no accessory muscle use, no dullness to percussion  CARD:  RRR, no m/r/g  , no peripheral edema, pulses intact, no cyanosis or clubbing.  GI:   Soft & nt; nml bowel sounds; no organomegaly or masses detected.   Musco: Warm bil, no deformities or joint swelling noted.   Neuro: alert, no focal deficits noted.    Skin: Warm, no lesions or rashes  Marwa Fuhrman NP-C  [facility]  07/09/2016        Assessment & Plan:

## 2016-07-10 NOTE — Progress Notes (Signed)
PT G-Code Note    07/02/16 1224  PT G-Codes **NOT FOR INPATIENT CLASS**  Functional Assessment Tool Used Clinical Judgement  Functional Limitation Mobility: Walking and moving around  Mobility: Walking and Moving Around Current Status [phone](G8978) CI  Mobility: Walking and Moving Around Goal Status [phone](G8979) CI  Mobility: Walking and Moving Around Discharge Status [phone](G8980) CI  [place] Arsenal[place]di Zuley [place], [place] [phone] 07/10/2016

## 2016-07-17 DIAGNOSIS — M545 Low back pain: Secondary | ICD-10-CM | POA: Diagnosis not present

## 2016-07-17 DIAGNOSIS — J471 Bronchiectasis with (acute) exacerbation: Secondary | ICD-10-CM | POA: Diagnosis not present

## 2016-07-17 DIAGNOSIS — I83813 Varicose veins of bilateral lower extremities with pain: Secondary | ICD-10-CM | POA: Diagnosis not present

## 2016-07-17 DIAGNOSIS — I10 Essential (primary) hypertension: Secondary | ICD-10-CM | POA: Diagnosis not present

## 2016-07-17 DIAGNOSIS — M5416 Radiculopathy, lumbar region: Secondary | ICD-10-CM | POA: Diagnosis not present

## 2016-07-19 DIAGNOSIS — N3000 Acute cystitis without hematuria: Secondary | ICD-10-CM | POA: Diagnosis not present

## 2016-08-02 ENCOUNTER — Telehealth: Payer: Self-pay | Admitting: *Deleted

## 2016-08-02 NOTE — Telephone Encounter (Signed)
[name] states she has "a place above my left ankle that is the size of my thumb and looks bruised and like a knot. I have an area around my right ankle that looks raw." She states that she has pain in bilateral ankles in the areas described above. She denies swelling in ankles or feet.  She states she is elevating her legs and applying Triamcinolone cream to the areas.  Advised [facility] scheduler to make an appointment for a bilateral venous reflux study and an appointment to see [facility] physician and call Mrs. Manor with appointments.

## 2016-08-07 ENCOUNTER — Telehealth: Payer: Self-pay | Admitting: Vascular Surgery

## 2016-08-07 NOTE — Telephone Encounter (Signed)
Per the nurse Sonya's instructions, I called the patient to offer her an appointment to see Dr.Early on 09/19/16 with a venous reflux study prior. She declined the appointment and stated she would call us back to schedule if she decided to proceed. awt

## 2016-08-20 ENCOUNTER — Encounter: Payer: Self-pay | Admitting: Emergency Medicine

## 2016-08-20 ENCOUNTER — Ambulatory Visit (INDEPENDENT_AMBULATORY_CARE_PROVIDER_SITE_OTHER): Payer: Medicare Other | Admitting: Emergency Medicine

## 2016-08-20 DIAGNOSIS — J471 Bronchiectasis with (acute) exacerbation: Secondary | ICD-10-CM | POA: Diagnosis not present

## 2016-08-20 DIAGNOSIS — J309 Allergic rhinitis, unspecified: Secondary | ICD-10-CM

## 2016-08-20 DIAGNOSIS — Z23 Encounter for immunization: Secondary | ICD-10-CM

## 2016-08-20 DIAGNOSIS — J301 Allergic rhinitis due to pollen: Secondary | ICD-10-CM

## 2016-08-20 HISTORY — DX: Allergic rhinitis, unspecified: J30.9

## 2016-08-20 NOTE — Progress Notes (Signed)
Subjective:    Patient ID: Stephanie Clay, female    DOB: 08-Dec-1931, 82 y.o.   MRN: [id number]  HPI 82 year old woman, former tobacco, has been followed by Dr. Shelle Ironlance for bronchiectasis and abnormal CT chest. There is been some question of dysphagia and possible aspiration.  She has been dealing with fatigue, had also had cough that has improved significantly. Her functional capacity is better.   She underwent pulmonary function testing in May 2016 that I personally reviewed. This showed grossly normal airflows without a bronchodilator response, normal lung volumes and a decreased DLCO that corrects to normal range for alveolar volume.. She had a modified barium swallow at [facility] on 03/03/15 that showed termination of primary peristalsis in the mid thoracic esophagus, contrast stasis without any apparent mass or stricture. She did have a small sliding hiatal hernia and mild esophageal reflux. Finally she had sputum cultures and AFB performed in May that showed normal flora and were negative for AFB. She underwent endoscopy that confirmed no obstructing lesions, Dr Chales AbrahamsGupta in [place].   Personal reviewed her CT scan from 01/15/15 at [facility].               ROV 01/19/16 --   Follow-up visit for history of bronchiectasis and a CT scan with a micronodular pattern that is most suggestive of mycobacterial disease. Her sputum samples have been negative for mycobacterial disease. At our last visit in September she was not having any active symptoms and we made the decision to defer bronchoscopy and to repeat her CT scan of the chest. This was done on 01/12/16 and I have reviewed the films. There has been interval improvement in her bronchiectasis and micronodular changes compared with 01/15/15 at [facility].   She does have some cough, rhinitis. She is on flonase,   ROV 08/20/16 -- Patient has a history of bronchiectasis and possible mycobacterial disease based on micronodular disease on CT scan of the  chest. Cultures have never grown out Mycobacterium.. She had a flare one month ago in setting URI and was seen in our office. She was treated with abx just prior to that visit. She is on flonase uses prn, has albuterol that she uses rarely. Coughs daily, scant sputum.    Review of Systems As per HPI  Past Medical History:  Diagnosis Date  . Asthma   . Bronchiectasis without acute exacerbation (HCC) 01/03/2015   CT chest 12/2014:  Bronchiectasis throughout right lung, left clear.  +tree and bud, + patchy gg infiltrates in RLL Sputum 2016: no growth, AFB smear neg PFT's 02/2015:  Normal except mild decrease in DLCO   . Chronic chest pain   . Gastro-esophageal reflux   . Hyperlipemia   . Iron (Fe) deficiency anemia   . Pneumathemia (HCC)   . Scoliosis   . Secondary hyperparathyroidism (HCC)   . Varicose veins    Bilateral leg     Family History  Problem Relation Age of Onset  . Emphysema Father   . Heart disease Mother   . Cancer Sister   . Heart disease Sister   . Hypertension Sister   . Varicose Veins Sister   . Hyperlipidemia Daughter   . Hypertension Daughter      Social History   Social History  . Marital status: Married    Spouse name: carl  . Number of children: 3  . Years of education: 9   Occupational History  . retired    Social History Main Topics  .  Smoking status: Never Smoker  . Smokeless tobacco: Never Used  . Alcohol use No  . Drug use: No  . Sexual activity: Not on file   Other Topics Concern  . Not on file   Social History Narrative  . No narrative on file     Allergies  Allergen Reactions  . Ciprofloxacin   . Levofloxacin   . Nitrofurantoin      Outpatient Medications Prior to Visit  Medication Sig Dispense Refill  . albuterol (PROVENTIL HFA;VENTOLIN HFA) 108 (90 Base) MCG/ACT inhaler Inhale 2 puffs into the lungs every 6 (six) hours as needed for wheezing or shortness of breath.    . ALPRAZolam (XANAX) 0.5 MG tablet Take 0.5 mg by  mouth 2 (two) times daily as needed for anxiety.    Marland Kitchen. aspirin 81 MG tablet Take 81 mg by mouth daily.    Marland Kitchen. azithromycin (ZITHROMAX) 250 MG tablet Take 250 mg by mouth daily.    Marland Kitchen. dicyclomine (BENTYL) 10 MG capsule Take 10 mg by mouth 4 (four) times daily -  before meals and at bedtime.    . fluticasone (FLONASE) 50 MCG/ACT nasal spray Place 2 sprays into both nostrils as needed.     Marland Kitchen. HYDROcodone-acetaminophen (NORCO/VICODIN) 5-325 MG tablet Take 1 tablet by mouth every 6 (six) hours as needed for moderate pain.    . hydrOXYzine (ATARAX/VISTARIL) 25 MG tablet Take 25 mg by mouth 3 (three) times daily as needed.    Marland Kitchen. levothyroxine (SYNTHROID, LEVOTHROID) 50 MCG tablet Take 50 mcg by mouth daily before breakfast.    . pantoprazole (PROTONIX) 40 MG tablet Take 40 mg by mouth daily.    . potassium chloride SA (K-DUR,KLOR-CON) 20 MEQ tablet Take 1 tablet (20 mEq total) by mouth 2 (two) times daily.    . simvastatin (ZOCOR) 40 MG tablet Take 40 mg by mouth daily.     Marland Kitchen. triamcinolone cream (KENALOG) 0.1 % Apply 1 application topically 2 (two) times daily as needed.     . cefUROXime (CEFTIN) 500 MG tablet Take 1 tablet (500 mg total) by mouth 2 (two) times daily with a meal. 10 tablet 0   No facility-administered medications prior to visit.          Objective:   Physical Exam Vitals:   08/20/16 0931  BP: (!) 140/92  Pulse: (!) 57  SpO2: 97%  Weight: 98 lb (44.5 kg)  Height: 4\' 11"  (1.499 m)   Gen: pleasant thin elderly woman, in no distress,  normal affect  ENT: No lesions,  mouth clear,  oropharynx clear, no postnasal drip  Neck: No JVD, no TMG, no carotid bruits  Lungs: No use of accessory muscles, clear without rales or rhonchi  Cardiovascular: RRR, heart sounds normal, no murmur or gallops, no peripheral edema  Musculoskeletal: No deformities, no cyanosis or clubbing  Neuro: alert, non focal  Skin: Warm, no lesions or rashes      Assessment & Plan:  Bronchiectasis with  evidence of chronic atypical infection  Take albuterol 2 puffs up to every 4 hours if needed for shortness of breath.  We will not retry Breo for now.  We will repeat your CT scan of your chest in 12/2016.  Follow with Dr Delton CoombesByrum in 6 months or sooner if you have any problems  Allergic rhinitis Please take your flonase every day during the allergy season.  You may want to consider starting loratadine 10mg  daily if your allergy symptoms are not well controlled on the  nasal spray.   Levy Pupa, MD, PhD 08/20/2016, 9:50 AM [facility] [phone] or if no answer [phone]

## 2016-08-20 NOTE — Assessment & Plan Note (Signed)
  Take albuterol 2 puffs up to every 4 hours if needed for shortness of breath.  We will not retry Breo for now.  We will repeat your CT scan of your chest in 12/2016.  Follow with Dr Delton CoombesByrum in 6 months or sooner if you have any problems

## 2016-08-20 NOTE — Assessment & Plan Note (Signed)
Please take your flonase every day during the allergy season.  You may want to consider starting loratadine 10mg  daily if your allergy symptoms are not well controlled on the nasal spray.

## 2016-08-20 NOTE — Patient Instructions (Addendum)
Please take your flonase every day during the allergy season.  You may want to consider starting loratadine 10mg  daily if your allergy symptoms are not well controlled on the nasal spray.  Take albuterol 2 puffs up to every 4 hours if needed for shortness of breath.  We will not retry Breo for now.  We will repeat your CT scan of your chest in 12/2016.  Follow with Dr Delton CoombesByrum in 6 months or sooner if you have any problems

## 2016-08-28 DIAGNOSIS — M79662 Pain in left lower leg: Secondary | ICD-10-CM | POA: Diagnosis not present

## 2016-08-28 DIAGNOSIS — I83813 Varicose veins of bilateral lower extremities with pain: Secondary | ICD-10-CM | POA: Diagnosis not present

## 2016-08-28 DIAGNOSIS — R233 Spontaneous ecchymoses: Secondary | ICD-10-CM | POA: Diagnosis not present

## 2016-08-28 DIAGNOSIS — M79661 Pain in right lower leg: Secondary | ICD-10-CM | POA: Diagnosis not present

## 2016-09-06 DIAGNOSIS — M79604 Pain in right leg: Secondary | ICD-10-CM | POA: Diagnosis not present

## 2016-09-06 DIAGNOSIS — M79661 Pain in right lower leg: Secondary | ICD-10-CM | POA: Diagnosis not present

## 2016-09-06 DIAGNOSIS — M79662 Pain in left lower leg: Secondary | ICD-10-CM | POA: Diagnosis not present

## 2016-09-06 DIAGNOSIS — M79605 Pain in left leg: Secondary | ICD-10-CM | POA: Diagnosis not present

## 2016-09-19 ENCOUNTER — Encounter: Payer: Medicare Other | Admitting: Vascular Surgery

## 2016-09-19 ENCOUNTER — Encounter (HOSPITAL_COMMUNITY): Payer: Medicare Other

## 2016-10-02 DIAGNOSIS — M79662 Pain in left lower leg: Secondary | ICD-10-CM | POA: Diagnosis not present

## 2016-10-02 DIAGNOSIS — R233 Spontaneous ecchymoses: Secondary | ICD-10-CM | POA: Diagnosis not present

## 2016-10-02 DIAGNOSIS — M79661 Pain in right lower leg: Secondary | ICD-10-CM | POA: Diagnosis not present

## 2016-10-02 DIAGNOSIS — I83813 Varicose veins of bilateral lower extremities with pain: Secondary | ICD-10-CM | POA: Diagnosis not present

## 2016-10-04 DIAGNOSIS — I8312 Varicose veins of left lower extremity with inflammation: Secondary | ICD-10-CM | POA: Diagnosis not present

## 2016-10-04 DIAGNOSIS — I83893 Varicose veins of bilateral lower extremities with other complications: Secondary | ICD-10-CM | POA: Diagnosis not present

## 2016-10-04 DIAGNOSIS — I8311 Varicose veins of right lower extremity with inflammation: Secondary | ICD-10-CM | POA: Diagnosis not present

## 2016-10-24 DIAGNOSIS — I8312 Varicose veins of left lower extremity with inflammation: Secondary | ICD-10-CM | POA: Diagnosis not present

## 2016-10-24 DIAGNOSIS — I8311 Varicose veins of right lower extremity with inflammation: Secondary | ICD-10-CM | POA: Diagnosis not present

## 2016-10-24 DIAGNOSIS — I83893 Varicose veins of bilateral lower extremities with other complications: Secondary | ICD-10-CM | POA: Diagnosis not present

## 2016-11-01 DIAGNOSIS — R0789 Other chest pain: Secondary | ICD-10-CM | POA: Diagnosis not present

## 2016-11-01 DIAGNOSIS — M25551 Pain in right hip: Secondary | ICD-10-CM | POA: Diagnosis not present

## 2016-11-01 DIAGNOSIS — R110 Nausea: Secondary | ICD-10-CM | POA: Diagnosis not present

## 2016-11-01 DIAGNOSIS — I80222 Phlebitis and thrombophlebitis of left popliteal vein: Secondary | ICD-10-CM | POA: Diagnosis not present

## 2016-11-01 DIAGNOSIS — R5383 Other fatigue: Secondary | ICD-10-CM | POA: Diagnosis not present

## 2016-11-01 DIAGNOSIS — J398 Other specified diseases of upper respiratory tract: Secondary | ICD-10-CM | POA: Diagnosis not present

## 2016-11-02 ENCOUNTER — Encounter: Payer: Self-pay | Admitting: Internal Medicine

## 2016-11-02 DIAGNOSIS — M25551 Pain in right hip: Secondary | ICD-10-CM | POA: Diagnosis not present

## 2016-11-02 NOTE — Progress Notes (Signed)
Patient ID: Stephanie Clay, female   DOB: October 19, 1932, 83 y.o.   MRN: [id number]     Cardiology Office Note   Date:  11/06/2016   ID:  Stephanie Clay, DOB 1932-08-21, MRN [id number]  PCP:  [facility]   Cardiologist:   Charlton Haws, MD   Chief Complaint  Patient presents with  . Chest Pain      History of Present Illness: Stephanie Clay is a 83 y.o. female seen in 2016 for evaluation of "CHF:.  Reviewed office notes from [facility] 2/13 and on and [facility] records   Patient appeared to have pneumonia and was Rx with antibiotics zitrhromycin, rocephin and duonebs.  Influenza negative She has HTN and elevated lipids.  She has some CRF and has had lisinopril held and diuretic dose decreased Also has had recent UTI  Has asthma with bronchiectasis and has been referred to pulmonary She has poor insight into her bronchiectasis Chronic LE edema improved with diuretic  Wears support hose No chest pain. No palpitations or syncope  Labs 2/16 K 3.1 Cr .9 BNP 2280 ( upper normal 1800 )   Hct 31.9   CXR:  COPD hyperinflation increased interstitial markings   Echo: Septum 9 mm  LA 3.2 cm  EF 63%  Normal diastolic filling pattern for age mild AR estimated PA pressure 50 mmHg  Last seen by [facility] Dr Stephanie Clay. MAI negative to date due for f/u CT March 2018  One episode of atypical sharp resting pain 2 weeks ago ECG ok   Past Medical History:  Diagnosis Date  . Asthma   . Bronchiectasis without acute exacerbation (HCC) 01/03/2015   CT chest 12/2014:  Bronchiectasis throughout right lung, left clear.  +tree and bud, + patchy gg infiltrates in RLL Sputum 2016: no growth, AFB smear neg PFT's 02/2015:  Normal except mild decrease in DLCO   . Chronic chest pain   . Gastro-esophageal reflux   . Hyperlipemia   . Iron (Fe) deficiency anemia   . Pneumathemia (HCC)   . Scoliosis   . Secondary hyperparathyroidism (HCC)   . Varicose veins    Bilateral leg    Past Surgical  History:  Procedure Laterality Date  . ABDOMINAL HYSTERECTOMY    . CYSTOCELE REPAIR    . SALPINGOOPHORECTOMY       Current Outpatient Prescriptions  Medication Sig Dispense Refill  . albuterol (PROVENTIL HFA;VENTOLIN HFA) 108 (90 Base) MCG/ACT inhaler Inhale 2 puffs into the lungs every 6 (six) hours as needed for wheezing or shortness of breath.    . ALPRAZolam (XANAX) 0.5 MG tablet Take 0.5 mg by mouth 2 (two) times daily as needed for anxiety.    Marland Kitchen apixaban (ELIQUIS) 2.5 MG TABS tablet Take 2.5 mg by mouth 2 (two) times daily.    Marland Kitchen azithromycin (ZITHROMAX) 250 MG tablet Take 250 mg by mouth daily.    Marland Kitchen dicyclomine (BENTYL) 10 MG capsule Take 10 mg by mouth 4 (four) times daily -  before meals and at bedtime.    . fluticasone (FLONASE) 50 MCG/ACT nasal spray Place 2 sprays into both nostrils as needed.     . furosemide (LASIX) 20 MG tablet Take 20 mg by mouth 2 (two) times daily.  2  . gabapentin (NEURONTIN) 100 MG capsule Take 100 mg by mouth 3 (three) times daily.  2  . HYDROcodone-acetaminophen (NORCO/VICODIN) 5-325 MG tablet Take 1 tablet by mouth every 6 (six) hours as needed for moderate  pain.    . hydrOXYzine (ATARAX/VISTARIL) 25 MG tablet Take 25 mg by mouth 3 (three) times daily as needed.    Marland Kitchen levothyroxine (SYNTHROID, LEVOTHROID) 50 MCG tablet Take 50 mcg by mouth daily before breakfast.    . meclizine (ANTIVERT) 12.5 MG tablet Take 12.5 mg by mouth every 8 (eight) hours as needed for dizziness.  1  . nitroGLYCERIN (NITROSTAT) 0.4 MG SL tablet Place 0.4 mg under the tongue every 5 (five) minutes x 3 doses as needed for chest pain. DO NOT EXCEED 3 DOSES  0  . pantoprazole (PROTONIX) 40 MG tablet Take 40 mg by mouth daily.    . potassium chloride SA (K-DUR,KLOR-CON) 20 MEQ tablet Take 1 tablet (20 mEq total) by mouth 2 (two) times daily.    . simvastatin (ZOCOR) 40 MG tablet Take 40 mg by mouth daily.     Marland Kitchen triamcinolone cream (KENALOG) 0.1 % Apply 1 application topically 2 (two)  times daily as needed.      No current facility-administered medications for this visit.     Allergies:   Ciprofloxacin; Levofloxacin; and Nitrofurantoin    Social History:  The patient  reports that she has never smoked. She has never used smokeless tobacco. She reports that she does not drink alcohol or use drugs.   Family History:  The patient's family history includes Cancer in her sister; Emphysema in her father; Heart disease in her mother and sister; Hyperlipidemia in her daughter; Hypertension in her daughter and sister; Varicose Veins in her sister.    ROS:  Please see the history of present illness.   Otherwise, review of systems are positive for none.   All other systems are reviewed and negative.    PHYSICAL EXAM: VS:  BP 140/60   Pulse 64   Ht 4\' 11"  (1.499 m)   Wt 97 lb 3.2 oz (44.1 kg)   SpO2 97%   BMI 19.63 kg/m  , BMI Body mass index is 19.63 kg/m. GEN: Well nourished, well developed, in no acute distress  HEENT: normal  Neck: no JVD, carotid bruits, or masses Cardiac:  RRR; no murmurs, rubs, or gallops,Trace LE edema  Respiratory:  Areas of rhonchi and wheezing  GI: soft, nontender, nondistended, + BS MS: no deformity or atrophy  Skin: warm and dry, no rash Neuro:  Strength and sensation are intact Psych: euthymic mood, full affect LE varicosities with bruising   EKG:   SR rate 60 Normal 12/07/14  07/07/16 NSR normal ECG no changes    Recent Labs: 07/02/2016: Magnesium 2.3 07/07/2016: ALT 14; B Natriuretic Peptide 183.3; BUN 14; Creatinine, Ser 0.88; Hemoglobin 10.9; Platelets 214; Potassium 4.3; Sodium 141    Lipid Panel No results found for: CHOL, TRIG, HDL, CHOLHDL, VLDL, LDLCALC, LDLDIRECT    Wt Readings from Last 3 Encounters:  11/06/16 97 lb 3.2 oz (44.1 kg)  08/20/16 98 lb (44.5 kg)  07/09/16 96 lb (43.5 kg)      Other studies Reviewed: Additional studies/ records that were reviewed today include:  30 minutes [facility] see HPI.    ASSESSMENT AND PLAN:  1.  Dyspne:  This is primarily from lung disease as is elevation in PA pressure estimate 50 mmHg on echo.  Had minimally elelvated BNP with increased interstitial markings On CXR during infectious illness of lungs may have leake a little fluid in an ARDS like pattern.  Echo shows normal EF no bad valves and normal diastolic pattern  for age  56 Edema:  Related to lung disease and venous insuf.  Continue lasix as needed with sliding scale 3. HTN continue tenoretic 4. Thyroid continue replacement TSH normal  5. Chol:  Continue statin  6. Atypical chest pain: observe doubt cardiac resolved    Current medicines are reviewed at length with the patient today.  The patient does not have concerns regarding medicines.  The following changes have been made:  no change  Labs/ tests ordered today include: Refer to Dr Shelle [facility]lance [facility] for Bronchiectasis   No orders of the defined types were placed in this encounter.    Disposition:   FU with  PRN     Signed, Charlton HawsPeter Valisa Karpel, MD  11/06/2016 9:04 AM    [facility] [address], [place], [place]  [address] Phone: [phone]; Fax: [phone]

## 2016-11-05 DIAGNOSIS — M79661 Pain in right lower leg: Secondary | ICD-10-CM | POA: Diagnosis not present

## 2016-11-05 DIAGNOSIS — R233 Spontaneous ecchymoses: Secondary | ICD-10-CM | POA: Diagnosis not present

## 2016-11-05 DIAGNOSIS — I83813 Varicose veins of bilateral lower extremities with pain: Secondary | ICD-10-CM | POA: Diagnosis not present

## 2016-11-05 DIAGNOSIS — M79662 Pain in left lower leg: Secondary | ICD-10-CM | POA: Diagnosis not present

## 2016-11-05 DIAGNOSIS — M25551 Pain in right hip: Secondary | ICD-10-CM | POA: Diagnosis not present

## 2016-11-06 ENCOUNTER — Encounter: Payer: Self-pay | Admitting: Cardiovascular Disease

## 2016-11-06 ENCOUNTER — Ambulatory Visit (INDEPENDENT_AMBULATORY_CARE_PROVIDER_SITE_OTHER): Payer: Medicare Other | Admitting: Cardiovascular Disease

## 2016-11-06 VITALS — BP 140/60 | HR 64 | Ht 59.0 in | Wt 97.2 lb

## 2016-11-06 DIAGNOSIS — R079 Chest pain, unspecified: Secondary | ICD-10-CM | POA: Diagnosis not present

## 2016-11-06 NOTE — Patient Instructions (Signed)

## 2016-11-16 ENCOUNTER — Ambulatory Visit (INDEPENDENT_AMBULATORY_CARE_PROVIDER_SITE_OTHER): Payer: Medicare Other | Admitting: Emergency Medicine

## 2016-11-16 ENCOUNTER — Encounter: Payer: Self-pay | Admitting: Emergency Medicine

## 2016-11-16 DIAGNOSIS — J471 Bronchiectasis with (acute) exacerbation: Secondary | ICD-10-CM | POA: Diagnosis not present

## 2016-11-16 NOTE — Assessment & Plan Note (Signed)
We will obtain the dates of your pneumonia shots from Dr [facility] Please continue albuterol 2 puffs if needed for shortness of breath, wheeze We will not repeat your CT scan of the chest now.  Follow with Dr Primrose Oler in 6 months or sooner if you have any problems Call our office to be seen if you develop signs of a flare up of your breathing / coughing 

## 2016-11-16 NOTE — Patient Instructions (Signed)
We will obtain the dates of your pneumonia shots from Dr Sedalia Mutaox Please continue albuterol 2 puffs if needed for shortness of breath, wheeze We will not repeat your CT scan of the chest now.  Follow with Dr Delton CoombesByrum in 6 months or sooner if you have any problems Call our office to be seen if you develop signs of a flare up of your breathing / coughing

## 2016-11-16 NOTE — Progress Notes (Signed)
Subjective:    Patient ID: Stephanie Clay, female    DOB: December 04, 1931, 83 y.o.   MRN: [id number]  HPI 83 year old woman, former tobacco, has been followed by Dr. Shelle Iron for bronchiectasis and abnormal CT chest. There is been some question of dysphagia and possible aspiration.  She has been dealing with fatigue, had also had cough that has improved significantly. Her functional capacity is better.   She underwent pulmonary function testing in May 2016 that I personally reviewed. This showed grossly normal airflows without a bronchodilator response, normal lung volumes and a decreased DLCO that corrects to normal range for alveolar volume.. She had a modified barium swallow at [facility] on 03/03/15 that showed termination of primary peristalsis in the mid thoracic esophagus, contrast stasis without any apparent mass or stricture. She did have a small sliding hiatal hernia and mild esophageal reflux. Finally she had sputum cultures and AFB performed in May that showed normal flora and were negative for AFB. She underwent endoscopy that confirmed no obstructing lesions, Dr Chales Abrahams in [place].   Personal reviewed her CT scan from 01/15/15 at [place].               ROV 01/19/16 --   Follow-up visit for history of bronchiectasis and a CT scan with a micronodular pattern that is most suggestive of mycobacterial disease. Her sputum samples have been negative for mycobacterial disease. At our last visit in September she was not having any active symptoms and we made the decision to defer bronchoscopy and to repeat her CT scan of the chest. This was done on 01/12/16 and I have reviewed the films. There has been interval improvement in her bronchiectasis and micronodular changes compared with 01/15/15 at [place].   She does have some cough, rhinitis. She is on flonase,   ROV 08/20/16 -- Patient has a history of bronchiectasis and possible mycobacterial disease based on micronodular disease on CT scan of the  chest. Cultures have never grown out Mycobacterium.. She had a flare one month ago in setting URI and was seen in our office. She was treated with abx just prior to that visit. She is on flonase uses prn, has albuterol that she uses rarely. Coughs daily, scant sputum.   ROV 11/16/16 -- visit for patient with a history of cough, bronchiectasis, probable mycobacterial disease based on appearance of her CT scan of the chest (always culture negative). She tells me that she had a URI beginning of December, was treated for an AE-bronchiectasis, finished abx last week. Even during the illness she was not coughing up any sputum. She was recently dx with a LLE DVT, now on Eliquis. She uses flonase prn. Has albuterol to use prn, uses rarely but did use it during her recent flare. She believes that she had pneumonia vaccine    Review of Systems As per HPI  Past Medical History:  Diagnosis Date  . Asthma   . Bronchiectasis without acute exacerbation (HCC) 01/03/2015   CT chest 12/2014:  Bronchiectasis throughout right lung, left clear.  +tree and bud, + patchy gg infiltrates in RLL Sputum 2016: no growth, AFB smear neg PFT's 02/2015:  Normal except mild decrease in DLCO   . Chronic chest pain   . Gastro-esophageal reflux   . Hyperlipemia   . Iron (Fe) deficiency anemia   . Pneumathemia (HCC)   . Scoliosis   . Secondary hyperparathyroidism (HCC)   . Varicose veins    Bilateral leg     Family  History  Problem Relation Age of Onset  . Emphysema Father   . Heart disease Mother   . Cancer Sister   . Heart disease Sister   . Hypertension Sister   . Varicose Veins Sister   . Hyperlipidemia Daughter   . Hypertension Daughter      Social History   Social History  . Marital status: Married    Spouse name: carl  . Number of children: 3  . Years of education: 9   Occupational History  . retired    Social History Main Topics  . Smoking status: Never Smoker  . Smokeless tobacco: Never Used  .  Alcohol use No  . Drug use: No  . Sexual activity: Not on file   Other Topics Concern  . Not on file   Social History Narrative  . No narrative on file     Allergies  Allergen Reactions  . Ciprofloxacin     unknown  . Levofloxacin     unknonw  . Nitrofurantoin     unknown     Outpatient Medications Prior to Visit  Medication Sig Dispense Refill  . albuterol (PROVENTIL HFA;VENTOLIN HFA) 108 (90 Base) MCG/ACT inhaler Inhale 2 puffs into the lungs every 6 (six) hours as needed for wheezing or shortness of breath.    . ALPRAZolam (XANAX) 0.5 MG tablet Take 0.5 mg by mouth 2 (two) times daily as needed for anxiety.    Marland Kitchen apixaban (ELIQUIS) 2.5 MG TABS tablet Take 2.5 mg by mouth 2 (two) times daily.    Marland Kitchen dicyclomine (BENTYL) 10 MG capsule Take 10 mg by mouth 4 (four) times daily -  before meals and at bedtime.    . fluticasone (FLONASE) 50 MCG/ACT nasal spray Place 2 sprays into both nostrils as needed.     . furosemide (LASIX) 20 MG tablet Take 20 mg by mouth 2 (two) times daily.  2  . gabapentin (NEURONTIN) 100 MG capsule Take 100 mg by mouth 3 (three) times daily.  2  . hydrOXYzine (ATARAX/VISTARIL) 25 MG tablet Take 25 mg by mouth 3 (three) times daily as needed.    Marland Kitchen levothyroxine (SYNTHROID, LEVOTHROID) 50 MCG tablet Take 50 mcg by mouth daily before breakfast.    . meclizine (ANTIVERT) 12.5 MG tablet Take 12.5 mg by mouth every 8 (eight) hours as needed for dizziness.  1  . nitroGLYCERIN (NITROSTAT) 0.4 MG SL tablet Place 0.4 mg under the tongue every 5 (five) minutes x 3 doses as needed for chest pain. DO NOT EXCEED 3 DOSES  0  . pantoprazole (PROTONIX) 40 MG tablet Take 40 mg by mouth daily.    . potassium chloride SA (K-DUR,KLOR-CON) 20 MEQ tablet Take 1 tablet (20 mEq total) by mouth 2 (two) times daily.    . simvastatin (ZOCOR) 40 MG tablet Take 40 mg by mouth daily.     Marland Kitchen triamcinolone cream (KENALOG) 0.1 % Apply 1 application topically 2 (two) times daily as needed.       Marland Kitchen azithromycin (ZITHROMAX) 250 MG tablet Take 250 mg by mouth daily.    Marland Kitchen HYDROcodone-acetaminophen (NORCO/VICODIN) 5-325 MG tablet Take 1 tablet by mouth every 6 (six) hours as needed for moderate pain.     No facility-administered medications prior to visit.          Objective:   Physical Exam Vitals:   11/16/16 0930  BP: (!) 150/52  Pulse: (!) 59  SpO2: 94%  Weight: 95 lb 3.2 oz (43.2 kg)  Height: 4\' 11"  (1.499 m)   Gen: pleasant thin elderly woman, in no distress,  normal affect  ENT: No lesions,  mouth clear,  oropharynx clear, no postnasal drip  Neck: No JVD, no TMG, no carotid bruits  Lungs: No use of accessory muscles, few B insp squeaks.   Cardiovascular: RRR, heart sounds normal, no murmur or gallops, no peripheral edema  Musculoskeletal: No deformities, no cyanosis or clubbing  Neuro: alert, non focal  Skin: Warm, no lesions or rashes      Assessment & Plan:  Bronchiectasis with evidence of chronic atypical infection We will obtain the dates of your pneumonia shots from Dr Sedalia Mutaox Please continue albuterol 2 puffs if needed for shortness of breath, wheeze We will not repeat your CT scan of the chest now.  Follow with Dr Delton CoombesByrum in 6 months or sooner if you have any problems Call our office to be seen if you develop signs of a flare up of your breathing / coughing  Levy Pupaobert Nichol Ator, MD, PhD 11/16/2016, 9:48 AM [facility] [phone] or if no answer [phone]

## 2016-11-22 DIAGNOSIS — I824Z2 Acute embolism and thrombosis of unspecified deep veins of left distal lower extremity: Secondary | ICD-10-CM | POA: Diagnosis not present

## 2016-11-26 DIAGNOSIS — M791 Myalgia: Secondary | ICD-10-CM | POA: Diagnosis not present

## 2016-11-26 DIAGNOSIS — N3000 Acute cystitis without hematuria: Secondary | ICD-10-CM | POA: Diagnosis not present

## 2016-12-07 DIAGNOSIS — R600 Localized edema: Secondary | ICD-10-CM | POA: Diagnosis not present

## 2016-12-07 DIAGNOSIS — I80222 Phlebitis and thrombophlebitis of left popliteal vein: Secondary | ICD-10-CM | POA: Diagnosis not present

## 2016-12-07 DIAGNOSIS — N3000 Acute cystitis without hematuria: Secondary | ICD-10-CM | POA: Diagnosis not present

## 2016-12-20 DIAGNOSIS — I700 Atherosclerosis of aorta: Secondary | ICD-10-CM | POA: Diagnosis not present

## 2016-12-20 DIAGNOSIS — J479 Bronchiectasis, uncomplicated: Secondary | ICD-10-CM | POA: Diagnosis not present

## 2016-12-20 DIAGNOSIS — I2510 Atherosclerotic heart disease of native coronary artery without angina pectoris: Secondary | ICD-10-CM | POA: Diagnosis not present

## 2016-12-27 DIAGNOSIS — R109 Unspecified abdominal pain: Secondary | ICD-10-CM | POA: Diagnosis not present

## 2016-12-27 DIAGNOSIS — R1010 Upper abdominal pain, unspecified: Secondary | ICD-10-CM | POA: Diagnosis not present

## 2016-12-27 DIAGNOSIS — R1084 Generalized abdominal pain: Secondary | ICD-10-CM | POA: Diagnosis not present

## 2016-12-31 DIAGNOSIS — R0602 Shortness of breath: Secondary | ICD-10-CM | POA: Diagnosis not present

## 2016-12-31 DIAGNOSIS — N3001 Acute cystitis with hematuria: Secondary | ICD-10-CM | POA: Diagnosis not present

## 2016-12-31 DIAGNOSIS — E860 Dehydration: Secondary | ICD-10-CM | POA: Diagnosis not present

## 2016-12-31 DIAGNOSIS — R10816 Epigastric abdominal tenderness: Secondary | ICD-10-CM | POA: Diagnosis not present

## 2016-12-31 DIAGNOSIS — N178 Other acute kidney failure: Secondary | ICD-10-CM | POA: Diagnosis not present

## 2017-01-07 DIAGNOSIS — K5909 Other constipation: Secondary | ICD-10-CM | POA: Diagnosis not present

## 2017-01-07 DIAGNOSIS — R1031 Right lower quadrant pain: Secondary | ICD-10-CM | POA: Diagnosis not present

## 2017-01-07 DIAGNOSIS — N3000 Acute cystitis without hematuria: Secondary | ICD-10-CM | POA: Diagnosis not present

## 2017-01-16 ENCOUNTER — Encounter: Payer: Self-pay | Admitting: Emergency Medicine

## 2017-01-16 ENCOUNTER — Ambulatory Visit (INDEPENDENT_AMBULATORY_CARE_PROVIDER_SITE_OTHER): Payer: Medicare Other | Admitting: Emergency Medicine

## 2017-01-16 DIAGNOSIS — J301 Allergic rhinitis due to pollen: Secondary | ICD-10-CM

## 2017-01-16 DIAGNOSIS — J471 Bronchiectasis with (acute) exacerbation: Secondary | ICD-10-CM | POA: Diagnosis not present

## 2017-01-16 NOTE — Progress Notes (Signed)
Subjective:    Patient ID: Stephanie LevelsMary C Clay, female    DOB: [birth year], 83 y.o.   MRN: [id number]  HPI 83 year old woman, former tobacco, has been followed by Dr. Shelle Ironlance for bronchiectasis and abnormal CT chest. There is been some question of dysphagia and possible aspiration.  She has been dealing with fatigue, had also had cough that has improved significantly. Her functional capacity is better.   She underwent pulmonary function testing in May 2016 that I personally reviewed. This showed grossly normal airflows without a bronchodilator response, normal lung volumes and a decreased DLCO that corrects to normal range for alveolar volume.. She had a modified barium swallow at [facility] on 03/03/15 that showed termination of primary peristalsis in the mid thoracic esophagus, contrast stasis without any apparent mass or stricture. She did have a small sliding hiatal hernia and mild esophageal reflux. Finally she had sputum cultures and AFB performed in May that showed normal flora and were negative for AFB. She underwent endoscopy that confirmed no obstructing lesions, Dr Chales AbrahamsGupta in [place].   Personal reviewed her CT scan from 01/15/15 at [facility].               ROV 01/19/16 --   Follow-up visit for history of bronchiectasis and a CT scan with a micronodular pattern that is most suggestive of mycobacterial disease. Her sputum samples have been negative for mycobacterial disease. At our last visit in September she was not having any active symptoms and we made the decision to defer bronchoscopy and to repeat her CT scan of the chest. This was done on 01/12/16 and I have reviewed the films. There has been interval improvement in her bronchiectasis and micronodular changes compared with 01/15/15 at [facility].   She does have some cough, rhinitis. She is on flonase,   ROV 08/20/16 -- Patient has a history of bronchiectasis and possible mycobacterial disease based on micronodular disease on CT scan of the  chest. Cultures have never grown out Mycobacterium.. She had a flare one month ago in setting URI and was seen in our office. She was treated with abx just prior to that visit. She is on flonase uses prn, has albuterol that she uses rarely. Coughs daily, scant sputum.   ROV 11/16/16 -- visit for patient with a history of cough, bronchiectasis, probable mycobacterial disease based on appearance of her CT scan of the chest (always culture negative). She tells me that she had a URI beginning of December, was treated for an AE-bronchiectasis, finished abx last week. Even during the illness she was not coughing up any sputum. She was recently dx with a LLE DVT, now on Eliquis. She uses flonase prn. Has albuterol to use prn, uses rarely but did use it during her recent flare. She believes that she had pneumonia vaccine   ROV 01/16/17 -- Stephanie Clay is a never smoker has a history of bronchiectasis, suspected mycobacterial disease on CT scan of the chest that has always been culture negative. Also with a history of allergic rhinitis and a lower extremity DVT on anticoagulation. Her last CT scan of the chest was on 01/12/16. Reports today reporting that she is having some occasional weakness, fatigue especially w exertion. Can be episodic. Not associated w dyspnea or cough. No fevers at those times. Her breathing is doing well. She has minimal cough. She had albuterol available fo rprn. She has sneezing, runny nose. She has flonase for prn.   Review of Systems As per HPI  Objective:   Physical Exam Vitals:   01/16/17 1350  BP: 118/60  Pulse: 65  SpO2: 98%  Weight: 90 lb 6.4 oz (41 kg)  Height: 4\' 11"  (1.499 m)   Gen: pleasant thin elderly woman, in no distress,  normal affect  ENT: No lesions,  mouth clear,  oropharynx clear, no postnasal drip  Neck: No JVD, no TMG, no carotid bruits  Lungs: No use of accessory muscles, few B insp squeaks.   Cardiovascular: RRR, heart sounds normal, no murmur or  gallops, no peripheral edema  Musculoskeletal: No deformities, no cyanosis or clubbing  Neuro: alert, non focal  Skin: Warm, no lesions or rashes      Assessment & Plan:  Bronchiectasis with evidence of chronic atypical infection Minimal cough. No complaint of dyspnea. Her biggest complaint is some weakness and fatigue that is being worked up currently. I do not believe we need to have a repeat CT scan of the chest at this time. She will likely require in March 2019. I will see her then or sooner if any problems.   Allergic rhinitis Change flonase to daily during the allergy seasons, then back to prn during the off season.   Levy Pupa, MD, PhD 01/16/2017, 2:08 PM [facility] [phone] or if no answer [phone]

## 2017-01-16 NOTE — Patient Instructions (Addendum)
Please continue to keep albuterol available to use as needed for cough or shortness of breath  We will hold off on a repeat CT scan of the chest for now. Dr Delton CoombesByrum will review your CT scan of the abdomen from [facility] (will let us see part of your lungs).  We will plan to repeat your CT chest in 2019 unless you have new symptoms.  Start taking flonase every day during the spring months.  Follow with Dr Delton CoombesByrum in 12 . months or sooner if you have any problems

## 2017-01-16 NOTE — Assessment & Plan Note (Signed)
Minimal cough. No complaint of dyspnea. Her biggest complaint is some weakness and fatigue that is being worked up currently. I do not believe we need to have a repeat CT scan of the chest at this time. She will likely require in March 2019. I will see her then or sooner if any problems.

## 2017-01-16 NOTE — Assessment & Plan Note (Signed)
Change flonase to daily during the allergy seasons, then back to prn during the off season.

## 2017-01-28 DIAGNOSIS — I824Z2 Acute embolism and thrombosis of unspecified deep veins of left distal lower extremity: Secondary | ICD-10-CM | POA: Diagnosis not present

## 2017-02-04 DIAGNOSIS — R600 Localized edema: Secondary | ICD-10-CM | POA: Diagnosis not present

## 2017-02-04 DIAGNOSIS — I87393 Chronic venous hypertension (idiopathic) with other complications of bilateral lower extremity: Secondary | ICD-10-CM | POA: Diagnosis not present

## 2017-02-04 DIAGNOSIS — Z86718 Personal history of other venous thrombosis and embolism: Secondary | ICD-10-CM | POA: Diagnosis not present

## 2017-02-08 DIAGNOSIS — R600 Localized edema: Secondary | ICD-10-CM | POA: Diagnosis not present

## 2017-02-08 DIAGNOSIS — I8312 Varicose veins of left lower extremity with inflammation: Secondary | ICD-10-CM | POA: Diagnosis not present

## 2017-02-10 DIAGNOSIS — R0789 Other chest pain: Secondary | ICD-10-CM | POA: Diagnosis not present

## 2017-02-10 DIAGNOSIS — I10 Essential (primary) hypertension: Secondary | ICD-10-CM | POA: Diagnosis not present

## 2017-02-10 DIAGNOSIS — E039 Hypothyroidism, unspecified: Secondary | ICD-10-CM | POA: Diagnosis not present

## 2017-02-10 DIAGNOSIS — J479 Bronchiectasis, uncomplicated: Secondary | ICD-10-CM | POA: Diagnosis not present

## 2017-02-10 DIAGNOSIS — R0781 Pleurodynia: Secondary | ICD-10-CM | POA: Diagnosis not present

## 2017-02-10 DIAGNOSIS — Z79899 Other long term (current) drug therapy: Secondary | ICD-10-CM | POA: Diagnosis not present

## 2017-02-10 DIAGNOSIS — Z86718 Personal history of other venous thrombosis and embolism: Secondary | ICD-10-CM | POA: Diagnosis not present

## 2017-02-10 DIAGNOSIS — R0902 Hypoxemia: Secondary | ICD-10-CM | POA: Diagnosis not present

## 2017-02-10 DIAGNOSIS — M1990 Unspecified osteoarthritis, unspecified site: Secondary | ICD-10-CM | POA: Diagnosis not present

## 2017-02-10 DIAGNOSIS — T148XXA Other injury of unspecified body region, initial encounter: Secondary | ICD-10-CM | POA: Diagnosis not present

## 2017-02-10 DIAGNOSIS — E876 Hypokalemia: Secondary | ICD-10-CM | POA: Diagnosis not present

## 2017-02-10 DIAGNOSIS — E7800 Pure hypercholesterolemia, unspecified: Secondary | ICD-10-CM | POA: Diagnosis not present

## 2017-02-10 DIAGNOSIS — G629 Polyneuropathy, unspecified: Secondary | ICD-10-CM | POA: Diagnosis not present

## 2017-02-10 DIAGNOSIS — Z888 Allergy status to other drugs, medicaments and biological substances status: Secondary | ICD-10-CM | POA: Diagnosis not present

## 2017-02-10 DIAGNOSIS — Z881 Allergy status to other antibiotic agents status: Secondary | ICD-10-CM | POA: Diagnosis not present

## 2017-02-10 DIAGNOSIS — J189 Pneumonia, unspecified organism: Secondary | ICD-10-CM | POA: Diagnosis not present

## 2017-02-19 DIAGNOSIS — E876 Hypokalemia: Secondary | ICD-10-CM | POA: Diagnosis not present

## 2017-02-19 DIAGNOSIS — R0789 Other chest pain: Secondary | ICD-10-CM | POA: Diagnosis not present

## 2017-02-19 DIAGNOSIS — E039 Hypothyroidism, unspecified: Secondary | ICD-10-CM | POA: Diagnosis not present

## 2017-02-19 DIAGNOSIS — Z86718 Personal history of other venous thrombosis and embolism: Secondary | ICD-10-CM | POA: Diagnosis not present

## 2017-02-19 DIAGNOSIS — M6281 Muscle weakness (generalized): Secondary | ICD-10-CM | POA: Diagnosis not present

## 2017-02-19 DIAGNOSIS — M1990 Unspecified osteoarthritis, unspecified site: Secondary | ICD-10-CM | POA: Diagnosis not present

## 2017-02-19 DIAGNOSIS — I10 Essential (primary) hypertension: Secondary | ICD-10-CM | POA: Diagnosis not present

## 2017-02-19 DIAGNOSIS — J45909 Unspecified asthma, uncomplicated: Secondary | ICD-10-CM | POA: Diagnosis not present

## 2017-02-19 DIAGNOSIS — Z9181 History of falling: Secondary | ICD-10-CM | POA: Diagnosis not present

## 2017-02-19 DIAGNOSIS — Z7982 Long term (current) use of aspirin: Secondary | ICD-10-CM | POA: Diagnosis not present

## 2017-02-19 DIAGNOSIS — J479 Bronchiectasis, uncomplicated: Secondary | ICD-10-CM | POA: Diagnosis not present

## 2017-02-19 DIAGNOSIS — E785 Hyperlipidemia, unspecified: Secondary | ICD-10-CM | POA: Diagnosis not present

## 2017-02-20 DIAGNOSIS — E039 Hypothyroidism, unspecified: Secondary | ICD-10-CM | POA: Diagnosis not present

## 2017-02-20 DIAGNOSIS — R195 Other fecal abnormalities: Secondary | ICD-10-CM | POA: Diagnosis not present

## 2017-02-20 DIAGNOSIS — Z86718 Personal history of other venous thrombosis and embolism: Secondary | ICD-10-CM | POA: Diagnosis not present

## 2017-02-20 DIAGNOSIS — R0789 Other chest pain: Secondary | ICD-10-CM | POA: Diagnosis not present

## 2017-02-20 DIAGNOSIS — A0471 Enterocolitis due to Clostridium difficile, recurrent: Secondary | ICD-10-CM | POA: Diagnosis not present

## 2017-02-20 DIAGNOSIS — J45909 Unspecified asthma, uncomplicated: Secondary | ICD-10-CM | POA: Diagnosis not present

## 2017-02-20 DIAGNOSIS — M6281 Muscle weakness (generalized): Secondary | ICD-10-CM | POA: Diagnosis not present

## 2017-02-20 DIAGNOSIS — Z9181 History of falling: Secondary | ICD-10-CM | POA: Diagnosis not present

## 2017-02-20 DIAGNOSIS — Z7982 Long term (current) use of aspirin: Secondary | ICD-10-CM | POA: Diagnosis not present

## 2017-02-20 DIAGNOSIS — N390 Urinary tract infection, site not specified: Secondary | ICD-10-CM | POA: Diagnosis not present

## 2017-02-20 DIAGNOSIS — I10 Essential (primary) hypertension: Secondary | ICD-10-CM | POA: Diagnosis not present

## 2017-02-20 DIAGNOSIS — E785 Hyperlipidemia, unspecified: Secondary | ICD-10-CM | POA: Diagnosis not present

## 2017-02-20 DIAGNOSIS — M1990 Unspecified osteoarthritis, unspecified site: Secondary | ICD-10-CM | POA: Diagnosis not present

## 2017-02-20 DIAGNOSIS — E876 Hypokalemia: Secondary | ICD-10-CM | POA: Diagnosis not present

## 2017-02-20 DIAGNOSIS — J479 Bronchiectasis, uncomplicated: Secondary | ICD-10-CM | POA: Diagnosis not present

## 2017-02-21 DIAGNOSIS — R197 Diarrhea, unspecified: Secondary | ICD-10-CM | POA: Diagnosis not present

## 2017-02-22 DIAGNOSIS — M1990 Unspecified osteoarthritis, unspecified site: Secondary | ICD-10-CM | POA: Diagnosis not present

## 2017-02-22 DIAGNOSIS — Z7982 Long term (current) use of aspirin: Secondary | ICD-10-CM | POA: Diagnosis not present

## 2017-02-22 DIAGNOSIS — E039 Hypothyroidism, unspecified: Secondary | ICD-10-CM | POA: Diagnosis not present

## 2017-02-22 DIAGNOSIS — E785 Hyperlipidemia, unspecified: Secondary | ICD-10-CM | POA: Diagnosis not present

## 2017-02-22 DIAGNOSIS — E876 Hypokalemia: Secondary | ICD-10-CM | POA: Diagnosis not present

## 2017-02-22 DIAGNOSIS — Z86718 Personal history of other venous thrombosis and embolism: Secondary | ICD-10-CM | POA: Diagnosis not present

## 2017-02-22 DIAGNOSIS — R0789 Other chest pain: Secondary | ICD-10-CM | POA: Diagnosis not present

## 2017-02-22 DIAGNOSIS — J189 Pneumonia, unspecified organism: Secondary | ICD-10-CM | POA: Diagnosis not present

## 2017-02-22 DIAGNOSIS — Z79899 Other long term (current) drug therapy: Secondary | ICD-10-CM | POA: Diagnosis not present

## 2017-02-22 DIAGNOSIS — J479 Bronchiectasis, uncomplicated: Secondary | ICD-10-CM | POA: Diagnosis not present

## 2017-02-22 DIAGNOSIS — I10 Essential (primary) hypertension: Secondary | ICD-10-CM | POA: Diagnosis not present

## 2017-02-22 DIAGNOSIS — J45909 Unspecified asthma, uncomplicated: Secondary | ICD-10-CM | POA: Diagnosis not present

## 2017-02-22 DIAGNOSIS — Z9181 History of falling: Secondary | ICD-10-CM | POA: Diagnosis not present

## 2017-02-22 DIAGNOSIS — M6281 Muscle weakness (generalized): Secondary | ICD-10-CM | POA: Diagnosis not present

## 2017-02-25 DIAGNOSIS — Z7982 Long term (current) use of aspirin: Secondary | ICD-10-CM | POA: Diagnosis not present

## 2017-02-25 DIAGNOSIS — M1990 Unspecified osteoarthritis, unspecified site: Secondary | ICD-10-CM | POA: Diagnosis not present

## 2017-02-25 DIAGNOSIS — M6281 Muscle weakness (generalized): Secondary | ICD-10-CM | POA: Diagnosis not present

## 2017-02-25 DIAGNOSIS — R0789 Other chest pain: Secondary | ICD-10-CM | POA: Diagnosis not present

## 2017-02-25 DIAGNOSIS — Z86718 Personal history of other venous thrombosis and embolism: Secondary | ICD-10-CM | POA: Diagnosis not present

## 2017-02-25 DIAGNOSIS — J45909 Unspecified asthma, uncomplicated: Secondary | ICD-10-CM | POA: Diagnosis not present

## 2017-02-25 DIAGNOSIS — I10 Essential (primary) hypertension: Secondary | ICD-10-CM | POA: Diagnosis not present

## 2017-02-25 DIAGNOSIS — Z9181 History of falling: Secondary | ICD-10-CM | POA: Diagnosis not present

## 2017-02-25 DIAGNOSIS — J479 Bronchiectasis, uncomplicated: Secondary | ICD-10-CM | POA: Diagnosis not present

## 2017-02-25 DIAGNOSIS — E785 Hyperlipidemia, unspecified: Secondary | ICD-10-CM | POA: Diagnosis not present

## 2017-02-25 DIAGNOSIS — E876 Hypokalemia: Secondary | ICD-10-CM | POA: Diagnosis not present

## 2017-02-25 DIAGNOSIS — E039 Hypothyroidism, unspecified: Secondary | ICD-10-CM | POA: Diagnosis not present

## 2017-02-26 DIAGNOSIS — Z86718 Personal history of other venous thrombosis and embolism: Secondary | ICD-10-CM | POA: Diagnosis not present

## 2017-02-26 DIAGNOSIS — E039 Hypothyroidism, unspecified: Secondary | ICD-10-CM | POA: Diagnosis not present

## 2017-02-26 DIAGNOSIS — M1990 Unspecified osteoarthritis, unspecified site: Secondary | ICD-10-CM | POA: Diagnosis not present

## 2017-02-26 DIAGNOSIS — E785 Hyperlipidemia, unspecified: Secondary | ICD-10-CM | POA: Diagnosis not present

## 2017-02-26 DIAGNOSIS — Z7982 Long term (current) use of aspirin: Secondary | ICD-10-CM | POA: Diagnosis not present

## 2017-02-26 DIAGNOSIS — M6281 Muscle weakness (generalized): Secondary | ICD-10-CM | POA: Diagnosis not present

## 2017-02-26 DIAGNOSIS — J479 Bronchiectasis, uncomplicated: Secondary | ICD-10-CM | POA: Diagnosis not present

## 2017-02-26 DIAGNOSIS — I10 Essential (primary) hypertension: Secondary | ICD-10-CM | POA: Diagnosis not present

## 2017-02-26 DIAGNOSIS — Z9181 History of falling: Secondary | ICD-10-CM | POA: Diagnosis not present

## 2017-02-26 DIAGNOSIS — E876 Hypokalemia: Secondary | ICD-10-CM | POA: Diagnosis not present

## 2017-02-26 DIAGNOSIS — R0789 Other chest pain: Secondary | ICD-10-CM | POA: Diagnosis not present

## 2017-02-26 DIAGNOSIS — J45909 Unspecified asthma, uncomplicated: Secondary | ICD-10-CM | POA: Diagnosis not present

## 2017-02-28 DIAGNOSIS — M6281 Muscle weakness (generalized): Secondary | ICD-10-CM | POA: Diagnosis not present

## 2017-02-28 DIAGNOSIS — J479 Bronchiectasis, uncomplicated: Secondary | ICD-10-CM | POA: Diagnosis not present

## 2017-02-28 DIAGNOSIS — E785 Hyperlipidemia, unspecified: Secondary | ICD-10-CM | POA: Diagnosis not present

## 2017-02-28 DIAGNOSIS — M1990 Unspecified osteoarthritis, unspecified site: Secondary | ICD-10-CM | POA: Diagnosis not present

## 2017-02-28 DIAGNOSIS — R0789 Other chest pain: Secondary | ICD-10-CM | POA: Diagnosis not present

## 2017-02-28 DIAGNOSIS — I10 Essential (primary) hypertension: Secondary | ICD-10-CM | POA: Diagnosis not present

## 2017-02-28 DIAGNOSIS — J45909 Unspecified asthma, uncomplicated: Secondary | ICD-10-CM | POA: Diagnosis not present

## 2017-02-28 DIAGNOSIS — Z9181 History of falling: Secondary | ICD-10-CM | POA: Diagnosis not present

## 2017-02-28 DIAGNOSIS — E039 Hypothyroidism, unspecified: Secondary | ICD-10-CM | POA: Diagnosis not present

## 2017-02-28 DIAGNOSIS — Z7982 Long term (current) use of aspirin: Secondary | ICD-10-CM | POA: Diagnosis not present

## 2017-02-28 DIAGNOSIS — E876 Hypokalemia: Secondary | ICD-10-CM | POA: Diagnosis not present

## 2017-02-28 DIAGNOSIS — Z86718 Personal history of other venous thrombosis and embolism: Secondary | ICD-10-CM | POA: Diagnosis not present

## 2017-03-02 DIAGNOSIS — M6281 Muscle weakness (generalized): Secondary | ICD-10-CM | POA: Diagnosis not present

## 2017-03-04 DIAGNOSIS — R0789 Other chest pain: Secondary | ICD-10-CM | POA: Diagnosis not present

## 2017-03-04 DIAGNOSIS — I10 Essential (primary) hypertension: Secondary | ICD-10-CM | POA: Diagnosis not present

## 2017-03-04 DIAGNOSIS — E039 Hypothyroidism, unspecified: Secondary | ICD-10-CM | POA: Diagnosis not present

## 2017-03-04 DIAGNOSIS — Z9181 History of falling: Secondary | ICD-10-CM | POA: Diagnosis not present

## 2017-03-04 DIAGNOSIS — M1990 Unspecified osteoarthritis, unspecified site: Secondary | ICD-10-CM | POA: Diagnosis not present

## 2017-03-04 DIAGNOSIS — Z7982 Long term (current) use of aspirin: Secondary | ICD-10-CM | POA: Diagnosis not present

## 2017-03-04 DIAGNOSIS — E785 Hyperlipidemia, unspecified: Secondary | ICD-10-CM | POA: Diagnosis not present

## 2017-03-04 DIAGNOSIS — E876 Hypokalemia: Secondary | ICD-10-CM | POA: Diagnosis not present

## 2017-03-04 DIAGNOSIS — J45909 Unspecified asthma, uncomplicated: Secondary | ICD-10-CM | POA: Diagnosis not present

## 2017-03-04 DIAGNOSIS — J479 Bronchiectasis, uncomplicated: Secondary | ICD-10-CM | POA: Diagnosis not present

## 2017-03-04 DIAGNOSIS — Z86718 Personal history of other venous thrombosis and embolism: Secondary | ICD-10-CM | POA: Diagnosis not present

## 2017-03-04 DIAGNOSIS — M6281 Muscle weakness (generalized): Secondary | ICD-10-CM | POA: Diagnosis not present

## 2017-03-05 DIAGNOSIS — E785 Hyperlipidemia, unspecified: Secondary | ICD-10-CM | POA: Diagnosis not present

## 2017-03-05 DIAGNOSIS — E039 Hypothyroidism, unspecified: Secondary | ICD-10-CM | POA: Diagnosis not present

## 2017-03-05 DIAGNOSIS — Z86718 Personal history of other venous thrombosis and embolism: Secondary | ICD-10-CM | POA: Diagnosis not present

## 2017-03-05 DIAGNOSIS — R0789 Other chest pain: Secondary | ICD-10-CM | POA: Diagnosis not present

## 2017-03-05 DIAGNOSIS — J45909 Unspecified asthma, uncomplicated: Secondary | ICD-10-CM | POA: Diagnosis not present

## 2017-03-05 DIAGNOSIS — E876 Hypokalemia: Secondary | ICD-10-CM | POA: Diagnosis not present

## 2017-03-05 DIAGNOSIS — Z9181 History of falling: Secondary | ICD-10-CM | POA: Diagnosis not present

## 2017-03-05 DIAGNOSIS — M6281 Muscle weakness (generalized): Secondary | ICD-10-CM | POA: Diagnosis not present

## 2017-03-05 DIAGNOSIS — J479 Bronchiectasis, uncomplicated: Secondary | ICD-10-CM | POA: Diagnosis not present

## 2017-03-05 DIAGNOSIS — I10 Essential (primary) hypertension: Secondary | ICD-10-CM | POA: Diagnosis not present

## 2017-03-05 DIAGNOSIS — M1990 Unspecified osteoarthritis, unspecified site: Secondary | ICD-10-CM | POA: Diagnosis not present

## 2017-03-05 DIAGNOSIS — Z7982 Long term (current) use of aspirin: Secondary | ICD-10-CM | POA: Diagnosis not present

## 2017-03-07 DIAGNOSIS — R600 Localized edema: Secondary | ICD-10-CM | POA: Diagnosis not present

## 2017-03-07 DIAGNOSIS — R0789 Other chest pain: Secondary | ICD-10-CM | POA: Diagnosis not present

## 2017-03-07 DIAGNOSIS — J479 Bronchiectasis, uncomplicated: Secondary | ICD-10-CM | POA: Diagnosis not present

## 2017-03-07 DIAGNOSIS — Z7982 Long term (current) use of aspirin: Secondary | ICD-10-CM | POA: Diagnosis not present

## 2017-03-07 DIAGNOSIS — M6281 Muscle weakness (generalized): Secondary | ICD-10-CM | POA: Diagnosis not present

## 2017-03-07 DIAGNOSIS — M1990 Unspecified osteoarthritis, unspecified site: Secondary | ICD-10-CM | POA: Diagnosis not present

## 2017-03-07 DIAGNOSIS — E039 Hypothyroidism, unspecified: Secondary | ICD-10-CM | POA: Diagnosis not present

## 2017-03-07 DIAGNOSIS — Z9181 History of falling: Secondary | ICD-10-CM | POA: Diagnosis not present

## 2017-03-07 DIAGNOSIS — J45909 Unspecified asthma, uncomplicated: Secondary | ICD-10-CM | POA: Diagnosis not present

## 2017-03-07 DIAGNOSIS — I10 Essential (primary) hypertension: Secondary | ICD-10-CM | POA: Diagnosis not present

## 2017-03-07 DIAGNOSIS — E876 Hypokalemia: Secondary | ICD-10-CM | POA: Diagnosis not present

## 2017-03-07 DIAGNOSIS — M79662 Pain in left lower leg: Secondary | ICD-10-CM | POA: Diagnosis not present

## 2017-03-07 DIAGNOSIS — E785 Hyperlipidemia, unspecified: Secondary | ICD-10-CM | POA: Diagnosis not present

## 2017-03-07 DIAGNOSIS — Z86718 Personal history of other venous thrombosis and embolism: Secondary | ICD-10-CM | POA: Diagnosis not present

## 2017-03-11 ENCOUNTER — Encounter: Payer: Self-pay | Admitting: Cardiovascular Disease

## 2017-03-11 DIAGNOSIS — E039 Hypothyroidism, unspecified: Secondary | ICD-10-CM | POA: Diagnosis not present

## 2017-03-11 DIAGNOSIS — J479 Bronchiectasis, uncomplicated: Secondary | ICD-10-CM | POA: Diagnosis not present

## 2017-03-11 DIAGNOSIS — I10 Essential (primary) hypertension: Secondary | ICD-10-CM | POA: Diagnosis not present

## 2017-03-11 DIAGNOSIS — M6281 Muscle weakness (generalized): Secondary | ICD-10-CM | POA: Diagnosis not present

## 2017-03-11 DIAGNOSIS — R0789 Other chest pain: Secondary | ICD-10-CM | POA: Diagnosis not present

## 2017-03-11 DIAGNOSIS — E785 Hyperlipidemia, unspecified: Secondary | ICD-10-CM | POA: Diagnosis not present

## 2017-03-11 DIAGNOSIS — Z86718 Personal history of other venous thrombosis and embolism: Secondary | ICD-10-CM | POA: Diagnosis not present

## 2017-03-11 DIAGNOSIS — Z9181 History of falling: Secondary | ICD-10-CM | POA: Diagnosis not present

## 2017-03-11 DIAGNOSIS — M1990 Unspecified osteoarthritis, unspecified site: Secondary | ICD-10-CM | POA: Diagnosis not present

## 2017-03-11 DIAGNOSIS — E876 Hypokalemia: Secondary | ICD-10-CM | POA: Diagnosis not present

## 2017-03-11 DIAGNOSIS — J45909 Unspecified asthma, uncomplicated: Secondary | ICD-10-CM | POA: Diagnosis not present

## 2017-03-11 DIAGNOSIS — Z7982 Long term (current) use of aspirin: Secondary | ICD-10-CM | POA: Diagnosis not present

## 2017-03-13 DIAGNOSIS — M1990 Unspecified osteoarthritis, unspecified site: Secondary | ICD-10-CM | POA: Diagnosis not present

## 2017-03-13 DIAGNOSIS — J479 Bronchiectasis, uncomplicated: Secondary | ICD-10-CM | POA: Diagnosis not present

## 2017-03-13 DIAGNOSIS — Z86718 Personal history of other venous thrombosis and embolism: Secondary | ICD-10-CM | POA: Diagnosis not present

## 2017-03-13 DIAGNOSIS — J45909 Unspecified asthma, uncomplicated: Secondary | ICD-10-CM | POA: Diagnosis not present

## 2017-03-13 DIAGNOSIS — R0789 Other chest pain: Secondary | ICD-10-CM | POA: Diagnosis not present

## 2017-03-13 DIAGNOSIS — E039 Hypothyroidism, unspecified: Secondary | ICD-10-CM | POA: Diagnosis not present

## 2017-03-13 DIAGNOSIS — Z7982 Long term (current) use of aspirin: Secondary | ICD-10-CM | POA: Diagnosis not present

## 2017-03-13 DIAGNOSIS — Z9181 History of falling: Secondary | ICD-10-CM | POA: Diagnosis not present

## 2017-03-13 DIAGNOSIS — E876 Hypokalemia: Secondary | ICD-10-CM | POA: Diagnosis not present

## 2017-03-13 DIAGNOSIS — I10 Essential (primary) hypertension: Secondary | ICD-10-CM | POA: Diagnosis not present

## 2017-03-13 DIAGNOSIS — E785 Hyperlipidemia, unspecified: Secondary | ICD-10-CM | POA: Diagnosis not present

## 2017-03-13 DIAGNOSIS — M6281 Muscle weakness (generalized): Secondary | ICD-10-CM | POA: Diagnosis not present

## 2017-03-14 ENCOUNTER — Telehealth: Payer: Self-pay | Admitting: Cardiovascular Disease

## 2017-03-14 DIAGNOSIS — M1990 Unspecified osteoarthritis, unspecified site: Secondary | ICD-10-CM | POA: Diagnosis not present

## 2017-03-14 DIAGNOSIS — R0789 Other chest pain: Secondary | ICD-10-CM | POA: Diagnosis not present

## 2017-03-14 DIAGNOSIS — E876 Hypokalemia: Secondary | ICD-10-CM | POA: Diagnosis not present

## 2017-03-14 DIAGNOSIS — I10 Essential (primary) hypertension: Secondary | ICD-10-CM | POA: Diagnosis not present

## 2017-03-14 DIAGNOSIS — Z86718 Personal history of other venous thrombosis and embolism: Secondary | ICD-10-CM | POA: Diagnosis not present

## 2017-03-14 DIAGNOSIS — M6281 Muscle weakness (generalized): Secondary | ICD-10-CM | POA: Diagnosis not present

## 2017-03-14 DIAGNOSIS — J479 Bronchiectasis, uncomplicated: Secondary | ICD-10-CM | POA: Diagnosis not present

## 2017-03-14 DIAGNOSIS — E039 Hypothyroidism, unspecified: Secondary | ICD-10-CM | POA: Diagnosis not present

## 2017-03-14 DIAGNOSIS — E785 Hyperlipidemia, unspecified: Secondary | ICD-10-CM | POA: Diagnosis not present

## 2017-03-14 DIAGNOSIS — Z7982 Long term (current) use of aspirin: Secondary | ICD-10-CM | POA: Diagnosis not present

## 2017-03-14 DIAGNOSIS — Z9181 History of falling: Secondary | ICD-10-CM | POA: Diagnosis not present

## 2017-03-14 DIAGNOSIS — J45909 Unspecified asthma, uncomplicated: Secondary | ICD-10-CM | POA: Diagnosis not present

## 2017-03-14 NOTE — Telephone Encounter (Signed)
Patient states her feet have been swelling for about 2 weeks.  Her weight is up from 88 pounds to 93 pounds in the last 2 weeks as well. She has no SOB, she eats no salt, she elevates her legs during the day. She is taking her medications as directed. She states her regular shoes don't fit and she has to wear her bedroom slippers. Scheduled patient 5/29 with K. Janee Mornhompson, [place] for [place]. She understands to call if weight increases more or if other symptoms occur. She was grateful for call and agrees with treatment plan.

## 2017-03-14 NOTE — Telephone Encounter (Signed)
F/U call  [facility] requests that we contact patient [phone].

## 2017-03-14 NOTE — Telephone Encounter (Signed)
[facility] states that Dr. Sedalia Mutaox would like patient to be seen sooner than scheduled appt on 05-02-17 for edema and weakness. States that patient is not feeling good. Thanks.

## 2017-03-18 NOTE — Progress Notes (Signed)
Cardiology Office Note    Date:  03/19/2017   ID:  RYIN SCHILLO, DOB 06-Feb-1932, MRN [id number]  PCP:  Blane Ohara, MD  Cardiologist:  Dr. Eden Emms  CC: LE edema and weight gain    History of Present Illness:  Stephanie Clay is a 83 y.o. female with a history of HTN, HLD, CKD, asthma with bronchiectasis, DVT on Eliquis and hypothyroidism who presents to clinic for evaluation of weight gain and worsening LE edema.   Patient has a history of bronchiectasis and possible mycobacterial disease based on micronodular disease on CT scan of the chest. Cultures have never grown out Mycobacterium. She is followed by Dr. Delton Coombes.  She has been seen by Dr. Eden Emms in the past for "CHF" and chest pain. He felt LE edema was from chronic lung disease and venous insufficiency. Chest pain felt to be atypical. Echo 12/07/14 showed Septum 9 mm, LA 3.2 cm  EF 63%  Normal diastolic filling pattern for age, mild AR, estimated PA pressure 50 mmHg.  She was diagnosed with a LLE DVT in 10/2016 and placed on Eliquis.  Today she presents to clinic for evaluation of LE edema and weight gain. She has had lower extremity edema in her feet and legs up to her knees. She has been wearing bedroom slippers because she cannot put on her shoes. She also has had abdominal distension. No orthopnea or PND. She usually takes lasix 20mg  BID but she took 40mg  BID x2 days which helped a lot. Still has swelling in legs but much improved. She denies chest pain but gets significant dyspnea on exertion to the point that she gets tired with even light house work. Occasional dizziness but no syncope. No blood in stool or urine. No palpitations.     Past Medical History:  Diagnosis Date  . Asthma   . Bronchiectasis without acute exacerbation (HCC) 01/03/2015   CT chest 12/2014:  Bronchiectasis throughout right lung, left clear.  +tree and bud, + patchy gg infiltrates in RLL Sputum 2016: no growth, AFB smear neg PFT's 02/2015:  Normal except mild  decrease in DLCO   . Chronic chest pain   . Gastro-esophageal reflux   . Hyperlipemia   . Iron (Fe) deficiency anemia   . Pneumathemia (HCC)   . Scoliosis   . Secondary hyperparathyroidism (HCC)   . Varicose veins    Bilateral leg    Past Surgical History:  Procedure Laterality Date  . ABDOMINAL HYSTERECTOMY    . CYSTOCELE REPAIR    . SALPINGOOPHORECTOMY      Current Medications: Outpatient Medications Prior to Visit  Medication Sig Dispense Refill  . albuterol (PROVENTIL HFA;VENTOLIN HFA) 108 (90 Base) MCG/ACT inhaler Inhale 2 puffs into the lungs every 6 (six) hours as needed for wheezing or shortness of breath.    . ALPRAZolam (XANAX) 0.5 MG tablet Take 0.5 mg by mouth 2 (two) times daily as needed for anxiety.    . dicyclomine (BENTYL) 10 MG capsule Take 10 mg by mouth 4 (four) times daily -  before meals and at bedtime.    . fluticasone (FLONASE) 50 MCG/ACT nasal spray Place 2 sprays into both nostrils as needed.     . gabapentin (NEURONTIN) 100 MG capsule Take 100 mg by mouth 3 (three) times daily.  2  . HYDROcodone-acetaminophen (NORCO/VICODIN) 5-325 MG tablet Take 1 tablet by mouth every 6 (six) hours as needed for moderate pain.    . hydrOXYzine (ATARAX/VISTARIL) 25 MG tablet Take 25  mg by mouth 3 (three) times daily as needed.    Marland Kitchen levothyroxine (SYNTHROID, LEVOTHROID) 50 MCG tablet Take 50 mcg by mouth daily before breakfast.    . meclizine (ANTIVERT) 12.5 MG tablet Take 12.5 mg by mouth every 8 (eight) hours as needed for dizziness.  1  . pantoprazole (PROTONIX) 40 MG tablet Take 40 mg by mouth daily.    . simvastatin (ZOCOR) 40 MG tablet Take 40 mg by mouth daily.     Marland Kitchen triamcinolone cream (KENALOG) 0.1 % Apply 1 application topically 2 (two) times daily as needed.     . furosemide (LASIX) 20 MG tablet Take 20 mg by mouth 2 (two) times daily.  2  . apixaban (ELIQUIS) 2.5 MG TABS tablet Take 2.5 mg by mouth 2 (two) times daily.    Marland Kitchen azithromycin (ZITHROMAX) 250 MG  tablet Take 250 mg by mouth daily.    . potassium chloride SA (K-DUR,KLOR-CON) 20 MEQ tablet Take 1 tablet (20 mEq total) by mouth 2 (two) times daily.     No facility-administered medications prior to visit.      Allergies:   Ciprofloxacin; Levofloxacin; and Nitrofurantoin   Social History   Social History  . Marital status: Married    Spouse name: carl  . Number of children: 3  . Years of education: 9   Occupational History  . retired    Social History Main Topics  . Smoking status: Never Smoker  . Smokeless tobacco: Never Used  . Alcohol use No  . Drug use: No  . Sexual activity: Not Asked   Other Topics Concern  . None   Social History Narrative  . None     Family History:  The patient's family history includes Cancer in her sister; Emphysema in her father; Heart disease in her mother and sister; Hyperlipidemia in her daughter; Hypertension in her daughter and sister; Varicose Veins in her sister.      ROS:   Please see the history of present illness.    ROS All other systems reviewed and are negative.   PHYSICAL EXAM:   VS:  BP 140/68   Pulse 60   Ht 4\' 11"  (1.499 m)   Wt 90 lb 1.9 oz (40.9 kg)   BMI 18.20 kg/m    GEN: Well nourished, well developed, in no acute distress, elderly and frail appearing HEENT: normal  Neck: +  JVD to jawline,no carotid bruits, or masses Cardiac: RRR; no murmurs, rubs, or gallops, 1-2+ bilateral LE edema  Respiratory:  clear to auscultation bilaterally, normal work of breathing GI: soft, nontender, nondistended, + BS MS: no deformity or atrophy  Skin: warm and dry, no rash Neuro:  Alert and Oriented x 3, Strength and sensation are intact Psych: euthymic mood, full affect    Wt Readings from Last 3 Encounters:  03/19/17 90 lb 1.9 oz (40.9 kg)  01/16/17 90 lb 6.4 oz (41 kg)  11/16/16 95 lb 3.2 oz (43.2 kg)      Studies/Labs Reviewed:   EKG:  EKG is ordered today.  The ekg ordered today demonstrates NSR HR 60 with  PACs with baseline artifact  Recent Labs:  07/02/2016: Magnesium 2.3 07/07/2016: ALT 14; B Natriuretic Peptide 183.3; BUN 14; Creatinine, Ser 0.88; Hemoglobin 10.9; Platelets 214; Potassium 4.3; Sodium 141   Lipid Panel No results found for: CHOL, TRIG, HDL, CHOLHDL, VLDL, LDLCALC, LDLDIRECT  Additional studies/ records that were reviewed today include:   Echo 12/07/14: Septum 9 mm  LA 3.2  cm  EF 63%  Normal diastolic filling pattern for age mild AR estimated PA pressure 50 mmHg   ASSESSMENT & PLAN:   LE edema/weight gain: she has had some improvement with increasing lasix at home from 20mg  BID to 40mg  BID. She still has evidence of volume overload with elevated JVD and LE edema. Will continue lasix 40mg  BID x2 more days. Will check a BMET and BNP and update 2D ECHO.   DOE: she gets very SOB with light household activities. This predated her current issues with LE edema.  CT chest done in 12/2015 showed CAD in LAD terroitory. Dyspnea could be coming from her chronic lung disease, but will rule out coronary ischemia with a lesixscan myoview  HTN: BP with moderate control 140/68  Bronchiectasis: followed by pulmonary.   DVT: continue Eliquis 5mg  BID  HLD: continue statin    Medication Adjustments/Labs and Tests Ordered: Current medicines are reviewed at length with the patient today.  Concerns regarding medicines are outlined above.  Medication changes, Labs and Tests ordered today are listed in the Patient Instructions below. Patient Instructions  Medication Instructions:  Your physician has recommended you make the following change in your medication:  1.  INCREASE the Lasix to 20 mg taking 2 tablets twice a day for 2 days then go back to 1 tablet twice a day   Labwork: TODAY:  CMET & PRO BNP  Testing/Procedures: Your physician has requested that you have an echocardiogram BEFORE YOUR RETURN APPOINTMENT. Echocardiography is a painless test that uses sound waves to create images  of your heart. It provides your doctor with information about the size and shape of your heart and how well your heart's chambers and valves are working. This procedure takes approximately one hour. There are no restrictions for this procedure.  Your physician has requested that you have a lexiscan myoview BEFORE YOUR RETURN APPOINTMENT. For further information please visit [email or web address] Please follow instruction sheet, as given.    Follow-Up: Your physician recommends that you schedule a follow-up appointment in: 3 WEEKS WITH KATIE Rebbeca Sheperd, PA-C   Any Other Special Instructions Will Be Listed Below (If Applicable).  Echocardiogram An echocardiogram, or echocardiography, uses sound waves (ultrasound) to produce an image of your heart. The echocardiogram is simple, painless, obtained within a short period of time, and offers valuable information to your health care provider. The images from an echocardiogram can provide information such as:  Evidence of coronary artery disease (CAD).  Heart size.  Heart muscle function.  Heart valve function.  Aneurysm detection.  Evidence of a past heart attack.  Fluid buildup around the heart.  Heart muscle thickening.  Assess heart valve function. Tell a health care provider about:  Any allergies you have.  All medicines you are taking, including vitamins, herbs, eye drops, creams, and over-the-counter medicines.  Any problems you or family members have had with anesthetic medicines.  Any blood disorders you have.  Any surgeries you have had.  Any medical conditions you have.  Whether you are pregnant or may be pregnant. What happens before the procedure? No special preparation is needed. Eat and drink normally. What happens during the procedure?  In order to produce an image of your heart, gel will be applied to your chest and a wand-like tool (transducer) will be moved over your chest. The gel will help transmit the  sound waves from the transducer. The sound waves will harmlessly bounce off your heart to allow the heart images to be captured  in real-time motion. These images will then be recorded.  You may need an IV to receive a medicine that improves the quality of the pictures. What happens after the procedure? You may return to your normal schedule including diet, activities, and medicines, unless your health care provider tells you otherwise. This information is not intended to replace advice given to you by your health care provider. Make sure you discuss any questions you have with your health care provider. Document Released: 10/05/2000 Document Revised: 05/26/2016 Document Reviewed: 06/15/2013 Elsevier Interactive Patient Education  2017 Elsevier Inc.   Pharmacologic Stress Electrocardiogram Introduction A pharmacologic stress electrocardiogram is a heart (cardiac) test that uses nuclear imaging to evaluate the blood supply to your heart. This test may also be called a pharmacologic stress electrocardiography. Pharmacologic means that a medicine is used to increase your heart rate and blood pressure. This stress test is done to find areas of poor blood flow to the heart by determining the extent of coronary artery disease (CAD). Some people exercise on a treadmill, which naturally increases the blood flow to the heart. For those people unable to exercise on a treadmill, a medicine is used. This medicine stimulates your heart and will cause your heart to beat harder and more quickly, as if you were exercising. Pharmacologic stress tests can help determine:  The adequacy of blood flow to your heart during increased levels of activity in order to clear you for discharge home.  The extent of coronary artery blockage caused by CAD.  Your prognosis if you have suffered a heart attack.  The effectiveness of cardiac procedures done, such as an angioplasty, which can increase the circulation in your  coronary arteries.  Causes of chest pain or pressure. LET [facility] KNOW ABOUT:  Any allergies you have.  All medicines you are taking, including vitamins, herbs, eye drops, creams, and over-the-counter medicines.  Previous problems you or members of your family have had with the use of anesthetics.  Any blood disorders you have.  Previous surgeries you have had.  Medical conditions you have.  Possibility of pregnancy, if this applies.  If you are currently breastfeeding. RISKS AND COMPLICATIONS Generally, this is a safe procedure. However, as with any procedure, complications can occur. Possible complications include:  You develop pain or pressure in the following areas:  Chest.  Jaw or neck.  Between your shoulder blades.  Radiating down your left arm.  Headache.  Dizziness or light-headedness.  Shortness of breath.  Increased or irregular heartbeat.  Low blood pressure.  Nausea or vomiting.  Flushing.  Redness going up the arm and slight pain during injection of medicine.  Heart attack (rare). BEFORE THE PROCEDURE  Avoid all forms of caffeine for 24 hours before your test or as directed by your health care provider. This includes coffee, tea (even decaffeinated tea), caffeinated sodas, chocolate, cocoa, and certain pain medicines.  Follow your health care provider's instructions regarding eating and drinking before the test.  Take your medicines as directed at regular times with water unless instructed otherwise. Exceptions may include:  If you have diabetes, ask how you are to take your insulin or pills. It is common to adjust insulin dosing the morning of the test.  If you are taking beta-blocker medicines, it is important to talk to your health care provider about these medicines well before the date of your test. Taking beta-blocker medicines may interfere with the test. In some cases, these medicines need to be changed  or stopped 24  hours or more before the test.  If you wear a nitroglycerin patch, it may need to be removed prior to the test. Ask your health care provider if the patch should be removed before the test.  If you use an inhaler for any breathing condition, bring it with you to the test.  If you are an outpatient, bring a snack so you can eat right after the stress phase of the test.  Do not smoke for 4 hours prior to the test or as directed by your health care provider.  Do not apply lotions, powders, creams, or oils on your chest prior to the test.  Wear comfortable shoes and clothing. Let your health care provider know if you were unable to complete or follow the preparations for your test. PROCEDURE  Multiple patches (electrodes) will be put on your chest. If needed, small areas of your chest may be shaved to get better contact with the electrodes. Once the electrodes are attached to your body, multiple wires will be attached to the electrodes, and your heart rate will be monitored.  An IV access will be started. A nuclear trace (isotope) is given. The isotope may be given intravenously, or it may be swallowed. Nuclear refers to several types of radioactive isotopes, and the nuclear isotope lights up the arteries so that the nuclear images are clear. The isotope is absorbed by your body. This results in low radiation exposure.  A resting nuclear image is taken to show how your heart functions at rest.  A medicine is given through the IV access.  A second scan is done about 1 hour after the medicine injection and determines how your heart functions under stress.  During this stress phase, you will be connected to an electrocardiogram machine. Your blood pressure and oxygen levels will be monitored. What to expect after the procedure  Your heart rate and blood pressure will be monitored after the test.  You may return to your normal schedule, including diet,activities, and medicines, unless your  health care provider tells you otherwise. This information is not intended to replace advice given to you by your health care provider. Make sure you discuss any questions you have with your health care provider. Document Released: 02/24/2009 Document Revised: 03/15/2016 Document Reviewed: 04/16/2016 Elsevier Interactive Patient Education  2017 [employer].   If you need a refill on your cardiac medications before your next appointment, please call your pharmacy.      Signed, Stephanie Crock, PA-C  03/19/2017 2:40 PM    [facility] [address] [place], [place], [place]  [address] Phone: [phone]; Fax: [phone]

## 2017-03-19 ENCOUNTER — Encounter: Payer: Self-pay | Admitting: Physician Assistant

## 2017-03-19 ENCOUNTER — Ambulatory Visit (INDEPENDENT_AMBULATORY_CARE_PROVIDER_SITE_OTHER): Payer: Medicare Other | Admitting: Physician Assistant

## 2017-03-19 VITALS — BP 140/68 | HR 60 | Ht 59.0 in | Wt 90.1 lb

## 2017-03-19 DIAGNOSIS — J479 Bronchiectasis, uncomplicated: Secondary | ICD-10-CM

## 2017-03-19 DIAGNOSIS — I10 Essential (primary) hypertension: Secondary | ICD-10-CM | POA: Diagnosis not present

## 2017-03-19 DIAGNOSIS — Z86718 Personal history of other venous thrombosis and embolism: Secondary | ICD-10-CM

## 2017-03-19 DIAGNOSIS — E785 Hyperlipidemia, unspecified: Secondary | ICD-10-CM | POA: Diagnosis not present

## 2017-03-19 DIAGNOSIS — R0609 Other forms of dyspnea: Secondary | ICD-10-CM

## 2017-03-19 DIAGNOSIS — R600 Localized edema: Secondary | ICD-10-CM | POA: Diagnosis not present

## 2017-03-19 MED ORDER — FUROSEMIDE 20 MG PO TABS: ORAL_TABLET | ORAL | 3 refills | Status: DC

## 2017-03-19 NOTE — Patient Instructions (Addendum)
Medication Instructions:  Your physician has recommended you make the following change in your medication:  1.  INCREASE the Lasix to 20 mg taking 2 tablets twice a day for 2 days then go back to 1 tablet twice a day   Labwork: TODAY:  CMET & PRO BNP  Testing/Procedures: Your physician has requested that you have an echocardiogram BEFORE YOUR RETURN APPOINTMENT. Echocardiography is a painless test that uses sound waves to create images of your heart. It provides your doctor with information about the size and shape of your heart and how well your heart's chambers and valves are working. This procedure takes approximately one hour. There are no restrictions for this procedure.  Your physician has requested that you have a lexiscan myoview BEFORE YOUR RETURN APPOINTMENT. For further information please visit [email or web address] Please follow instruction sheet, as given.    Follow-Up: Your physician recommends that you schedule a follow-up appointment in: 3 WEEKS WITH KATIE THOMPSON, PA-C   Any Other Special Instructions Will Be Listed Below (If Applicable).  Echocardiogram An echocardiogram, or echocardiography, uses sound waves (ultrasound) to produce an image of your heart. The echocardiogram is simple, painless, obtained within a short period of time, and offers valuable information to your health care provider. The images from an echocardiogram can provide information such as:  Evidence of coronary artery disease (CAD).  Heart size.  Heart muscle function.  Heart valve function.  Aneurysm detection.  Evidence of a past heart attack.  Fluid buildup around the heart.  Heart muscle thickening.  Assess heart valve function. Tell a health care provider about:  Any allergies you have.  All medicines you are taking, including vitamins, herbs, eye drops, creams, and over-the-counter medicines.  Any problems you or family members have had with anesthetic medicines.  Any  blood disorders you have.  Any surgeries you have had.  Any medical conditions you have.  Whether you are pregnant or may be pregnant. What happens before the procedure? No special preparation is needed. Eat and drink normally. What happens during the procedure?  In order to produce an image of your heart, gel will be applied to your chest and a wand-like tool (transducer) will be moved over your chest. The gel will help transmit the sound waves from the transducer. The sound waves will harmlessly bounce off your heart to allow the heart images to be captured in real-time motion. These images will then be recorded.  You may need an IV to receive a medicine that improves the quality of the pictures. What happens after the procedure? You may return to your normal schedule including diet, activities, and medicines, unless your health care provider tells you otherwise. This information is not intended to replace advice given to you by your health care provider. Make sure you discuss any questions you have with your health care provider. Document Released: 10/05/2000 Document Revised: 05/26/2016 Document Reviewed: 06/15/2013 Elsevier Interactive Patient Education  2017 Elsevier Inc[employer]   Pharmacologic Stress Electrocardiogram Introduction A pharmacologic stress electrocardiogram is a heart (cardiac) test that uses nuclear imaging to evaluate the blood supply to your heart. This test may also be called a pharmacologic stress electrocardiography. Pharmacologic means that a medicine is used to increase your heart rate and blood pressure. This stress test is done to find areas of poor blood flow to the heart by determining the extent of coronary artery disease (CAD). Some people exercise on a treadmill, which naturally increases the blood flow to the heart. For those people  unable to exercise on a treadmill, a medicine is used. This medicine stimulates your heart and will cause your heart to beat harder  and more quickly, as if you were exercising. Pharmacologic stress tests can help determine:  The adequacy of blood flow to your heart during increased levels of activity in order to clear you for discharge home.  The extent of coronary artery blockage caused by CAD.  Your prognosis if you have suffered a heart attack.  The effectiveness of cardiac procedures done, such as an angioplasty, which can increase the circulation in your coronary arteries.  Causes of chest pain or pressure. LET [facility] PROVIDER KNOW ABOUT:  Any allergies you have.  All medicines you are taking, including vitamins, herbs, eye drops, creams, and over-the-counter medicines.  Previous problems you or members of your family have had with the use of anesthetics.  Any blood disorders you have.  Previous surgeries you have had.  Medical conditions you have.  Possibility of pregnancy, if this applies.  If you are currently breastfeeding. RISKS AND COMPLICATIONS Generally, this is a safe procedure. However, as with any procedure, complications can occur. Possible complications include:  You develop pain or pressure in the following areas:  Chest.  Jaw or neck.  Between your shoulder blades.  Radiating down your left arm.  Headache.  Dizziness or light-headedness.  Shortness of breath.  Increased or irregular heartbeat.  Low blood pressure.  Nausea or vomiting.  Flushing.  Redness going up the arm and slight pain during injection of medicine.  Heart attack (rare). BEFORE THE PROCEDURE  Avoid all forms of caffeine for 24 hours before your test or as directed by your health care provider. This includes coffee, tea (even decaffeinated tea), caffeinated sodas, chocolate, cocoa, and certain pain medicines.  Follow your health care provider's instructions regarding eating and drinking before the test.  Take your medicines as directed at regular times with water unless instructed  otherwise. Exceptions may include:  If you have diabetes, ask how you are to take your insulin or pills. It is common to adjust insulin dosing the morning of the test.  If you are taking beta-blocker medicines, it is important to talk to your health care provider about these medicines well before the date of your test. Taking beta-blocker medicines may interfere with the test. In some cases, these medicines need to be changed or stopped 24 hours or more before the test.  If you wear a nitroglycerin patch, it may need to be removed prior to the test. Ask your health care provider if the patch should be removed before the test.  If you use an inhaler for any breathing condition, bring it with you to the test.  If you are an outpatient, bring a snack so you can eat right after the stress phase of the test.  Do not smoke for 4 hours prior to the test or as directed by your health care provider.  Do not apply lotions, powders, creams, or oils on your chest prior to the test.  Wear comfortable shoes and clothing. Let your health care provider know if you were unable to complete or follow the preparations for your test. PROCEDURE  Multiple patches (electrodes) will be put on your chest. If needed, small areas of your chest may be shaved to get better contact with the electrodes. Once the electrodes are attached to your body, multiple wires will be attached to the electrodes, and your heart rate will be monitored.  An IV access will be started. A nuclear trace (isotope) is given. The isotope may be given intravenously, or it may be swallowed. Nuclear refers to several types of radioactive isotopes, and the nuclear isotope lights up the arteries so that the nuclear images are clear. The isotope is absorbed by your body. This results in low radiation exposure.  A resting nuclear image is taken to show how your heart functions at rest.  A medicine is given through the IV access.  A second scan is  done about 1 hour after the medicine injection and determines how your heart functions under stress.  During this stress phase, you will be connected to an electrocardiogram machine. Your blood pressure and oxygen levels will be monitored. What to expect after the procedure  Your heart rate and blood pressure will be monitored after the test.  You may return to your normal schedule, including diet,activities, and medicines, unless your health care provider tells you otherwise. This information is not intended to replace advice given to you by your health care provider. Make sure you discuss any questions you have with your health care provider. Document Released: 02/24/2009 Document Revised: 03/15/2016 Document Reviewed: 04/16/2016 Elsevier Interactive Patient Education  2017 [employer].   If you need a refill on your cardiac medications before your next appointment, please call your pharmacy.

## 2017-03-20 ENCOUNTER — Telehealth: Payer: Self-pay | Admitting: *Deleted

## 2017-03-20 DIAGNOSIS — Z79899 Other long term (current) drug therapy: Secondary | ICD-10-CM

## 2017-03-20 DIAGNOSIS — E785 Hyperlipidemia, unspecified: Secondary | ICD-10-CM | POA: Diagnosis not present

## 2017-03-20 DIAGNOSIS — J479 Bronchiectasis, uncomplicated: Secondary | ICD-10-CM | POA: Diagnosis not present

## 2017-03-20 DIAGNOSIS — M1990 Unspecified osteoarthritis, unspecified site: Secondary | ICD-10-CM | POA: Diagnosis not present

## 2017-03-20 DIAGNOSIS — R0789 Other chest pain: Secondary | ICD-10-CM | POA: Diagnosis not present

## 2017-03-20 DIAGNOSIS — J45909 Unspecified asthma, uncomplicated: Secondary | ICD-10-CM | POA: Diagnosis not present

## 2017-03-20 DIAGNOSIS — Z86718 Personal history of other venous thrombosis and embolism: Secondary | ICD-10-CM | POA: Diagnosis not present

## 2017-03-20 DIAGNOSIS — E876 Hypokalemia: Secondary | ICD-10-CM | POA: Diagnosis not present

## 2017-03-20 DIAGNOSIS — M6281 Muscle weakness (generalized): Secondary | ICD-10-CM | POA: Diagnosis not present

## 2017-03-20 DIAGNOSIS — Z7982 Long term (current) use of aspirin: Secondary | ICD-10-CM | POA: Diagnosis not present

## 2017-03-20 DIAGNOSIS — Z9181 History of falling: Secondary | ICD-10-CM | POA: Diagnosis not present

## 2017-03-20 DIAGNOSIS — E039 Hypothyroidism, unspecified: Secondary | ICD-10-CM | POA: Diagnosis not present

## 2017-03-20 DIAGNOSIS — I10 Essential (primary) hypertension: Secondary | ICD-10-CM | POA: Diagnosis not present

## 2017-03-20 LAB — COMPREHENSIVE METABOLIC PANEL
A/G RATIO: 1.5 (ref 1.2–2.2)
ALBUMIN: 3.9 g/dL (ref 3.5–4.7)
ALT: 10 IU/L (ref 0–32)
AST: 19 IU/L (ref 0–40)
Alkaline Phosphatase: 53 IU/L (ref 39–117)
BUN / CREAT RATIO: 18 (ref 12–28)
BUN: 18 mg/dL (ref 8–27)
Bilirubin Total: 0.3 mg/dL (ref 0.0–1.2)
CALCIUM: 9.3 mg/dL (ref 8.7–10.3)
CO2: 27 mmol/L (ref 18–29)
CREATININE: 1.02 mg/dL — AB (ref 0.57–1.00)
Chloride: 99 mmol/L (ref 96–106)
GFR calc Af Amer: 59 mL/min/{1.73_m2} — ABNORMAL LOW (ref >59)
GFR, EST NON AFRICAN AMERICAN: 51 mL/min/{1.73_m2} — AB (ref >59)
GLOBULIN, TOTAL: 2.6 g/dL (ref 1.5–4.5)
Glucose: 79 mg/dL (ref 65–99)
Potassium: 3.9 mmol/L (ref 3.5–5.2)
SODIUM: 142 mmol/L (ref 134–144)
Total Protein: 6.5 g/dL (ref 6.0–8.5)

## 2017-03-20 LAB — PRO B NATRIURETIC PEPTIDE: NT-Pro BNP: 602 pg/mL (ref 0–738)

## 2017-03-20 NOTE — Telephone Encounter (Signed)
-----   Message from Janetta HoraKathryn R Thompson, PA-C sent at 03/20/2017  9:05 AM EDT ----- Labs look okay. Lets continue our current plan of Lasix 40mg  BID x 2 day followed by normal dosing of 20mg  BID. Will get repeat BMET next week to make sure labs look stable

## 2017-03-21 DIAGNOSIS — Z9181 History of falling: Secondary | ICD-10-CM | POA: Diagnosis not present

## 2017-03-21 DIAGNOSIS — M1990 Unspecified osteoarthritis, unspecified site: Secondary | ICD-10-CM | POA: Diagnosis not present

## 2017-03-21 DIAGNOSIS — E876 Hypokalemia: Secondary | ICD-10-CM | POA: Diagnosis not present

## 2017-03-21 DIAGNOSIS — Z86718 Personal history of other venous thrombosis and embolism: Secondary | ICD-10-CM | POA: Diagnosis not present

## 2017-03-21 DIAGNOSIS — E785 Hyperlipidemia, unspecified: Secondary | ICD-10-CM | POA: Diagnosis not present

## 2017-03-21 DIAGNOSIS — R0789 Other chest pain: Secondary | ICD-10-CM | POA: Diagnosis not present

## 2017-03-21 DIAGNOSIS — E039 Hypothyroidism, unspecified: Secondary | ICD-10-CM | POA: Diagnosis not present

## 2017-03-21 DIAGNOSIS — J45909 Unspecified asthma, uncomplicated: Secondary | ICD-10-CM | POA: Diagnosis not present

## 2017-03-21 DIAGNOSIS — I10 Essential (primary) hypertension: Secondary | ICD-10-CM | POA: Diagnosis not present

## 2017-03-21 DIAGNOSIS — Z7982 Long term (current) use of aspirin: Secondary | ICD-10-CM | POA: Diagnosis not present

## 2017-03-21 DIAGNOSIS — M6281 Muscle weakness (generalized): Secondary | ICD-10-CM | POA: Diagnosis not present

## 2017-03-21 DIAGNOSIS — J479 Bronchiectasis, uncomplicated: Secondary | ICD-10-CM | POA: Diagnosis not present

## 2017-03-25 DIAGNOSIS — M6281 Muscle weakness (generalized): Secondary | ICD-10-CM | POA: Diagnosis not present

## 2017-03-25 DIAGNOSIS — R0602 Shortness of breath: Secondary | ICD-10-CM | POA: Diagnosis not present

## 2017-03-26 ENCOUNTER — Other Ambulatory Visit: Payer: Medicare Other | Admitting: *Deleted

## 2017-03-26 DIAGNOSIS — Z79899 Other long term (current) drug therapy: Secondary | ICD-10-CM

## 2017-03-26 LAB — BASIC METABOLIC PANEL
BUN / CREAT RATIO: 29 — AB (ref 12–28)
BUN: 34 mg/dL — AB (ref 8–27)
CO2: 27 mmol/L (ref 18–29)
CREATININE: 1.17 mg/dL — AB (ref 0.57–1.00)
Calcium: 9.7 mg/dL (ref 8.7–10.3)
Chloride: 101 mmol/L (ref 96–106)
GFR calc non Af Amer: 43 mL/min/{1.73_m2} — ABNORMAL LOW (ref >59)
GFR, EST AFRICAN AMERICAN: 50 mL/min/{1.73_m2} — AB (ref >59)
Glucose: 91 mg/dL (ref 65–99)
Potassium: 4.2 mmol/L (ref 3.5–5.2)
Sodium: 145 mmol/L — ABNORMAL HIGH (ref 134–144)

## 2017-03-28 ENCOUNTER — Telehealth (HOSPITAL_COMMUNITY): Payer: Self-pay | Admitting: *Deleted

## 2017-03-28 DIAGNOSIS — R0789 Other chest pain: Secondary | ICD-10-CM | POA: Diagnosis not present

## 2017-03-28 DIAGNOSIS — J479 Bronchiectasis, uncomplicated: Secondary | ICD-10-CM | POA: Diagnosis not present

## 2017-03-28 DIAGNOSIS — Z7982 Long term (current) use of aspirin: Secondary | ICD-10-CM | POA: Diagnosis not present

## 2017-03-28 DIAGNOSIS — I10 Essential (primary) hypertension: Secondary | ICD-10-CM | POA: Diagnosis not present

## 2017-03-28 DIAGNOSIS — M1990 Unspecified osteoarthritis, unspecified site: Secondary | ICD-10-CM | POA: Diagnosis not present

## 2017-03-28 DIAGNOSIS — E876 Hypokalemia: Secondary | ICD-10-CM | POA: Diagnosis not present

## 2017-03-28 DIAGNOSIS — Z86718 Personal history of other venous thrombosis and embolism: Secondary | ICD-10-CM | POA: Diagnosis not present

## 2017-03-28 DIAGNOSIS — M6281 Muscle weakness (generalized): Secondary | ICD-10-CM | POA: Diagnosis not present

## 2017-03-28 DIAGNOSIS — J45909 Unspecified asthma, uncomplicated: Secondary | ICD-10-CM | POA: Diagnosis not present

## 2017-03-28 DIAGNOSIS — E785 Hyperlipidemia, unspecified: Secondary | ICD-10-CM | POA: Diagnosis not present

## 2017-03-28 DIAGNOSIS — E039 Hypothyroidism, unspecified: Secondary | ICD-10-CM | POA: Diagnosis not present

## 2017-03-28 DIAGNOSIS — Z9181 History of falling: Secondary | ICD-10-CM | POA: Diagnosis not present

## 2017-03-28 NOTE — Telephone Encounter (Signed)
Patient's granddaughter given detailed instructions per DPR per Myocardial Perfusion Study Information Sheet for the test on 04/02/17 at 1000. Patient notified to arrive 15 minutes early and that it is imperative to arrive on time for appointment to keep from having the test rescheduled.  If you need to cancel or reschedule your appointment, please call the office within 24 hours of your appointment. . Patient verbalized understanding.Stephanie Clay, Stephanie Clay

## 2017-04-02 ENCOUNTER — Other Ambulatory Visit (HOSPITAL_COMMUNITY): Payer: Medicare Other

## 2017-04-02 ENCOUNTER — Encounter (HOSPITAL_COMMUNITY): Payer: Medicare Other

## 2017-04-09 ENCOUNTER — Ambulatory Visit: Payer: Medicare Other | Admitting: Physician Assistant

## 2017-04-21 DIAGNOSIS — R110 Nausea: Secondary | ICD-10-CM | POA: Diagnosis not present

## 2017-04-21 DIAGNOSIS — N3000 Acute cystitis without hematuria: Secondary | ICD-10-CM | POA: Diagnosis not present

## 2017-04-21 DIAGNOSIS — N3090 Cystitis, unspecified without hematuria: Secondary | ICD-10-CM | POA: Diagnosis not present

## 2017-04-23 DIAGNOSIS — R10817 Generalized abdominal tenderness: Secondary | ICD-10-CM | POA: Diagnosis not present

## 2017-04-23 DIAGNOSIS — R110 Nausea: Secondary | ICD-10-CM | POA: Diagnosis not present

## 2017-04-23 DIAGNOSIS — R1084 Generalized abdominal pain: Secondary | ICD-10-CM | POA: Diagnosis not present

## 2017-04-23 DIAGNOSIS — N3000 Acute cystitis without hematuria: Secondary | ICD-10-CM | POA: Diagnosis not present

## 2017-04-25 DIAGNOSIS — I700 Atherosclerosis of aorta: Secondary | ICD-10-CM | POA: Diagnosis not present

## 2017-04-25 DIAGNOSIS — R1084 Generalized abdominal pain: Secondary | ICD-10-CM | POA: Diagnosis not present

## 2017-04-25 DIAGNOSIS — R109 Unspecified abdominal pain: Secondary | ICD-10-CM | POA: Diagnosis not present

## 2017-04-25 DIAGNOSIS — J479 Bronchiectasis, uncomplicated: Secondary | ICD-10-CM | POA: Diagnosis not present

## 2017-04-25 DIAGNOSIS — R110 Nausea: Secondary | ICD-10-CM | POA: Diagnosis not present

## 2017-04-29 DIAGNOSIS — R944 Abnormal results of kidney function studies: Secondary | ICD-10-CM | POA: Diagnosis not present

## 2017-05-02 ENCOUNTER — Ambulatory Visit: Payer: Medicare Other | Admitting: Cardiovascular Disease

## 2017-05-06 DIAGNOSIS — E785 Hyperlipidemia, unspecified: Secondary | ICD-10-CM | POA: Diagnosis not present

## 2017-05-06 DIAGNOSIS — R600 Localized edema: Secondary | ICD-10-CM | POA: Diagnosis not present

## 2017-05-06 DIAGNOSIS — E875 Hyperkalemia: Secondary | ICD-10-CM | POA: Diagnosis not present

## 2017-05-22 DIAGNOSIS — R600 Localized edema: Secondary | ICD-10-CM | POA: Diagnosis not present

## 2017-05-22 DIAGNOSIS — M79662 Pain in left lower leg: Secondary | ICD-10-CM | POA: Diagnosis not present

## 2017-05-22 DIAGNOSIS — M7989 Other specified soft tissue disorders: Secondary | ICD-10-CM | POA: Diagnosis not present

## 2017-05-22 DIAGNOSIS — M7122 Synovial cyst of popliteal space [Baker], left knee: Secondary | ICD-10-CM | POA: Diagnosis not present

## 2017-05-22 DIAGNOSIS — Z86718 Personal history of other venous thrombosis and embolism: Secondary | ICD-10-CM | POA: Diagnosis not present

## 2017-05-22 DIAGNOSIS — M79661 Pain in right lower leg: Secondary | ICD-10-CM | POA: Diagnosis not present

## 2017-05-22 DIAGNOSIS — R609 Edema, unspecified: Secondary | ICD-10-CM | POA: Diagnosis not present

## 2017-06-03 DIAGNOSIS — M7122 Synovial cyst of popliteal space [Baker], left knee: Secondary | ICD-10-CM | POA: Diagnosis not present

## 2017-06-03 DIAGNOSIS — M1712 Unilateral primary osteoarthritis, left knee: Secondary | ICD-10-CM | POA: Diagnosis not present

## 2017-06-06 DIAGNOSIS — R1030 Lower abdominal pain, unspecified: Secondary | ICD-10-CM | POA: Diagnosis not present

## 2017-06-06 DIAGNOSIS — K5900 Constipation, unspecified: Secondary | ICD-10-CM | POA: Diagnosis not present

## 2017-06-09 NOTE — Progress Notes (Signed)
Cardiology Office Note    Date:  06/11/2017   ID:  Stephanie Clay, DOB 06-12-1932, MRN [id number]  PCP:  Blane Ohara, MD  Cardiologist:  Dr. Eden Emms  CC:    History of Present Illness:  Stephanie Clay is a 83 y.o. female with a history of HTN, HLD, CKD, asthma with bronchiectasis, DVT on Eliquis and hypothyroidism Seen by PA  03/19/17 for weight gain and edema  Patient has a history of bronchiectasis and possible mycobacterial disease based on micronodular disease on CT scan of the chest. Cultures have never grown out Mycobacterium. She is followed by Dr. Delton Coombes.  She has been seen by me in the past for "CHF" and chest pain.  LE edema was from chronic lung disease and venous insufficiency. Chest pain felt to be atypical. Echo 12/07/14 showed Septum 9 mm, LA 3.2 cm  EF 63%  Normal diastolic filling pattern for age, mild AR, estimated PA pressure 50 mmHg.  She was diagnosed with a LLE DVT in 10/2016 and placed on Eliquis. Diuetics adjusted with improvement  Echo and myovue ordered but never done   She has failure to thrive with poor appetite   Past Medical History:  Diagnosis Date  . Asthma   . Bronchiectasis without acute exacerbation (HCC) 01/03/2015   CT chest 12/2014:  Bronchiectasis throughout right lung, left clear.  +tree and bud, + patchy gg infiltrates in RLL Sputum 2016: no growth, AFB smear neg PFT's 02/2015:  Normal except mild decrease in DLCO   . Chronic chest pain   . Gastro-esophageal reflux   . Hyperlipemia   . Iron (Fe) deficiency anemia   . Pneumathemia (HCC)   . Scoliosis   . Secondary hyperparathyroidism (HCC)   . Varicose veins    Bilateral leg    Past Surgical History:  Procedure Laterality Date  . ABDOMINAL HYSTERECTOMY    . CYSTOCELE REPAIR    . SALPINGOOPHORECTOMY      Current Medications: Outpatient Medications Prior to Visit  Medication Sig Dispense Refill  . albuterol (PROVENTIL HFA;VENTOLIN HFA) 108 (90 Base) MCG/ACT inhaler Inhale 2 puffs into  the lungs every 6 (six) hours as needed for wheezing or shortness of breath.    . ALPRAZolam (XANAX) 0.5 MG tablet Take 0.5 mg by mouth 2 (two) times daily as needed for anxiety.    Marland Kitchen apixaban (ELIQUIS) 5 MG TABS tablet Take 5 mg by mouth 2 (two) times daily.    Marland Kitchen atenolol-chlorthalidone (TENORETIC) 100-25 MG tablet Take 1 tablet by mouth daily.    . cyanocobalamin 500 MCG tablet Take 500 mcg by mouth daily.    Marland Kitchen dicyclomine (BENTYL) 10 MG capsule Take 10 mg by mouth 4 (four) times daily -  before meals and at bedtime.    . fluticasone (FLONASE) 50 MCG/ACT nasal spray Place 2 sprays into both nostrils as needed.     . furosemide (LASIX) 20 MG tablet Take 2 tablets by mouth twice a day for 2 days then go down to 1 tablet by mouth twice a day 90 tablet 3  . gabapentin (NEURONTIN) 100 MG capsule Take 100 mg by mouth 3 (three) times daily.  2  . HYDROcodone-acetaminophen (NORCO/VICODIN) 5-325 MG tablet Take 1 tablet by mouth every 6 (six) hours as needed for moderate pain.    . hydrOXYzine (ATARAX/VISTARIL) 25 MG tablet Take 25 mg by mouth 3 (three) times daily as needed.    Marland Kitchen levothyroxine (SYNTHROID, LEVOTHROID) 50 MCG tablet Take 50 mcg by  mouth daily before breakfast.    . meclizine (ANTIVERT) 12.5 MG tablet Take 12.5 mg by mouth every 8 (eight) hours as needed for dizziness.  1  . nitroGLYCERIN (NITROSTAT) 0.4 MG SL tablet Place 0.4 mg under the tongue every 5 (five) minutes as needed for chest pain. X 3 doses    . ondansetron (ZOFRAN) 4 MG tablet Take 4 mg by mouth every 8 (eight) hours as needed for nausea or vomiting.    . pantoprazole (PROTONIX) 40 MG tablet Take 40 mg by mouth daily.    . potassium chloride SA (K-DUR,KLOR-CON) 20 MEQ tablet Take 40 mEq by mouth 3 (three) times daily.    . simvastatin (ZOCOR) 40 MG tablet Take 40 mg by mouth daily.     Marland Kitchen spironolactone (ALDACTONE) 25 MG tablet Take 25 mg by mouth daily.    Marland Kitchen triamcinolone cream (KENALOG) 0.1 % Apply 1 application topically 2  (two) times daily as needed.      No facility-administered medications prior to visit.      Allergies:   Ciprofloxacin; Levofloxacin; and Nitrofurantoin   Social History   Social History  . Marital status: Married    Spouse name: carl  . Number of children: 3  . Years of education: 9   Occupational History  . retired    Social History Main Topics  . Smoking status: Never Smoker  . Smokeless tobacco: Never Used  . Alcohol use No  . Drug use: No  . Sexual activity: Not Asked   Other Topics Concern  . None   Social History Narrative  . None     Family History:  The patient's family history includes Cancer in her sister; Emphysema in her father; Heart disease in her mother and sister; Hyperlipidemia in her daughter; Hypertension in her daughter and sister; Varicose Veins in her sister.      ROS:   Please see the history of present illness.    ROS All other systems reviewed and are negative.   PHYSICAL EXAM:   VS:  BP 140/62   Pulse (!) 58   Ht 4\' 11"  (1.499 m)   Wt 81 lb 12.8 oz (37.1 kg)   SpO2 97%   BMI 16.52 kg/m    GEN: frail thin white female  HEENT: normal  Neck: +  JVD to jawline,no carotid bruits, or masses Cardiac: RRR; no murmurs, rubs, or gallops, 1-2+ bilateral LE edema  Respiratory:  Rhonchi and exp wheezing  GI: soft, nontender, nondistended, + BS MS: no deformity or atrophy  Skin: warm and dry, no rash Neuro:  Alert and Oriented x 3, Strength and sensation are intact Psych: euthymic mood, full affect    Wt Readings from Last 3 Encounters:  06/11/17 81 lb 12.8 oz (37.1 kg)  03/19/17 90 lb 1.9 oz (40.9 kg)  01/16/17 90 lb 6.4 oz (41 kg)      Studies/Labs Reviewed:   EKG:  May 2018  NSR HR 60 with PACs with baseline artifact  Recent Labs:  07/02/2016: Magnesium 2.3 07/07/2016: B Natriuretic Peptide 183.3; Hemoglobin 10.9; Platelets 214 03/19/2017: ALT 10; NT-Pro BNP 602 03/26/2017: BUN 34; Creatinine, Ser 1.17; Potassium 4.2; Sodium 145     Lipid Panel No results found for: CHOL, TRIG, HDL, CHOLHDL, VLDL, LDLCALC, LDLDIRECT  Additional studies/ records that were reviewed today include:   Echo 12/07/14: Septum 9 mm  LA 3.2 cm  EF 63%  Normal diastolic filling pattern for age mild AR estimated PA pressure 50  mmHg   ASSESSMENT & PLAN:   LE edema/weight gain:  Resolved doubt cardiac etiology she does not want f/u echo   DOE: BNP with normal in May CAD seen on CT for MAI  Echo and myovue ordered But never done she is improved and does not want more testing   HTN: Well controlled.  Continue current medications and low sodium Dash type diet.    Bronchiectasis: followed by pulmonary. Needs flu shot and pneumonia sot   DVT: eliquis stopped by primary now on ASA   HLD: continue statin    Charlton Haws, MD

## 2017-06-11 ENCOUNTER — Encounter: Payer: Self-pay | Admitting: Cardiovascular Disease

## 2017-06-11 ENCOUNTER — Ambulatory Visit (INDEPENDENT_AMBULATORY_CARE_PROVIDER_SITE_OTHER): Payer: Medicare Other | Admitting: Cardiovascular Disease

## 2017-06-11 VITALS — BP 140/62 | HR 58 | Ht 59.0 in | Wt 81.8 lb

## 2017-06-11 DIAGNOSIS — R609 Edema, unspecified: Secondary | ICD-10-CM | POA: Diagnosis not present

## 2017-06-11 DIAGNOSIS — R197 Diarrhea, unspecified: Secondary | ICD-10-CM | POA: Diagnosis not present

## 2017-06-11 DIAGNOSIS — K2900 Acute gastritis without bleeding: Secondary | ICD-10-CM | POA: Diagnosis not present

## 2017-06-11 NOTE — Patient Instructions (Addendum)

## 2017-06-17 DIAGNOSIS — E876 Hypokalemia: Secondary | ICD-10-CM | POA: Diagnosis not present

## 2017-06-17 DIAGNOSIS — J479 Bronchiectasis, uncomplicated: Secondary | ICD-10-CM | POA: Diagnosis not present

## 2017-06-17 DIAGNOSIS — I10 Essential (primary) hypertension: Secondary | ICD-10-CM | POA: Diagnosis not present

## 2017-06-17 DIAGNOSIS — D649 Anemia, unspecified: Secondary | ICD-10-CM | POA: Diagnosis not present

## 2017-06-17 DIAGNOSIS — E211 Secondary hyperparathyroidism, not elsewhere classified: Secondary | ICD-10-CM | POA: Diagnosis not present

## 2017-06-17 DIAGNOSIS — R309 Painful micturition, unspecified: Secondary | ICD-10-CM | POA: Diagnosis not present

## 2017-06-17 DIAGNOSIS — N289 Disorder of kidney and ureter, unspecified: Secondary | ICD-10-CM | POA: Diagnosis not present

## 2017-06-17 DIAGNOSIS — I519 Heart disease, unspecified: Secondary | ICD-10-CM | POA: Diagnosis not present

## 2017-06-17 DIAGNOSIS — N189 Chronic kidney disease, unspecified: Secondary | ICD-10-CM | POA: Diagnosis not present

## 2017-06-17 DIAGNOSIS — R809 Proteinuria, unspecified: Secondary | ICD-10-CM | POA: Diagnosis not present

## 2017-06-17 DIAGNOSIS — E559 Vitamin D deficiency, unspecified: Secondary | ICD-10-CM | POA: Diagnosis not present

## 2017-06-17 DIAGNOSIS — E039 Hypothyroidism, unspecified: Secondary | ICD-10-CM | POA: Diagnosis not present

## 2017-06-26 DIAGNOSIS — Z23 Encounter for immunization: Secondary | ICD-10-CM | POA: Diagnosis not present

## 2017-06-26 DIAGNOSIS — K5289 Other specified noninfective gastroenteritis and colitis: Secondary | ICD-10-CM | POA: Diagnosis not present

## 2017-07-04 ENCOUNTER — Encounter: Payer: Self-pay | Admitting: Emergency Medicine

## 2017-07-04 ENCOUNTER — Ambulatory Visit (INDEPENDENT_AMBULATORY_CARE_PROVIDER_SITE_OTHER): Payer: Medicare Other | Admitting: Emergency Medicine

## 2017-07-04 VITALS — BP 122/62 | HR 56 | Ht 59.0 in | Wt 83.0 lb

## 2017-07-04 DIAGNOSIS — J471 Bronchiectasis with (acute) exacerbation: Secondary | ICD-10-CM | POA: Diagnosis not present

## 2017-07-04 NOTE — Progress Notes (Signed)
Subjective:    Patient ID: Stephanie Clay, female    DOB: July 15, 1932, 83 y.o.   MRN: [id number]  HPI 83 year old woman, former tobacco, has been followed by Dr. Shelle Ironlance for bronchiectasis and abnormal CT chest. There is been some question of dysphagia and possible aspiration.  She has been dealing with fatigue, had also had cough that has improved significantly. Her functional capacity is better.   She underwent pulmonary function testing in May 2016 that I personally reviewed. This showed grossly normal airflows without a bronchodilator response, normal lung volumes and a decreased DLCO that corrects to normal range for alveolar volume.. [facility] a [facility]15 that showed termination of primary peristalsis in the mid thoracic esophagus, contrast stasis without any apparent mass or stricture. She did have a small sliding hiatal hernia and mild esophageal reflux. Finally she had sputum cultures and AFB performed in May that showed normal flora and were negative for AFB. She underwent endoscopy that confirmed no obstructing lesions, [place].   Personal reviewed [facility] CT scan from [facility].               ROV 01/19/16 --   Follow-up visit for history of bronchiectasis and a CT scan with a micronodular pattern that is most suggestive of mycobacterial disease. Her sputum samples have been negative for mycobacterial disease. At our last visit in September she was not having any active symptoms and we made the decision to defer bronchoscopy and to repeat her CT scan of the chest. This was done on 01/12/16 and I have reviewed the films. There has been interval improvement in her bronchiectasis and micronodular changes compared [facility].   She does have some cough, rhinitis. She is on flonase,   ROV 08/20/16 -- Patient has a history of bronchiectasis and possible mycobacterial disease based on micronodular disease on CT scan of the  chest. Cultures have never grown out Mycobacterium.. She had a flare one month ago in setting URI and was seen in our office. She was treated with abx just prior to that visit. She is on flonase uses prn, has albuterol that she uses rarely. Coughs daily, scant sputum.   ROV 11/16/16 -- visit for patient with a history of cough, bronchiectasis, probable mycobacterial disease based on appearance of her CT scan of the chest (always culture negative). She tells me that she had a URI beginning of December, was treated for an AE-bronchiectasis, finished abx last week. Even during the illness she was not coughing up any sputum. She was recently dx with a LLE DVT, now on Eliquis. She uses flonase prn. Has albuterol to use prn, uses rarely but did use it during her recent flare. She believes that she had pneumonia vaccine   ROV 01/16/17 -- [name] is a never smoker has a history of bronchiectasis, suspected mycobacterial disease on CT scan of the chest that has always been culture negative. Also with a history of allergic rhinitis and a lower extremity DVT on anticoagulation. Her last CT scan of the chest was on 01/12/16. Reports today reporting that she is having some occasional weakness, fatigue especially w exertion. Can be episodic. Not associated w dyspnea or cough. No fevers at those times. Her breathing is doing well. She has minimal cough. She had albuterol available fo rprn. She has sneezing, runny nose. She has flonase for prn.   ROV 07/04/17 -- this follow-up visit for patient  with a history of bronchiectasis, suspected mycobacterial disease by CT scan of the chest. Her cultures have always been negative. Also with a history of allergic rhinitis, lower ext DVT on anticoagulation. She reports today that her breathing is limiting - she has exertional dyspnea with her household chores. She has noticed dysphagia,  with meds, with food. Rare aspiration sx. She coughs rarely. Minimal mucous. She remains on flonase  qd, pantoprazole.   Review of Systems As per HPI     Objective:   Physical Exam Vitals:   07/04/17 1027  BP: 122/62  Pulse: (!) 56  SpO2: 100%  Weight: 83 lb (37.6 kg)  Height:  (1.499 m)   Gen: pleasant thin elderly woman, in no distress,  normal affect  ENT: No lesions,  mouth clear,  oropharynx clear, no postnasal drip  Neck: No JVD, no TMG, no carotid bruits  Lungs: No use of accessory muscles, few B insp squeaks.   Cardiovascular: RRR, heart sounds normal, no murmur or gallops, no peripheral edema  Musculoskeletal: No deformities, no cyanosis or clubbing  Neuro: alert, non focal  Skin: Warm, no lesions or rashes      Assessment & Plan:  Bronchiectasis with evidence of chronic atypical infection Please continue your medications as you have been taking them  We will plan to repeat your Ct scan of the chest without contrast in March 2019 to evaluate bronchiectasis Follow with Dr Delton Coombes in March after your CT scan to review  Levy Pupa, MD, PhD 07/04/2017, 10:59 AM [facility] [phone] or if no answer [phone]

## 2017-07-04 NOTE — Assessment & Plan Note (Signed)
Please continue your medications as you have been taking them  We will plan to repeat your Ct scan of the chest without contrast in March 2019 to evaluate bronchiectasis Follow with Dr Delton CoombesByrum in March after your CT scan to review

## 2017-07-04 NOTE — Patient Instructions (Addendum)
Please continue your medications as you have been taking them  We will plan to repeat your Ct scan of the chest without contrast in March 2019 to evaluate bronchiectasis Follow with Dr Samyia Motter in March after your CT scan to review 

## 2017-08-05 IMAGING — CT CT CHEST W/O CM
2 of 3 series · 15 of 36 positions shown, 18 images · non-contrast
Comparison: None.

CLINICAL DATA: Bronchiectasis.  Former smoker.  Fatigue.

EXAM:
CT CHEST WITHOUT CONTRAST
TECHNIQUE: Multidetector CT imaging of the chest was performed following the
standard protocol without IV contrast.

[Series 2: thorax · axial · 0.61mm/px · z∈[-239,-9]mm · 12 of 54 slices shown, 15 images]
[im 4/54  mediastinal]
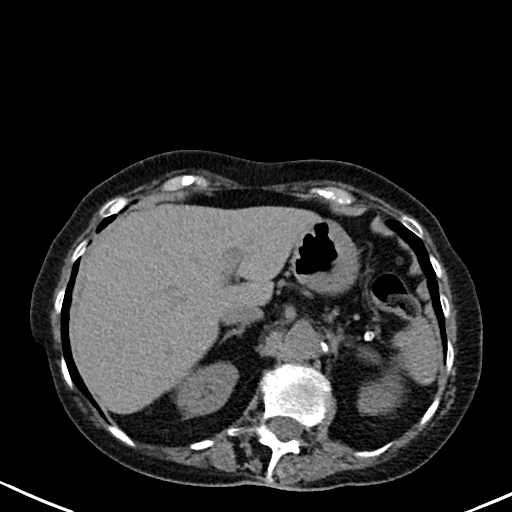
[im 4/54  lung]
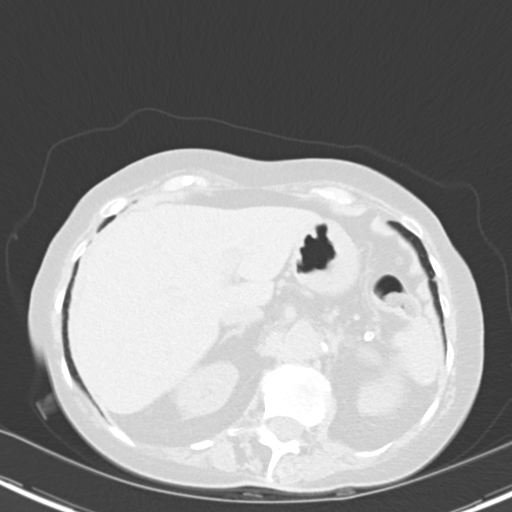
[im 8/54  lung]
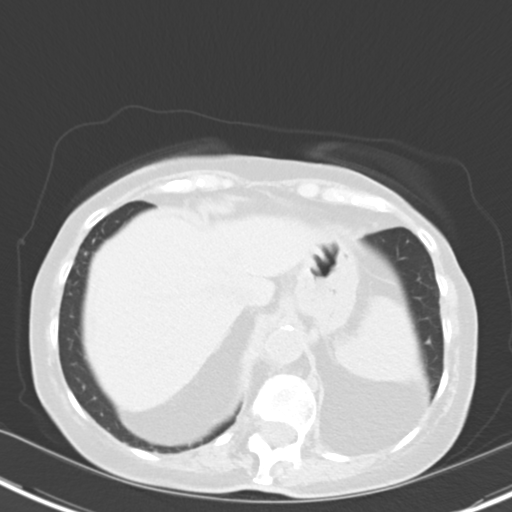
[im 12/54  lung]
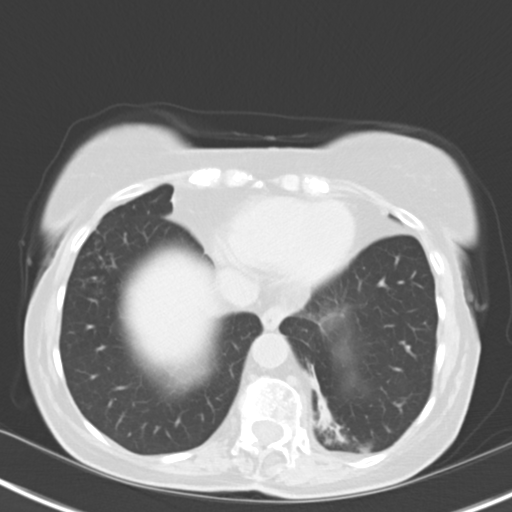
[im 16/54  lung]
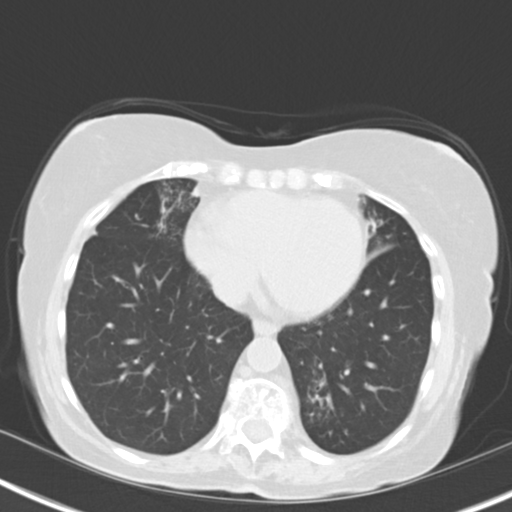
[im 20/54  mediastinal]
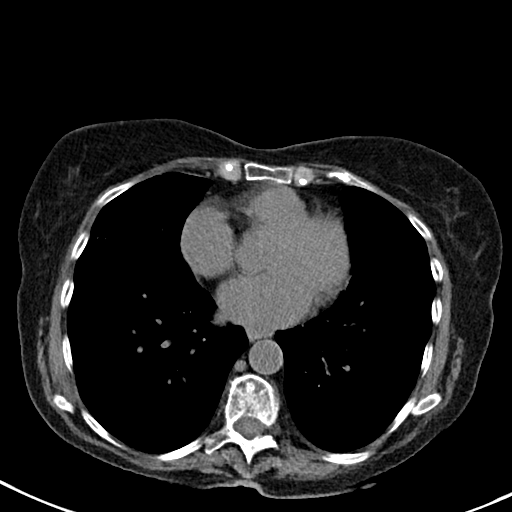
[im 20/54  lung]
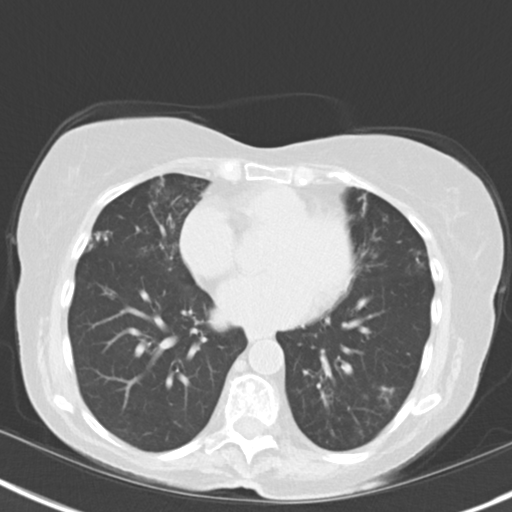
[im 24/54  lung]
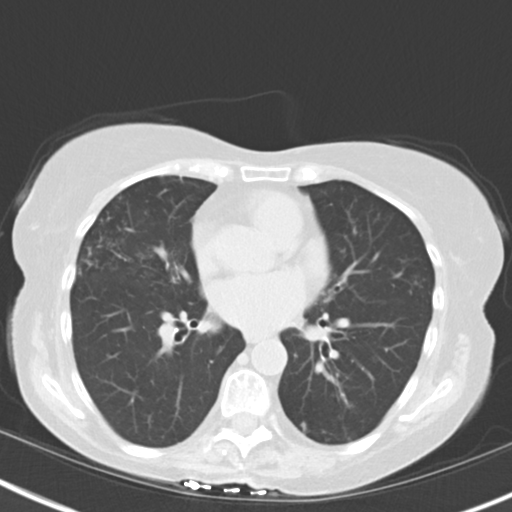
[im 30/54  lung]
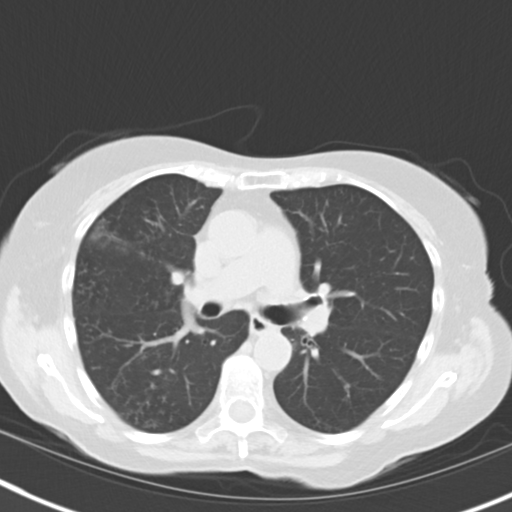
[im 34/54  lung]
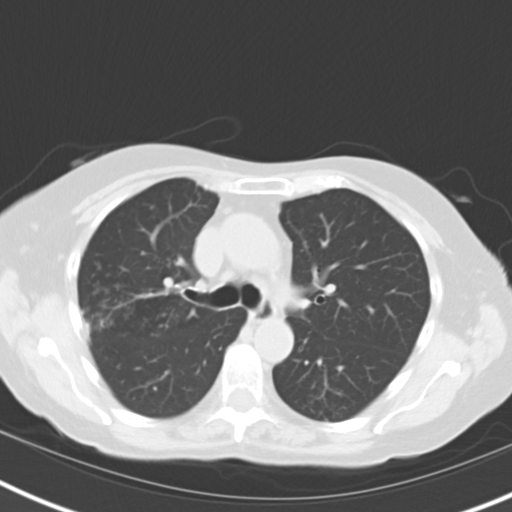
[im 38/54  mediastinal]
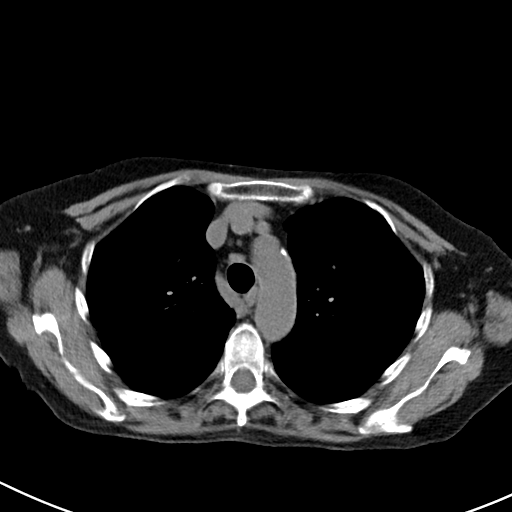
[im 38/54  lung]
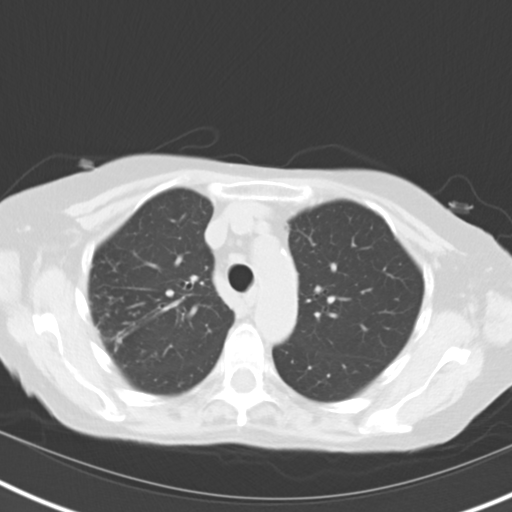
[im 42/54  lung]
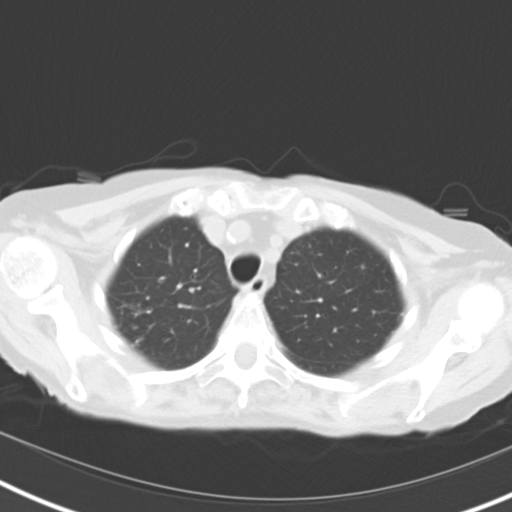
[im 46/54  lung]
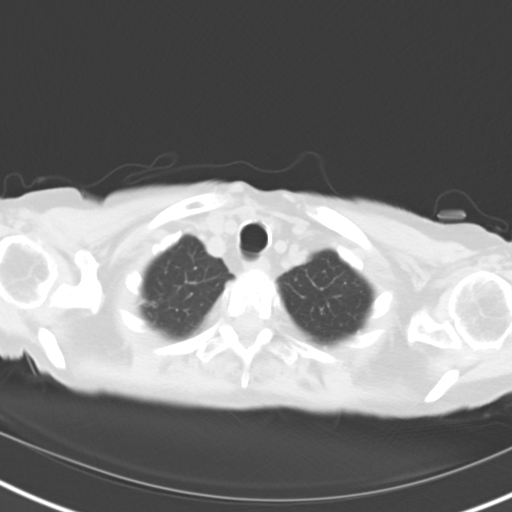
[im 50/54  lung]
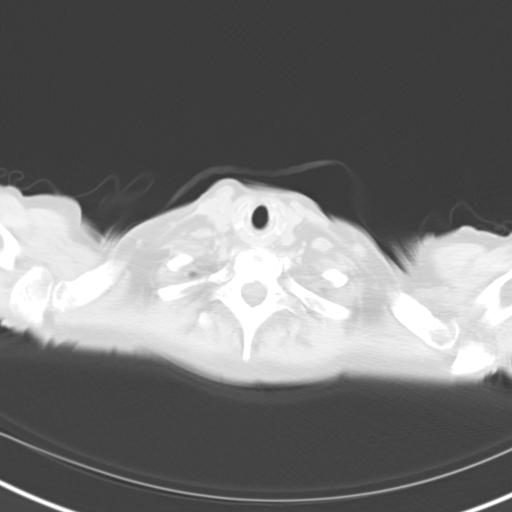

[Series 5: coronal · coronal · 0.52mm/px · 3 of 105 slices shown]
[im 21/105  lung]
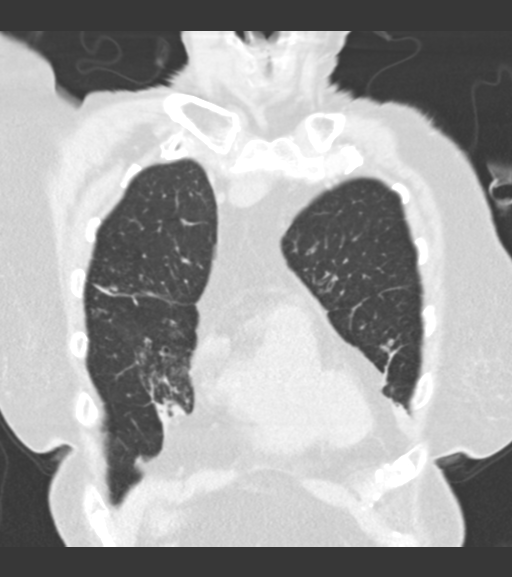
[im 42/105  lung]
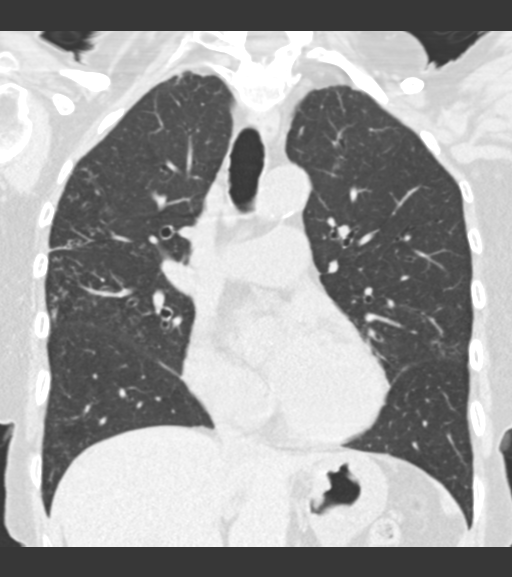
[im 63/105  lung]
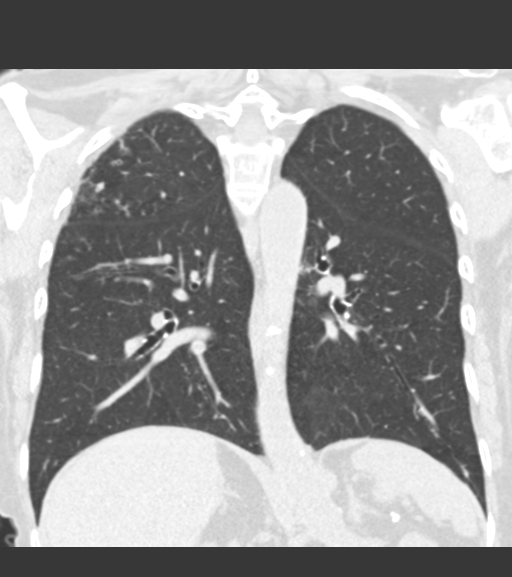

[15 of 36 positions shown; findings below may reference images not displayed]

FINDINGS: Mediastinum/Nodes: Normal heart size. No pericardial
fluid/thickening. Left anterior descending coronary atherosclerosis.
Mildly atherosclerotic nonaneurysmal thoracic aorta. Normal caliber
pulmonary arteries. Atrophic appearing thyroid. Normal esophagus. No
pathologically enlarged axillary, mediastinal or gross hilar lymph
nodes, noting limited sensitivity for the detection of hilar
adenopathy on this noncontrast study.

Lungs/Pleura: No pneumothorax. No pleural effusion. Nonspecific 6 mm
focal density in the non dependent left tracheal wall (series 5/
image 41), likely retained secretions. There is mild-to-moderate
patchy tree-in-bud opacity and centrilobular nodularity throughout
the peripheral and basilar right upper lobe, right middle lobe,
lingula and both lower lobes. There is associated mild-to-moderate
cylindrical and varicoid bronchiectasis at the areas of tree-in-bud
opacity, most prominent in the right middle lobe, lingula, medial
basilar left lower lobe and basilar right upper lobe. There are
associated patchy subpleural regions of bandlike scarring and
distortion, most prominent in the medial right middle lobe, medial
lingula and medial basilar left lower lobe. No acute consolidative
airspace disease or lung masses.

Upper abdomen: Unremarkable.

Musculoskeletal: No aggressive appearing focal osseous lesions. Mild
degenerative changes in the thoracic spine. Mild compression
deformity of the superior L1 vertebral body, which appears chronic.
IMPRESSION: 1. Mild to moderate chronic infectious bronchiolitis with
cylindrical and varicoid bronchiectasis and scarring, with patchy
involvement of both lungs as described, characteristic of atypical
mycobacterial infection ([facility]).
2. One vessel coronary atherosclerosis.
3. Mild superior L1 vertebral compression fracture, which appears
chronic.

## 2017-08-27 DIAGNOSIS — N3000 Acute cystitis without hematuria: Secondary | ICD-10-CM | POA: Diagnosis not present

## 2017-08-27 DIAGNOSIS — D649 Anemia, unspecified: Secondary | ICD-10-CM | POA: Diagnosis not present

## 2017-08-27 DIAGNOSIS — N183 Chronic kidney disease, stage 3 (moderate): Secondary | ICD-10-CM | POA: Diagnosis not present

## 2017-08-27 DIAGNOSIS — I728 Aneurysm of other specified arteries: Secondary | ICD-10-CM | POA: Diagnosis not present

## 2017-08-27 DIAGNOSIS — J158 Pneumonia due to other specified bacteria: Secondary | ICD-10-CM | POA: Diagnosis not present

## 2017-08-27 DIAGNOSIS — M79662 Pain in left lower leg: Secondary | ICD-10-CM | POA: Diagnosis not present

## 2017-09-19 ENCOUNTER — Encounter: Payer: Self-pay | Admitting: *Deleted

## 2017-09-19 NOTE — Telephone Encounter (Signed)
This encounter was created in error - please disregard.

## 2017-09-23 DIAGNOSIS — E559 Vitamin D deficiency, unspecified: Secondary | ICD-10-CM | POA: Diagnosis not present

## 2017-09-23 DIAGNOSIS — R309 Painful micturition, unspecified: Secondary | ICD-10-CM | POA: Diagnosis not present

## 2017-09-23 DIAGNOSIS — E039 Hypothyroidism, unspecified: Secondary | ICD-10-CM | POA: Diagnosis not present

## 2017-09-23 DIAGNOSIS — N289 Disorder of kidney and ureter, unspecified: Secondary | ICD-10-CM | POA: Diagnosis not present

## 2017-09-23 DIAGNOSIS — N250 Renal osteodystrophy: Secondary | ICD-10-CM | POA: Diagnosis not present

## 2017-09-23 DIAGNOSIS — N189 Chronic kidney disease, unspecified: Secondary | ICD-10-CM | POA: Diagnosis not present

## 2017-09-23 DIAGNOSIS — D649 Anemia, unspecified: Secondary | ICD-10-CM | POA: Diagnosis not present

## 2017-09-23 DIAGNOSIS — E876 Hypokalemia: Secondary | ICD-10-CM | POA: Diagnosis not present

## 2017-09-23 DIAGNOSIS — I10 Essential (primary) hypertension: Secondary | ICD-10-CM | POA: Diagnosis not present

## 2017-09-23 DIAGNOSIS — J479 Bronchiectasis, uncomplicated: Secondary | ICD-10-CM | POA: Diagnosis not present

## 2017-09-26 DIAGNOSIS — J208 Acute bronchitis due to other specified organisms: Secondary | ICD-10-CM | POA: Diagnosis not present

## 2017-09-26 DIAGNOSIS — E440 Moderate protein-calorie malnutrition: Secondary | ICD-10-CM | POA: Diagnosis not present

## 2017-09-26 DIAGNOSIS — N958 Other specified menopausal and perimenopausal disorders: Secondary | ICD-10-CM | POA: Diagnosis not present

## 2017-09-26 DIAGNOSIS — Z0001 Encounter for general adult medical examination with abnormal findings: Secondary | ICD-10-CM | POA: Diagnosis not present

## 2017-10-10 DIAGNOSIS — N3000 Acute cystitis without hematuria: Secondary | ICD-10-CM | POA: Diagnosis not present

## 2017-10-10 DIAGNOSIS — N390 Urinary tract infection, site not specified: Secondary | ICD-10-CM | POA: Diagnosis not present

## 2017-11-06 DIAGNOSIS — N958 Other specified menopausal and perimenopausal disorders: Secondary | ICD-10-CM | POA: Diagnosis not present

## 2017-11-06 DIAGNOSIS — M810 Age-related osteoporosis without current pathological fracture: Secondary | ICD-10-CM | POA: Diagnosis not present

## 2017-11-11 DIAGNOSIS — M79661 Pain in right lower leg: Secondary | ICD-10-CM | POA: Diagnosis not present

## 2017-11-11 DIAGNOSIS — D649 Anemia, unspecified: Secondary | ICD-10-CM | POA: Diagnosis not present

## 2017-11-26 DIAGNOSIS — M79604 Pain in right leg: Secondary | ICD-10-CM | POA: Diagnosis not present

## 2017-11-26 DIAGNOSIS — M79605 Pain in left leg: Secondary | ICD-10-CM | POA: Diagnosis not present

## 2017-11-26 DIAGNOSIS — I87393 Chronic venous hypertension (idiopathic) with other complications of bilateral lower extremity: Secondary | ICD-10-CM | POA: Diagnosis not present

## 2017-12-12 DIAGNOSIS — I728 Aneurysm of other specified arteries: Secondary | ICD-10-CM | POA: Diagnosis not present

## 2017-12-12 DIAGNOSIS — J00 Acute nasopharyngitis [common cold]: Secondary | ICD-10-CM | POA: Diagnosis not present

## 2017-12-16 DIAGNOSIS — N189 Chronic kidney disease, unspecified: Secondary | ICD-10-CM | POA: Diagnosis not present

## 2017-12-25 DIAGNOSIS — E782 Mixed hyperlipidemia: Secondary | ICD-10-CM | POA: Diagnosis not present

## 2017-12-25 DIAGNOSIS — E559 Vitamin D deficiency, unspecified: Secondary | ICD-10-CM | POA: Diagnosis not present

## 2017-12-25 DIAGNOSIS — E038 Other specified hypothyroidism: Secondary | ICD-10-CM | POA: Diagnosis not present

## 2017-12-25 DIAGNOSIS — I10 Essential (primary) hypertension: Secondary | ICD-10-CM | POA: Diagnosis not present

## 2017-12-25 DIAGNOSIS — N3000 Acute cystitis without hematuria: Secondary | ICD-10-CM | POA: Diagnosis not present

## 2018-01-01 ENCOUNTER — Ambulatory Visit (INDEPENDENT_AMBULATORY_CARE_PROVIDER_SITE_OTHER)
Admission: RE | Admit: 2018-01-01 | Discharge: 2018-01-01 | Disposition: A | Discharge status: Home / Self Care | Payer: Medicare Other | Source: Ambulatory Visit | Attending: Emergency Medicine | Admitting: Emergency Medicine

## 2018-01-01 DIAGNOSIS — J479 Bronchiectasis, uncomplicated: Secondary | ICD-10-CM | POA: Diagnosis not present

## 2018-01-01 DIAGNOSIS — J471 Bronchiectasis with (acute) exacerbation: Secondary | ICD-10-CM | POA: Diagnosis not present

## 2018-01-06 DIAGNOSIS — R5381 Other malaise: Secondary | ICD-10-CM | POA: Diagnosis not present

## 2018-01-06 DIAGNOSIS — R110 Nausea: Secondary | ICD-10-CM | POA: Diagnosis not present

## 2018-01-06 DIAGNOSIS — R10816 Epigastric abdominal tenderness: Secondary | ICD-10-CM | POA: Diagnosis not present

## 2018-01-06 DIAGNOSIS — R51 Headache: Secondary | ICD-10-CM | POA: Diagnosis not present

## 2018-01-15 DIAGNOSIS — I10 Essential (primary) hypertension: Secondary | ICD-10-CM | POA: Diagnosis not present

## 2018-01-15 DIAGNOSIS — J189 Pneumonia, unspecified organism: Secondary | ICD-10-CM | POA: Diagnosis not present

## 2018-01-15 DIAGNOSIS — D649 Anemia, unspecified: Secondary | ICD-10-CM | POA: Diagnosis not present

## 2018-01-15 DIAGNOSIS — I6789 Other cerebrovascular disease: Secondary | ICD-10-CM | POA: Diagnosis not present

## 2018-01-15 DIAGNOSIS — R918 Other nonspecific abnormal finding of lung field: Secondary | ICD-10-CM | POA: Diagnosis not present

## 2018-01-15 DIAGNOSIS — R55 Syncope and collapse: Secondary | ICD-10-CM | POA: Diagnosis not present

## 2018-01-15 DIAGNOSIS — R4781 Slurred speech: Secondary | ICD-10-CM | POA: Diagnosis not present

## 2018-01-15 DIAGNOSIS — S3993XA Unspecified injury of pelvis, initial encounter: Secondary | ICD-10-CM | POA: Diagnosis not present

## 2018-01-27 ENCOUNTER — Encounter: Payer: Self-pay | Admitting: Emergency Medicine

## 2018-01-27 ENCOUNTER — Telehealth: Payer: Self-pay | Admitting: Gastroenterology

## 2018-01-27 ENCOUNTER — Encounter: Payer: Self-pay | Admitting: Gastroenterology

## 2018-01-27 ENCOUNTER — Ambulatory Visit: Payer: Medicare Other | Admitting: Emergency Medicine

## 2018-01-27 VITALS — BP 110/78 | HR 60 | Ht 59.0 in | Wt 78.0 lb

## 2018-01-27 DIAGNOSIS — R109 Unspecified abdominal pain: Secondary | ICD-10-CM | POA: Diagnosis not present

## 2018-01-27 DIAGNOSIS — R634 Abnormal weight loss: Secondary | ICD-10-CM

## 2018-01-27 DIAGNOSIS — R110 Nausea: Secondary | ICD-10-CM | POA: Diagnosis not present

## 2018-01-27 DIAGNOSIS — J471 Bronchiectasis with (acute) exacerbation: Secondary | ICD-10-CM | POA: Diagnosis not present

## 2018-01-27 DIAGNOSIS — R1084 Generalized abdominal pain: Secondary | ICD-10-CM

## 2018-01-27 HISTORY — DX: Unspecified abdominal pain: R10.9

## 2018-01-27 HISTORY — DX: Generalized abdominal pain: R10.84

## 2018-01-27 NOTE — Patient Instructions (Addendum)
We will refer you to see gastroenterology to evaluate your abdominal pain and nausea, weight loss.  Depending on your GI evaluation, we may decide to perform a more extensive lung evaluation with a bronchoscopy and culture to look for infection in the airways.  Continue flonase, protonix, bentyl as you are taking them  Follow with Dr Delton CoombesByrum next available after your gastroenterology visit to decide next steps.

## 2018-01-27 NOTE — Progress Notes (Signed)
   Subjective:    Patient ID: Stephanie Clay, female    DOB: 10-18-1932, 84 y.o.   MRN: [id number]  HPI  ROV 01/27/18 --84 year old woman who follows up today for her history of bronchiectasis.  Based on CT scan appearance we suspected that she is colonized with mycobacterial disease although this is never been culture proven.  She also has a history of allergic rhinitis, lower extremity DVT, aspiration symptoms, GERD.   She has been having trouble beginning over a month ago, a lot of nausea without emesis, weakness and dehydration. Has had two falls in the last month. She was admitted [facility] end of March, received IVF, was treated empirically for possible CAP. She has a stable cough, non-productive. She has active GERD, frequent aspiration sx. She had a CT chest 01/01/18 >> slight progression B nodular infiltrates and bronchiectasis.    Review of Systems As per HPI     Objective:   Physical Exam Vitals:   01/27/18 0924 01/27/18 0925  BP:  110/78  Pulse:  60  SpO2:  96%  Weight: 78 lb (35.4 kg)   Height: 4\' 11"  (1.499 m)    Gen: pleasant thin elderly woman, in no distress,  normal affect  ENT: No lesions,  mouth clear,  oropharynx clear, no postnasal drip  stridor  Lungs: No use of accessory muscles, distant, B insp squeaks, no wheezes   Cardiovascular: RRR, heart sounds normal, no murmur or gallops, no peripheral edema  Musculoskeletal: No deformities, no cyanosis or clubbing  Neuro: alert, non focal  Skin: Warm, no lesions or rashes      Assessment & Plan:  Abdominal pain Her most bothersome sx are abdominal and poor PO intake.  Certainly being colonized with mycobacterial disease could cause the latter.  Sounds like irritable bowel syndrome + GERD / hiatal hernia. Has been treated w bentyl. Has severe sx and malnutrition, no longer followed by GI in [place].  If her GI evaluation is unremarkable for any new process then we may be pushed to perform bronchoscopy, consider  treatment of mycobacterial disease.  Unfortunately all the medications that we would use to treat chronic Mycobacterium are going to cause nausea, abdominal discomfort also.  Bronchiectasis with evidence of chronic atypical infection Slight progression bilaterally by CT scan of the chest 01/01/18.  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  We have never actually proven it and have never been pushed to do so.  If she continues to feel badly with systemic symptoms, malnutrition, nausea then we may need to perform bronchoscopy to help guide treatment.  Levy Pupaobert Juley Giovanetti, MD, PhD 01/27/2018, 10:07 AM [facility] [phone] or if no answer [phone]

## 2018-01-27 NOTE — Assessment & Plan Note (Signed)
Her most bothersome sx are abdominal and poor PO intake.  Certainly being colonized with mycobacterial disease could cause the latter.  Sounds like irritable bowel syndrome + GERD / hiatal hernia. Has been treated w bentyl. Has severe sx and malnutrition, no longer followed by GI in [place].  If her GI evaluation is unremarkable for any new process then we may be pushed to perform bronchoscopy, consider treatment of mycobacterial disease.  Unfortunately all the medications that we would use to treat chronic Mycobacterium are going to cause nausea, abdominal discomfort also.

## 2018-01-27 NOTE — Assessment & Plan Note (Signed)
Slight progression bilaterally by CT scan of the chest 01/01/18.  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  We have never actually proven it and have never been pushed to do so.  If she continues to feel badly with systemic symptoms, malnutrition, nausea then we may need to perform bronchoscopy to help guide treatment.

## 2018-01-28 NOTE — Telephone Encounter (Signed)
Absolutely She will like Brett CanalesSteve.

## 2018-01-29 ENCOUNTER — Encounter: Payer: Self-pay | Admitting: Gastroenterology

## 2018-01-29 NOTE — Telephone Encounter (Signed)
Pt scheduled with Dr. Adela LankArmbruster for 03-13-18 and patient notified

## 2018-01-29 NOTE — Telephone Encounter (Signed)
I'm okay with the switch, only if okay with Dr. Chales AbrahamsGupta

## 2018-01-29 NOTE — Telephone Encounter (Signed)
Dr. Armbruster will you accept this patient? °

## 2018-02-17 DIAGNOSIS — N183 Chronic kidney disease, stage 3 (moderate): Secondary | ICD-10-CM | POA: Diagnosis not present

## 2018-02-17 DIAGNOSIS — I10 Essential (primary) hypertension: Secondary | ICD-10-CM | POA: Diagnosis not present

## 2018-02-17 DIAGNOSIS — D520 Dietary folate deficiency anemia: Secondary | ICD-10-CM | POA: Diagnosis not present

## 2018-02-17 DIAGNOSIS — R309 Painful micturition, unspecified: Secondary | ICD-10-CM | POA: Diagnosis not present

## 2018-02-17 DIAGNOSIS — E039 Hypothyroidism, unspecified: Secondary | ICD-10-CM | POA: Diagnosis not present

## 2018-02-17 DIAGNOSIS — D649 Anemia, unspecified: Secondary | ICD-10-CM | POA: Diagnosis not present

## 2018-02-17 DIAGNOSIS — E876 Hypokalemia: Secondary | ICD-10-CM | POA: Diagnosis not present

## 2018-02-17 DIAGNOSIS — N390 Urinary tract infection, site not specified: Secondary | ICD-10-CM | POA: Diagnosis not present

## 2018-02-17 DIAGNOSIS — N189 Chronic kidney disease, unspecified: Secondary | ICD-10-CM | POA: Diagnosis not present

## 2018-02-20 DIAGNOSIS — L03116 Cellulitis of left lower limb: Secondary | ICD-10-CM | POA: Diagnosis not present

## 2018-02-25 DIAGNOSIS — M7062 Trochanteric bursitis, left hip: Secondary | ICD-10-CM | POA: Diagnosis not present

## 2018-02-25 DIAGNOSIS — M7061 Trochanteric bursitis, right hip: Secondary | ICD-10-CM | POA: Diagnosis not present

## 2018-02-25 DIAGNOSIS — L03116 Cellulitis of left lower limb: Secondary | ICD-10-CM | POA: Diagnosis not present

## 2018-03-04 ENCOUNTER — Ambulatory Visit: Payer: Medicare Other | Admitting: Gastroenterology

## 2018-03-13 ENCOUNTER — Ambulatory Visit: Payer: Medicare Other | Admitting: Gastroenterology

## 2018-03-13 ENCOUNTER — Encounter: Payer: Self-pay | Admitting: Gastroenterology

## 2018-03-13 ENCOUNTER — Encounter (INDEPENDENT_AMBULATORY_CARE_PROVIDER_SITE_OTHER): Payer: Self-pay

## 2018-03-13 VITALS — BP 134/60 | HR 68 | Ht 59.0 in | Wt 81.1 lb

## 2018-03-13 DIAGNOSIS — R109 Unspecified abdominal pain: Secondary | ICD-10-CM

## 2018-03-13 DIAGNOSIS — R110 Nausea: Secondary | ICD-10-CM | POA: Diagnosis not present

## 2018-03-13 DIAGNOSIS — R634 Abnormal weight loss: Secondary | ICD-10-CM

## 2018-03-13 DIAGNOSIS — R1310 Dysphagia, unspecified: Secondary | ICD-10-CM | POA: Diagnosis not present

## 2018-03-13 MED ORDER — ONDANSETRON 4 MG PO TBDP: 4.0000 mg | ORAL_TABLET | Freq: Three times a day (TID) | ORAL | 1 refills | Status: DC

## 2018-03-13 NOTE — Patient Instructions (Signed)
If you are age 82 or older, your body mass index should be between 23-30. Your Body mass index is 16.39 kg/m. If this is out of the aforementioned range listed, please consider follow up with your Primary Care Provider.  If you are age 15 or younger, your body mass index should be between 19-25. Your Body mass index is 16.39 kg/m. If this is out of the aformentioned range listed, please consider follow up with your Primary Care Provider.   You have been scheduled for an endoscopy. Please follow written instructions given to you at your visit today. If you use inhalers (even only as needed), please bring them with you on the day of your procedure. Your physician has requested that you go to www.startemmi.com and enter the access code given to you at your visit today. This web site gives a general overview about your procedure. However, you should still follow specific instructions given to you by our office regarding your preparation for the procedure.  We have sent the following medications to your pharmacy for you to pick up at your convenience: Zofran 4 mg: Take every 8 hours  Increase your Protonix to twice a day.  Please let the pharmacy know when you need a refill.  Thank you for entrusting me with your care and for choosing [facility], Dr. Ileene Patrick

## 2018-03-13 NOTE — Progress Notes (Signed)
HPI :  84 year old female with a history of bronchiectasis, GERD, anemia, here for new patient visit to evaluate multiple complaints as outlined below, referred by Blane Ohara MD.  She is present with her daughter today. She endorses dysphagia located at her sternal notch for the past 3 months or so. She reports dysphagia is mostly to pills and solids, however also experiences this to liquids at times. She denies odynophagia. She does endorse having long-standing history of reflux symptoms, she is taking Protonix 40 mg once a day and states she continues to have breakthrough symptoms despite this. She is frequently nauseated but does not vomit. Her appetite can fluctuate sometimes she eats better than others. She is eating about 2 meals a day usually only breakfast and dinner. She does not eat much for lunch. She has lost some weight over the past 2 years. She was weighing about 110 pounds about 18 months ago, major at 77 pounds, has now got back up to 81 pounds.  She does endorse having abdominal pain, "all over" her abdomen but seems to localize it in the upper to mid abdomen. This tends to come and go periodically. She feels this about 3-4 times a week. Sometimes eating can make the pain worse or makes her not want to eat anymore. She states this pain has been ongoing for several years. She states she has always had a sensitive stomach. She had a contrast CT scan of the abdomen and pelvis in March 2018 which showed a stable small splenic arterial aneurysm and some aortoiliac atherosclerosis, without any other acute findings or critical areas of stenosis. No bowel wall thickening. She had a noncontrast CT scan of her abdomen for this complaint in July 2018 which did not show any acute changes. She's followed by pulmonology for history of bronchiectasis.   She states she had an EGD done by Dr. Chales Abrahams in 06/2012 - this showed a normal esophagus, small hiatal hernia, mild gastritis, and normal duodenum.  Biopsies showed a normal small bowel. 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 She had a colonoscopy on 02/2009 with Dr. Chales Abrahams showing 8mm descending colon polyps and sigmoid diverticulosis. No path available.    Past Medical History:  Diagnosis Date  . Anemia   . Anxiety   . Asthma   . Bronchiectasis without acute exacerbation (HCC) 01/03/2015   CT chest 12/2014:  Bronchiectasis throughout right lung, left clear.  +tree and bud, + patchy gg infiltrates in RLL Sputum 2016: no growth, AFB smear neg PFT's 02/2015:  Normal except mild decrease in DLCO   . Chronic chest pain   . Gastro-esophageal reflux   . Hyperlipemia   . Iron (Fe) deficiency anemia   . Pneumathemia (HCC)   . Scoliosis   . Secondary hyperparathyroidism (HCC)   . UTI (urinary tract infection)   . Varicose veins    Bilateral leg     Past Surgical History:  Procedure Laterality Date  . ABDOMINAL HYSTERECTOMY    . CYSTOCELE REPAIR    . SALPINGOOPHORECTOMY     Family History  Problem Relation Age of Onset  . Emphysema Father   . Heart disease Mother   . Cancer Sister        metastatic lung cancer  . Heart disease Sister   . Hypertension Sister   . Varicose Veins Sister   . Hyperlipidemia Daughter   . Hypertension Daughter   .  Diabetes Daughter    Social History   Tobacco Use  . Smoking status: Never Smoker  . Smokeless tobacco: Never Used  Substance Use Topics  . Alcohol use: No    Alcohol/week: 0.0 oz  . Drug use: No   Current Outpatient Medications  Medication Sig Dispense Refill  . albuterol (PROVENTIL HFA;VENTOLIN HFA) 108 (90 Base) MCG/ACT inhaler Inhale 2 puffs into the lungs every 6 (six) hours as needed for wheezing or shortness of breath.    . ALPRAZolam (XANAX) 0.5 MG tablet Take 0.5 mg by mouth 2 (two) times daily as needed for anxiety.    Marland Kitchen atenolol-chlorthalidone (TENORETIC) 100-25 MG tablet Take 1  tablet by mouth daily.    . cyanocobalamin 500 MCG tablet Take 500 mcg by mouth daily.    Marland Kitchen dicyclomine (BENTYL) 10 MG capsule Take 10 mg by mouth 4 (four) times daily -  before meals and at bedtime.    . fluticasone (FLONASE) 50 MCG/ACT nasal spray Place 2 sprays into both nostrils as needed.     . furosemide (LASIX) 20 MG tablet Take 2 tablets by mouth twice a day for 2 days then go down to 1 tablet by mouth twice a day 90 tablet 3  . gabapentin (NEURONTIN) 100 MG capsule Take 100 mg by mouth 3 (three) times daily.  2  . HYDROcodone-acetaminophen (NORCO/VICODIN) 5-325 MG tablet Take 1 tablet by mouth every 6 (six) hours as needed for moderate pain.    . hydrOXYzine (ATARAX/VISTARIL) 25 MG tablet Take 25 mg by mouth 3 (three) times daily as needed.    Marland Kitchen levothyroxine (SYNTHROID, LEVOTHROID) 50 MCG tablet Take 50 mcg by mouth daily before breakfast.    . meclizine (ANTIVERT) 12.5 MG tablet Take 12.5 mg by mouth every 8 (eight) hours as needed for dizziness.  1  . nitroGLYCERIN (NITROSTAT) 0.4 MG SL tablet Place 0.4 mg under the tongue every 5 (five) minutes as needed for chest pain. X 3 doses    . ondansetron (ZOFRAN) 4 MG tablet Take 4 mg by mouth every 8 (eight) hours as needed for nausea or vomiting.    . pantoprazole (PROTONIX) 40 MG tablet Take 40 mg by mouth daily.    . potassium chloride SA (K-DUR,KLOR-CON) 20 MEQ tablet Take 40 mEq by mouth 3 (three) times daily.    . simvastatin (ZOCOR) 40 MG tablet Take 40 mg by mouth daily.     Marland Kitchen triamcinolone cream (KENALOG) 0.1 % Apply 1 application topically 2 (two) times daily as needed.      No current facility-administered medications for this visit.    Allergies  Allergen Reactions  . Ciprofloxacin     unknown  . Levofloxacin     unknonw  . Nitrofurantoin     unknown     Review of Systems: All systems reviewed and negative except where noted in HPI.   Lab Results  Component Value Date   WBC 13.2 (H) 07/07/2016   HGB 10.9 (L)  07/07/2016   HCT 35.1 (L) 07/07/2016   MCV 93.6 07/07/2016   PLT 214 07/07/2016    Lab Results  Component Value Date   CREATININE 1.17 (H) 03/26/2017   BUN 34 (H) 03/26/2017   NA 145 (H) 03/26/2017   K 4.2 03/26/2017   CL 101 03/26/2017   CO2 27 03/26/2017   Lab Results  Component Value Date   ALT 10 03/19/2017   AST 19 03/19/2017   ALKPHOS 53 03/19/2017   BILITOT 0.3 03/19/2017  Labs from 12/16/17 show Cr of 1.13 with BUN of 31  Labs from 02/17/2018 show Hgb of 10.6, WBC 11, MCV 87, plt 240 Normal LFTs and renal function Ferritin 21, iron 54, iron sat 17%   Physical Exam: BP 134/60   Pulse 68   Ht  (1.499 m)   Wt 81 lb 2 oz (36.8 kg)   BMI 16.39 kg/m  Constitutional: Pleasant, thin appearing female in no acute distress. HEENT: Normocephalic and atraumatic. Conjunctivae are normal. No scleral icterus. Neck supple.  Cardiovascular: Normal rate, regular rhythm.  Pulmonary/chest: Effort normal and breath sounds normal.  Abdominal: Soft, scaphoid abdomen, nontender.  There are no masses palpable.  Extremities: no edema Lymphadenopathy: No cervical adenopathy noted. Neurological: Alert and oriented to person place and time. Skin: Skin is warm and dry. No rashes noted. Psychiatric: Normal mood and affect. Behavior is normal.   ASSESSMENT AND PLAN: 84 year old female with history of bronchiectasis and medical problems as outlined above, here for new patient visit to discuss the following issues:  Dysphagia / nausea / weight loss / abdominal pain - dysphagia is recurrent from previous, however her weight loss and abdominal pains been ongoing for longer than this. She does have bronchiectasis which could potentially be related to some of her weight loss and loss of appetite. Prior CT scans have not showed a clear cause for her pain or weight loss, there is no evidence of malignancy. Her symptoms could be consistent with mesenteric ischemia. Contrast CT scan over a year  ago did not show any significant / critical stenosis but this is possible if things have changed over time. She's had a prior EGD with Shatski ring likely causing her dysphagia. I offered her another EGD as this dysphagia may be related to her decreased PO intake and ensure nothing obvious causing her pain given her postprandial symptoms. We will try to do this in the first available slot open, it should be done soon. I discussed risks / benefits with her and she wanted to proceed. In the interim we'll increase her Protonix to 40 mg twice a day and add Zofran 4 mg every 8 hours as needed. If her EGD fails to how any clear cause for her symptoms, we'll proceed with CT angiogram to assess for pathology to cause mesenteric ischemic changes if her renal function allows. We may also consider starting Remeron to help stimulate her appetite. Recommend drinking Boost / Ensure to help increase caloric intake in the interim.   She agreed with the plan.  Ileene Patrick, MD [facility]  CC: Blane Ohara, MD

## 2018-03-17 ENCOUNTER — Encounter: Payer: Self-pay | Admitting: Gastroenterology

## 2018-03-18 ENCOUNTER — Other Ambulatory Visit: Payer: Self-pay

## 2018-03-18 MED ORDER — PANTOPRAZOLE SODIUM 40 MG PO TBEC: 40.0000 mg | DELAYED_RELEASE_TABLET | Freq: Two times a day (BID) | ORAL | 5 refills | Status: DC

## 2018-03-18 NOTE — Progress Notes (Signed)
Refill Protonix  BID.

## 2018-03-25 ENCOUNTER — Other Ambulatory Visit: Payer: Self-pay

## 2018-03-25 ENCOUNTER — Encounter: Payer: Self-pay | Admitting: Gastroenterology

## 2018-03-25 ENCOUNTER — Ambulatory Visit (AMBULATORY_SURGERY_CENTER): Payer: Medicare Other | Admitting: Gastroenterology

## 2018-03-25 VITALS — BP 98/62 | HR 60 | Temp 98.2°F | Resp 13 | Ht 59.0 in | Wt 81.0 lb

## 2018-03-25 DIAGNOSIS — R1310 Dysphagia, unspecified: Secondary | ICD-10-CM

## 2018-03-25 DIAGNOSIS — R634 Abnormal weight loss: Secondary | ICD-10-CM | POA: Diagnosis not present

## 2018-03-25 DIAGNOSIS — K222 Esophageal obstruction: Secondary | ICD-10-CM

## 2018-03-25 DIAGNOSIS — R630 Anorexia: Secondary | ICD-10-CM | POA: Diagnosis not present

## 2018-03-25 DIAGNOSIS — K2950 Unspecified chronic gastritis without bleeding: Secondary | ICD-10-CM | POA: Diagnosis not present

## 2018-03-25 MED ORDER — SODIUM CHLORIDE 0.9 % IV SOLN: 500.0000 mL | Freq: Once | INTRAVENOUS | Status: DC

## 2018-03-25 NOTE — Patient Instructions (Signed)
YOU HAD AN ENDOSCOPIC PROCEDURE TODAY AT THE [facility]:   Refer to the procedure report that was given to you for any specific questions about what was found during the examination.  If the procedure report does not answer your questions, please call your gastroenterologist to clarify.  If you requested that your care partner not be given the details of your procedure findings, then the procedure report has been included in a sealed envelope for you to review at your convenience later.  YOU SHOULD EXPECT: Some feelings of bloating in the abdomen. Passage of more gas than usual.  Walking can help get rid of the air that was put into your GI tract during the procedure and reduce the bloating. If you had a lower endoscopy (such as a colonoscopy or flexible sigmoidoscopy) you may notice spotting of blood in your stool or on the toilet paper. If you underwent a bowel prep for your procedure, you may not have a normal bowel movement for a few days.  Please Note:  You might notice some irritation and congestion in your nose or some drainage.  This is from the oxygen used during your procedure.  There is no need for concern and it should clear up in a day or so.  SYMPTOMS TO REPORT IMMEDIATELY:   Following upper endoscopy (EGD)  Vomiting of blood or coffee ground material  New chest pain or pain under the shoulder blades  Painful or persistently difficult swallowing  New shortness of breath  Fever of 100F or higher  Black, tarry-looking stools  For urgent or emergent issues, a gastroenterologist can be reached at any hour by calling [phone].   DIET:  Nothing by mouth until 9 am then clear liquids for an hour. Start soft diet at 10 am for the rest of the day, then you may proceed to your regular diet in the a,..  Drink plenty of fluids but you should avoid alcoholic beverages for 24 hours.  ACTIVITY:  You should plan to take it easy for the rest of today and you should NOT DRIVE  or use heavy machinery until tomorrow (because of the sedation medicines used during the test).    FOLLOW UP: Our staff will call the number listed on your records the next business day following your procedure to check on you and address any questions or concerns that you may have regarding the information given to you following your procedure. If we do not reach you, we will leave a message.  However, if you are feeling well and you are not experiencing any problems, there is no need to return our call.  We will assume that you have returned to your regular daily activities without incident.  If any biopsies were taken you will be contacted by phone or by letter within the next 1-3 weeks.  Please call us at [phone] if you have not heard about the biopsies in 3 weeks.    SIGNATURES/CONFIDENTIALITY: You and/or your care partner have signed paperwork which will be entered into your electronic medical record.  These signatures attest to the fact that that the information above on your After Visit Summary has been reviewed and is understood.  Full responsibility of the confidentiality of this discharge information lies with you and/or your care-partner.  Thank you for letting us take care of your healthcare needs today.

## 2018-03-25 NOTE — Progress Notes (Signed)
Called to room to assist during endoscopic procedure.  Patient ID and intended procedure confirmed with present staff. Received instructions for my participation in the procedure from the performing physician.  

## 2018-03-25 NOTE — Progress Notes (Signed)
Spontaneous respirations throughout. VSS. Resting comfortably. To PACU on room air. Report to  RN. 

## 2018-03-25 NOTE — Op Note (Signed)
[facility] Patient Name: Stephanie Clay Procedure Date: 03/25/2018 7:30 AM MRN: [id number] Endoscopist: Viviann Spare P. Adela Lank , MD Age: 84 Referring MD:  Date of Birth: 1932-01-17 Gender: Female Account #: [id number] Procedure:                Upper GI endoscopy Indications:              Dysphagia, Weight loss, nonspecific abdominal pain Medicines:                Monitored Anesthesia Care Procedure:                Pre-Anesthesia Assessment:                           - Prior to the procedure, a History and Physical                            was performed, and patient medications and                            allergies were reviewed. The patient's tolerance of                            previous anesthesia was also reviewed. The risks                            and benefits of the procedure and the sedation                            options and risks were discussed with the patient.                            All questions were answered, and informed consent                            was obtained. Prior Anticoagulants: The patient has                            taken no previous anticoagulant or antiplatelet                            agents. ASA Grade Assessment: III - A patient with                            severe systemic disease. After reviewing the risks                            and benefits, the patient was deemed in                            satisfactory condition to undergo the procedure.                           After obtaining informed consent, the endoscope was  passed under direct vision. Throughout the                            procedure, the patient's blood pressure, pulse, and                            oxygen saturations were monitored continuously. The                            Model GIF-HQ190 [id number]) scope was introduced                            through the mouth, and advanced to the second part                             of duodenum. The upper GI endoscopy was                            accomplished without difficulty. The patient                            tolerated the procedure well. Scope In: Scope Out: Findings:                 Esophagogastric landmarks were identified: the                            Z-line was found at 34 cm, the gastroesophageal                            junction was found at 34 cm and the upper extent of                            the gastric folds was found at 34 cm from the                            incisors.                           The exam of the esophagus was otherwise normal. No                            stenosis or stricture that was obvious.                           A guidewire was placed and the scope was withdrawn.                            Empiric dilation was performed in the entire                            esophagus with a Savary dilator with mild  resistance at 17 mm. Relook endoscopy showed an                            appropriate mucosal wrent was noted in the UES /                            proximal esophagus post dilation.                           The entire examined stomach was normal. Biopsies                            were taken from the antrum, body, and incisura with                            a cold forceps for Helicobacter pylori testing.                           The duodenal bulb and second portion of the                            duodenum were normal. Complications:            No immediate complications. Estimated blood loss:                            Minimal. Estimated Blood Loss:     Estimated blood loss was minimal. Impression:               - Esophagogastric landmarks identified.                           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                           - Normal stomach. Biopsied to rule out H pylori.                           - Normal duodenal bulb and second portion of the                            duodenum. Recommendation:           - Patient has a contact number available for                            emergencies. The signs and  symptoms of potential                            delayed complications were discussed with the                            patient. Return to normal activities tomorrow.                            Written discharge instructions were provided to the                            patient.                           - Post-dilation diet.                           - Continue present medications.                           - Await pathology results.                           - If pathology results negative, consider CT                            angiogram to assess for mesenteric ischemia Viviann Spare P. Bran Aldridge, MD 03/25/2018 7:55:23 AM This report has been signed electronically.

## 2018-03-26 ENCOUNTER — Telehealth: Payer: Self-pay

## 2018-03-26 NOTE — Telephone Encounter (Signed)
  Follow up Call-  Call back number 03/25/2018  Post procedure Call Back phone  # [phone] Eunice Blase([name])  Permission to leave phone message Yes  Some recent data might be hidden     Patient questions:  Do you have a fever, pain , or abdominal swelling? No. Pain Score  0 *  Have you tolerated food without any problems? Yes.    Have you been able to return to your normal activities? Yes.    Do you have any questions about your discharge instructions: Diet   No. Medications  No. Follow up visit  No.  Do you have questions or concerns about your Care? No.  Actions: * If pain score is 4 or above: No action needed, pain <4.

## 2018-04-02 ENCOUNTER — Other Ambulatory Visit: Payer: Self-pay

## 2018-04-02 ENCOUNTER — Telehealth: Payer: Self-pay | Admitting: Gastroenterology

## 2018-04-02 DIAGNOSIS — R109 Unspecified abdominal pain: Secondary | ICD-10-CM

## 2018-04-02 NOTE — Telephone Encounter (Signed)
Asked patient's daughter to have her mom here early in the morning on 6/17 so that the labs will be back in time to evaluate kidney function.

## 2018-04-07 ENCOUNTER — Other Ambulatory Visit (INDEPENDENT_AMBULATORY_CARE_PROVIDER_SITE_OTHER): Payer: Medicare Other

## 2018-04-07 DIAGNOSIS — R109 Unspecified abdominal pain: Secondary | ICD-10-CM

## 2018-04-07 LAB — BASIC METABOLIC PANEL
BUN: 30 mg/dL — ABNORMAL HIGH (ref 6–23)
CHLORIDE: 99 meq/L (ref 96–112)
CO2: 35 mEq/L — ABNORMAL HIGH (ref 19–32)
Calcium: 9.7 mg/dL (ref 8.4–10.5)
Creatinine, Ser: 1.14 mg/dL (ref 0.40–1.20)
GFR: 48.24 mL/min — AB (ref >60.00)
GLUCOSE: 90 mg/dL (ref 70–99)
POTASSIUM: 3.9 meq/L (ref 3.5–5.1)
Sodium: 142 mEq/L (ref 135–145)

## 2018-04-08 DIAGNOSIS — E782 Mixed hyperlipidemia: Secondary | ICD-10-CM | POA: Diagnosis not present

## 2018-04-08 DIAGNOSIS — E038 Other specified hypothyroidism: Secondary | ICD-10-CM | POA: Diagnosis not present

## 2018-04-08 DIAGNOSIS — R627 Adult failure to thrive: Secondary | ICD-10-CM | POA: Diagnosis not present

## 2018-04-08 DIAGNOSIS — I10 Essential (primary) hypertension: Secondary | ICD-10-CM | POA: Diagnosis not present

## 2018-04-08 DIAGNOSIS — E559 Vitamin D deficiency, unspecified: Secondary | ICD-10-CM | POA: Diagnosis not present

## 2018-04-09 ENCOUNTER — Ambulatory Visit (HOSPITAL_COMMUNITY)
Admission: RE | Admit: 2018-04-09 | Discharge: 2018-04-09 | Disposition: A | Discharge status: Home / Self Care | Payer: Medicare Other | Source: Ambulatory Visit | Attending: Gastroenterology | Admitting: Gastroenterology

## 2018-04-09 ENCOUNTER — Encounter (HOSPITAL_COMMUNITY): Payer: Self-pay

## 2018-04-09 DIAGNOSIS — R918 Other nonspecific abnormal finding of lung field: Secondary | ICD-10-CM | POA: Insufficient documentation

## 2018-04-09 DIAGNOSIS — I728 Aneurysm of other specified arteries: Secondary | ICD-10-CM | POA: Insufficient documentation

## 2018-04-09 DIAGNOSIS — R109 Unspecified abdominal pain: Secondary | ICD-10-CM | POA: Diagnosis not present

## 2018-04-09 MED ORDER — IOPAMIDOL (ISOVUE-370) INJECTION 76%
100.0000 mL | Freq: Once | INTRAVENOUS | Status: AC | PRN
  Administered 2018-04-09: 100 mL via INTRAVENOUS

## 2018-04-09 MED ORDER — IOPAMIDOL (ISOVUE-370) INJECTION 76%
INTRAVENOUS | Status: AC
  Filled 2018-04-09: qty 100

## 2018-07-09 DIAGNOSIS — R002 Palpitations: Secondary | ICD-10-CM | POA: Diagnosis not present

## 2018-07-09 DIAGNOSIS — J168 Pneumonia due to other specified infectious organisms: Secondary | ICD-10-CM | POA: Diagnosis not present

## 2018-07-09 DIAGNOSIS — E038 Other specified hypothyroidism: Secondary | ICD-10-CM | POA: Diagnosis not present

## 2018-07-09 DIAGNOSIS — J208 Acute bronchitis due to other specified organisms: Secondary | ICD-10-CM | POA: Diagnosis not present

## 2018-07-09 DIAGNOSIS — I10 Essential (primary) hypertension: Secondary | ICD-10-CM | POA: Diagnosis not present

## 2018-07-09 DIAGNOSIS — E782 Mixed hyperlipidemia: Secondary | ICD-10-CM | POA: Diagnosis not present

## 2018-07-11 DIAGNOSIS — R079 Chest pain, unspecified: Secondary | ICD-10-CM | POA: Diagnosis not present

## 2018-07-11 DIAGNOSIS — R0602 Shortness of breath: Secondary | ICD-10-CM | POA: Diagnosis not present

## 2018-07-11 DIAGNOSIS — R05 Cough: Secondary | ICD-10-CM | POA: Diagnosis not present

## 2018-08-07 DIAGNOSIS — E782 Mixed hyperlipidemia: Secondary | ICD-10-CM | POA: Diagnosis not present

## 2018-08-07 DIAGNOSIS — R002 Palpitations: Secondary | ICD-10-CM | POA: Diagnosis not present

## 2018-08-07 DIAGNOSIS — Z23 Encounter for immunization: Secondary | ICD-10-CM | POA: Diagnosis not present

## 2018-08-07 DIAGNOSIS — R0602 Shortness of breath: Secondary | ICD-10-CM | POA: Diagnosis not present

## 2018-08-07 DIAGNOSIS — I10 Essential (primary) hypertension: Secondary | ICD-10-CM | POA: Diagnosis not present

## 2018-08-11 DIAGNOSIS — E876 Hypokalemia: Secondary | ICD-10-CM | POA: Diagnosis not present

## 2018-08-11 DIAGNOSIS — D649 Anemia, unspecified: Secondary | ICD-10-CM | POA: Diagnosis not present

## 2018-08-11 DIAGNOSIS — E559 Vitamin D deficiency, unspecified: Secondary | ICD-10-CM | POA: Diagnosis not present

## 2018-08-11 DIAGNOSIS — N183 Chronic kidney disease, stage 3 (moderate): Secondary | ICD-10-CM | POA: Diagnosis not present

## 2018-08-11 DIAGNOSIS — I10 Essential (primary) hypertension: Secondary | ICD-10-CM | POA: Diagnosis not present

## 2018-08-11 DIAGNOSIS — E039 Hypothyroidism, unspecified: Secondary | ICD-10-CM | POA: Diagnosis not present

## 2018-08-11 DIAGNOSIS — N189 Chronic kidney disease, unspecified: Secondary | ICD-10-CM | POA: Diagnosis not present

## 2018-08-11 DIAGNOSIS — R809 Proteinuria, unspecified: Secondary | ICD-10-CM | POA: Diagnosis not present

## 2018-08-11 DIAGNOSIS — N390 Urinary tract infection, site not specified: Secondary | ICD-10-CM | POA: Diagnosis not present

## 2018-08-11 DIAGNOSIS — E211 Secondary hyperparathyroidism, not elsewhere classified: Secondary | ICD-10-CM | POA: Diagnosis not present

## 2018-08-11 DIAGNOSIS — R309 Painful micturition, unspecified: Secondary | ICD-10-CM | POA: Diagnosis not present

## 2018-08-21 DIAGNOSIS — M1712 Unilateral primary osteoarthritis, left knee: Secondary | ICD-10-CM | POA: Diagnosis not present

## 2018-08-21 DIAGNOSIS — M1711 Unilateral primary osteoarthritis, right knee: Secondary | ICD-10-CM | POA: Diagnosis not present

## 2018-08-21 DIAGNOSIS — R197 Diarrhea, unspecified: Secondary | ICD-10-CM | POA: Diagnosis not present

## 2018-08-28 DIAGNOSIS — R112 Nausea with vomiting, unspecified: Secondary | ICD-10-CM | POA: Diagnosis not present

## 2018-08-28 DIAGNOSIS — R918 Other nonspecific abnormal finding of lung field: Secondary | ICD-10-CM | POA: Diagnosis not present

## 2018-08-28 DIAGNOSIS — I10 Essential (primary) hypertension: Secondary | ICD-10-CM | POA: Diagnosis not present

## 2018-08-28 DIAGNOSIS — E7800 Pure hypercholesterolemia, unspecified: Secondary | ICD-10-CM | POA: Diagnosis not present

## 2018-08-28 DIAGNOSIS — R1084 Generalized abdominal pain: Secondary | ICD-10-CM | POA: Diagnosis not present

## 2018-08-28 DIAGNOSIS — R197 Diarrhea, unspecified: Secondary | ICD-10-CM | POA: Diagnosis not present

## 2018-08-28 DIAGNOSIS — R109 Unspecified abdominal pain: Secondary | ICD-10-CM | POA: Diagnosis not present

## 2018-08-28 DIAGNOSIS — R52 Pain, unspecified: Secondary | ICD-10-CM | POA: Diagnosis not present

## 2018-08-28 DIAGNOSIS — R1013 Epigastric pain: Secondary | ICD-10-CM | POA: Diagnosis not present

## 2018-09-04 DIAGNOSIS — M1711 Unilateral primary osteoarthritis, right knee: Secondary | ICD-10-CM | POA: Diagnosis not present

## 2018-09-04 DIAGNOSIS — K5904 Chronic idiopathic constipation: Secondary | ICD-10-CM | POA: Diagnosis not present

## 2018-09-04 DIAGNOSIS — R1084 Generalized abdominal pain: Secondary | ICD-10-CM | POA: Diagnosis not present

## 2018-09-04 DIAGNOSIS — J4540 Moderate persistent asthma, uncomplicated: Secondary | ICD-10-CM | POA: Diagnosis not present

## 2018-09-16 DIAGNOSIS — J4540 Moderate persistent asthma, uncomplicated: Secondary | ICD-10-CM | POA: Diagnosis not present

## 2018-09-16 DIAGNOSIS — J018 Other acute sinusitis: Secondary | ICD-10-CM | POA: Diagnosis not present

## 2018-09-16 DIAGNOSIS — K5904 Chronic idiopathic constipation: Secondary | ICD-10-CM | POA: Diagnosis not present

## 2018-09-16 DIAGNOSIS — G252 Other specified forms of tremor: Secondary | ICD-10-CM | POA: Diagnosis not present

## 2018-10-27 DIAGNOSIS — R5383 Other fatigue: Secondary | ICD-10-CM | POA: Diagnosis not present

## 2018-10-27 DIAGNOSIS — J4540 Moderate persistent asthma, uncomplicated: Secondary | ICD-10-CM | POA: Diagnosis not present

## 2018-10-28 ENCOUNTER — Other Ambulatory Visit: Payer: Self-pay

## 2018-10-28 MED ORDER — PANTOPRAZOLE SODIUM 40 MG PO TBEC: 40.0000 mg | DELAYED_RELEASE_TABLET | Freq: Two times a day (BID) | ORAL | 5 refills | Status: DC

## 2018-10-28 NOTE — Progress Notes (Signed)
Refill sent for Pantoprazole 40mg  BID [facility].  Fax request rec'd.  Pt last seen 02-2018

## 2018-12-03 DIAGNOSIS — K6389 Other specified diseases of intestine: Secondary | ICD-10-CM | POA: Diagnosis not present

## 2018-12-03 DIAGNOSIS — R109 Unspecified abdominal pain: Secondary | ICD-10-CM | POA: Diagnosis not present

## 2018-12-03 DIAGNOSIS — R1084 Generalized abdominal pain: Secondary | ICD-10-CM | POA: Diagnosis not present

## 2018-12-03 DIAGNOSIS — I959 Hypotension, unspecified: Secondary | ICD-10-CM | POA: Diagnosis not present

## 2018-12-03 DIAGNOSIS — R531 Weakness: Secondary | ICD-10-CM | POA: Diagnosis not present

## 2018-12-03 DIAGNOSIS — R197 Diarrhea, unspecified: Secondary | ICD-10-CM | POA: Diagnosis not present

## 2018-12-03 DIAGNOSIS — R110 Nausea: Secondary | ICD-10-CM | POA: Diagnosis not present

## 2018-12-03 DIAGNOSIS — R0902 Hypoxemia: Secondary | ICD-10-CM | POA: Diagnosis not present

## 2018-12-03 DIAGNOSIS — R1111 Vomiting without nausea: Secondary | ICD-10-CM | POA: Diagnosis not present

## 2018-12-05 ENCOUNTER — Other Ambulatory Visit: Payer: Self-pay

## 2018-12-05 NOTE — Patient Outreach (Signed)
[facility]) Care Management  12/05/2018  Stephanie Clay [birth year] [id number]   TELEPHONE SCREENING Referral date: 12/05/18 Referral source: utilization management Referral reason: unable to complete ER assessment, weak Insurance: [employer] health care Attempt #1  Telephone call to patient regarding referral. Unable to reach patient. HIPAA compliant voice message left with call back phone number.   PLAN: RNCM will attempt 2nd telephone call to patient within 4 business days. RNCM will send outreach letter.   George Ina RN,BSN, CCM [facility] Telephonic  [phone]

## 2018-12-08 DIAGNOSIS — R1314 Dysphagia, pharyngoesophageal phase: Secondary | ICD-10-CM | POA: Diagnosis not present

## 2018-12-08 DIAGNOSIS — K523 Indeterminate colitis: Secondary | ICD-10-CM | POA: Diagnosis not present

## 2018-12-08 DIAGNOSIS — E43 Unspecified severe protein-calorie malnutrition: Secondary | ICD-10-CM | POA: Diagnosis not present

## 2018-12-09 ENCOUNTER — Ambulatory Visit: Payer: Self-pay

## 2018-12-09 ENCOUNTER — Other Ambulatory Visit: Payer: Self-pay

## 2018-12-09 NOTE — Patient Outreach (Signed)
[facility]) Care Management  12/09/2018  ZAYAH CATOE October 31, 1931 [id number]   TELEPHONE SCREENING Referral date: 12/05/18 Referral source: utilization management Referral reason: unable to complete ER assessment, weak Insurance: United health care  Telephone call to patient regarding utilization management referral. Contact made with patients daughter and designated party release, Lodema Pilot.  HIPAA verified with daughter for patient. Explained reason for call.  RNCM discussed and offered [facility] care management services. Daughter states patient does not have a need for [facility] care management services at this time. Daughter states patient is currently sick. She states patient saw her primary MD on yesterday. Daughter states patient was seen in the emergency room last week for inner ear infection and bowel issues.  Daughter states patients doctor is supposed to refer patient to gastroenterologist.  RNCM offered to send patient [facility] care management brochure/ magnet. Daughter verbally agreed.   PLAN: RNCM will close patient due to patient being assessed and having no further needs.  RNCm will send patient [facility] care brochure/ magnet RNCm will send closure letter to patients primary MD.  George Ina RN,BSN,CCM [facility] Telephonic  [phone]

## 2018-12-24 DIAGNOSIS — R1314 Dysphagia, pharyngoesophageal phase: Secondary | ICD-10-CM | POA: Diagnosis not present

## 2019-01-05 DIAGNOSIS — R1314 Dysphagia, pharyngoesophageal phase: Secondary | ICD-10-CM | POA: Diagnosis not present

## 2019-01-05 DIAGNOSIS — R1032 Left lower quadrant pain: Secondary | ICD-10-CM | POA: Diagnosis not present

## 2019-01-05 DIAGNOSIS — E43 Unspecified severe protein-calorie malnutrition: Secondary | ICD-10-CM | POA: Diagnosis not present

## 2019-03-27 DIAGNOSIS — J018 Other acute sinusitis: Secondary | ICD-10-CM | POA: Diagnosis not present

## 2019-03-27 DIAGNOSIS — R42 Dizziness and giddiness: Secondary | ICD-10-CM | POA: Diagnosis not present

## 2019-04-17 DIAGNOSIS — M545 Low back pain: Secondary | ICD-10-CM | POA: Diagnosis not present

## 2019-05-04 ENCOUNTER — Other Ambulatory Visit: Payer: Self-pay

## 2019-05-04 MED ORDER — PANTOPRAZOLE SODIUM 40 MG PO TBEC: 40.0000 mg | DELAYED_RELEASE_TABLET | Freq: Two times a day (BID) | ORAL | 1 refills | Status: DC

## 2019-05-04 NOTE — Progress Notes (Signed)
rec'd faxed refill request for pantoprazole.  Filled script for 30 days and requested that pharmacist advise pt to schedule an appt for further refills.

## 2019-05-12 DIAGNOSIS — M545 Low back pain: Secondary | ICD-10-CM | POA: Diagnosis not present

## 2019-05-12 DIAGNOSIS — R0781 Pleurodynia: Secondary | ICD-10-CM | POA: Diagnosis not present

## 2019-05-12 DIAGNOSIS — S3992XA Unspecified injury of lower back, initial encounter: Secondary | ICD-10-CM | POA: Diagnosis not present

## 2019-05-12 DIAGNOSIS — R0782 Intercostal pain: Secondary | ICD-10-CM | POA: Diagnosis not present

## 2019-05-13 DIAGNOSIS — M7989 Other specified soft tissue disorders: Secondary | ICD-10-CM | POA: Diagnosis not present

## 2019-05-13 DIAGNOSIS — R0902 Hypoxemia: Secondary | ICD-10-CM | POA: Diagnosis not present

## 2019-05-13 DIAGNOSIS — R531 Weakness: Secondary | ICD-10-CM | POA: Diagnosis not present

## 2019-05-13 DIAGNOSIS — R42 Dizziness and giddiness: Secondary | ICD-10-CM | POA: Diagnosis not present

## 2019-05-13 DIAGNOSIS — S0990XA Unspecified injury of head, initial encounter: Secondary | ICD-10-CM | POA: Diagnosis not present

## 2019-05-13 DIAGNOSIS — S199XXA Unspecified injury of neck, initial encounter: Secondary | ICD-10-CM | POA: Diagnosis not present

## 2019-05-13 DIAGNOSIS — S81012A Laceration without foreign body, left knee, initial encounter: Secondary | ICD-10-CM | POA: Diagnosis not present

## 2019-05-13 DIAGNOSIS — Z9181 History of falling: Secondary | ICD-10-CM | POA: Diagnosis not present

## 2019-05-13 DIAGNOSIS — S299XXA Unspecified injury of thorax, initial encounter: Secondary | ICD-10-CM | POA: Diagnosis not present

## 2019-05-13 DIAGNOSIS — S20211A Contusion of right front wall of thorax, initial encounter: Secondary | ICD-10-CM | POA: Diagnosis not present

## 2019-05-13 DIAGNOSIS — T148XXA Other injury of unspecified body region, initial encounter: Secondary | ICD-10-CM | POA: Diagnosis not present

## 2019-05-13 DIAGNOSIS — S0993XA Unspecified injury of face, initial encounter: Secondary | ICD-10-CM | POA: Diagnosis not present

## 2019-05-13 DIAGNOSIS — W19XXXA Unspecified fall, initial encounter: Secondary | ICD-10-CM | POA: Diagnosis not present

## 2019-05-13 DIAGNOSIS — S0083XA Contusion of other part of head, initial encounter: Secondary | ICD-10-CM | POA: Diagnosis not present

## 2019-05-13 DIAGNOSIS — R0781 Pleurodynia: Secondary | ICD-10-CM | POA: Diagnosis not present

## 2019-05-27 DIAGNOSIS — S20211A Contusion of right front wall of thorax, initial encounter: Secondary | ICD-10-CM | POA: Diagnosis not present

## 2019-05-27 DIAGNOSIS — R42 Dizziness and giddiness: Secondary | ICD-10-CM | POA: Diagnosis not present

## 2019-06-17 DIAGNOSIS — G252 Other specified forms of tremor: Secondary | ICD-10-CM | POA: Diagnosis not present

## 2019-06-30 DIAGNOSIS — R42 Dizziness and giddiness: Secondary | ICD-10-CM | POA: Diagnosis not present

## 2019-07-01 ENCOUNTER — Other Ambulatory Visit: Payer: Self-pay

## 2019-07-01 MED ORDER — PANTOPRAZOLE SODIUM 40 MG PO TBEC: 40.0000 mg | DELAYED_RELEASE_TABLET | Freq: Two times a day (BID) | ORAL | 0 refills | Status: DC

## 2019-07-01 NOTE — Progress Notes (Signed)
Fax request for refill of pantoprazole. Pt last seen 02-2018.  Needs an OV for further refills. Sent 30 days worth and indicated she needs to make an appt for any further refills

## 2019-09-10 ENCOUNTER — Other Ambulatory Visit: Payer: Self-pay

## 2019-09-10 NOTE — Patient Outreach (Signed)
[facility]) Care Management  09/10/2019  Stephanie Clay [birth year] [id number]   Medication Adherence call to [name] Compliant Voice message left with a call back number. [name] is showing past due on Simvastatin under [place].   [place] Management Direct Dial [phone]  Fax [phone] [email or web address]

## 2019-09-23 ENCOUNTER — Other Ambulatory Visit: Payer: Self-pay

## 2019-09-23 NOTE — Patient Outreach (Signed)
[facility]) Care Management  09/23/2019  Stephanie Clay [birth year] [id number]   Medication Adherence call to Stephanie Clay Telephone call to Patient regarding Medication Adherence unable to reach patient. Stephanie Clay is showing past due on Simvastatin 40 mg under [place].   [place] Management Direct Dial [phone]  Fax [phone] [email or web address]

## 2019-10-28 DIAGNOSIS — M545 Low back pain: Secondary | ICD-10-CM | POA: Diagnosis not present

## 2019-10-28 DIAGNOSIS — E038 Other specified hypothyroidism: Secondary | ICD-10-CM | POA: Diagnosis not present

## 2019-10-28 DIAGNOSIS — G8929 Other chronic pain: Secondary | ICD-10-CM | POA: Diagnosis not present

## 2019-10-28 DIAGNOSIS — E782 Mixed hyperlipidemia: Secondary | ICD-10-CM | POA: Diagnosis not present

## 2019-11-26 ENCOUNTER — Telehealth (INDEPENDENT_AMBULATORY_CARE_PROVIDER_SITE_OTHER): Payer: Medicare Other | Admitting: Family Medicine

## 2019-11-26 ENCOUNTER — Encounter: Payer: Self-pay | Admitting: Family Medicine

## 2019-11-26 VITALS — HR 98 | Ht 59.0 in | Wt 73.6 lb

## 2019-11-26 DIAGNOSIS — R5383 Other fatigue: Secondary | ICD-10-CM | POA: Diagnosis not present

## 2019-11-26 DIAGNOSIS — M545 Low back pain, unspecified: Secondary | ICD-10-CM

## 2019-11-26 DIAGNOSIS — E559 Vitamin D deficiency, unspecified: Secondary | ICD-10-CM | POA: Diagnosis not present

## 2019-11-26 DIAGNOSIS — E038 Other specified hypothyroidism: Secondary | ICD-10-CM | POA: Diagnosis not present

## 2019-11-26 DIAGNOSIS — G8929 Other chronic pain: Secondary | ICD-10-CM

## 2019-11-26 DIAGNOSIS — R600 Localized edema: Secondary | ICD-10-CM

## 2019-11-26 HISTORY — DX: Localized edema: R60.0

## 2019-11-26 HISTORY — DX: Other fatigue: R53.83

## 2019-11-26 MED ORDER — FLUTICASONE PROPIONATE 50 MCG/ACT NA SUSP: 2.0000 | Freq: Every day | NASAL | 5 refills | Status: DC

## 2019-11-26 MED ORDER — ASMANEX (60 METERED DOSES) 220 MCG/INH IN AEPB: 2.0000 | INHALATION_SPRAY | Freq: Every day | RESPIRATORY_TRACT | 12 refills | Status: DC

## 2019-11-26 NOTE — Progress Notes (Signed)
Established Patient Office Visit  Subjective:  Patient ID: Stephanie Clay, female    DOB: Feb 13, 1932  Age: 86 y.o. MRN: [id number]  This visit type was conducted due to national recommendations for restrictions regarding the COVID-19 Pandemic (e.g. social distancing) in an effort to limit this patient's exposure and mitigate transmission in our community.  Due to [facility] co-morbid illnesses, this patient is at least at moderate risk for complications without adequate follow up.  This format is felt to be most appropriate for this patient at this time.  The patient did not have access to video technology/had technical difficulties with video requiring transitioning to audio format only (telephone).  All issues noted in this document were discussed and addressed.  No physical exam could be performed with this format.  Patient verbally consented to a telehealth visit.   CC:  Chief Complaint  Patient presents with  . Back Pain  . Asthma  . Depression  . Malnutrition Screening Tool    HPI JEFFERY GAMMELL presents for Back Pain Pertinent negatives include no abdominal pain, chest pain, dysuria, fever or headaches.  Asthma She complains of shortness of breath. There is no cough. Pertinent negatives include no chest pain, ear pain, fever, headaches, myalgias or sore throat. Her past medical history is significant for asthma.  Depression        Associated symptoms include fatigue.  Associated symptoms include no myalgias and no headaches.   Past Medical History:  Diagnosis Date  . Anemia   . Aneurysm of other specified arteries ([place])   . Anxiety   . Arthritis   . Asthma   . Bronchiectasis without acute exacerbation ([place]) 01/03/2015   CT chest 12/2014:  Bronchiectasis throughout right lung, left clear.  +tree and bud, + patchy gg infiltrates in RLL Sputum 2016: no growth, AFB smear neg PFT's 02/2015:  Normal except mild decrease in DLCO   . Cataract    bilateral eye surgery  . Chronic chest pain    . Chronic idiopathic constipation   . Chronic kidney disease, stage 3   . Chronic venous hypertension (idiopathic) with other complications of bilateral lower extremity   . Depression   . Gastro-esophageal reflux   . Heart murmur   . Hyperlipemia   . Hypertension   . Idiopathic progressive neuropathy   . Iron (Fe) deficiency anemia   . Moderate protein-calorie malnutrition ([place])   . Other idiopathic scoliosis, lumbar region   . Pneumathemia ([place])   . Primary generalized (osteo)arthritis   . Primary insomnia   . Radiculopathy, lumbar region   . Scoliosis   . Secondary hyperparathyroidism ([place])   . Thyroid disease   . Unilateral primary osteoarthritis, left knee   . Unilateral primary osteoarthritis, right knee   . Varicose veins    Bilateral leg  . Vitamin D deficiency, unspecified     Past Surgical History:  Procedure Laterality Date  . ABDOMINAL HYSTERECTOMY    . CYSTOCELE REPAIR    . SALPINGOOPHORECTOMY      Family History  Problem Relation Age of Onset  . Emphysema Father   . Heart disease Mother   . Cancer Sister        metastatic lung cancer  . Heart disease Sister   . Hypertension Sister   . Varicose Veins Sister   . Hyperlipidemia Daughter   . Hypertension Daughter   . Diabetes Daughter   . Colon cancer Neg Hx   . Esophageal cancer Neg Hx   .  Stomach cancer Neg Hx     Social History   Socioeconomic History  . Marital status: Widowed    Spouse name: carl  . Number of children: 3  . Years of education: 9  . Highest education level: Not on file  Occupational History  . Occupation: retired  Tobacco Use  . Smoking status: Never Smoker  . Smokeless tobacco: Never Used  Substance and Sexual Activity  . Alcohol use: No    Alcohol/week: 0.0 standard drinks  . Drug use: No  . Sexual activity: Not Currently  Other Topics Concern  . Not on file  Social History Narrative   Right handed   Social Determinants of Health   Financial Resource Strain:    . Difficulty of Paying Living Expenses: Not on file  Food Insecurity:   . Worried About Programme researcher, broadcasting/film/video in the Last Year: Not on file  . Ran Out of Food in the Last Year: Not on file  Transportation Needs:   . Lack of Transportation (Medical): Not on file  . Lack of Transportation (Non-Medical): Not on file  Physical Activity:   . Days of Exercise per Week: Not on file  . Minutes of Exercise per Session: Not on file  Stress:   . Feeling of Stress : Not on file  Social Connections:   . Frequency of Communication with Friends and Family: Not on file  . Frequency of Social Gatherings with Friends and Family: Not on file  . Attends Religious Services: Not on file  . Active Member of Clubs or Organizations: Not on file  . Attends Banker Meetings: Not on file  . Marital Status: Not on file  Intimate Partner Violence:   . Fear of Current or Ex-Partner: Not on file  . Emotionally Abused: Not on file  . Physically Abused: Not on file  . Sexually Abused: Not on file    Outpatient Medications Prior to Visit  Medication Sig Dispense Refill  . Cholecalciferol (VITAMIN D3 PO) Take by mouth daily.    . Cyanocobalamin 2500 MCG TABS Take by mouth daily.    . diclofenac Sodium (VOLTAREN) 1 % GEL Apply topically 4 (four) times daily.    Marland Kitchen dicyclomine (BENTYL) 10 MG capsule Take 10 mg by mouth 4 (four) times daily -  before meals and at bedtime.    . Ferrous Sulfate (IRON PO) Take by mouth daily.    Marland Kitchen gabapentin (NEURONTIN) 100 MG capsule Take 100 mg by mouth 3 (three) times daily.  2  . hydrochlorothiazide (HYDRODIURIL) 25 MG tablet Take 25 mg by mouth daily.    Marland Kitchen HYDROcodone-acetaminophen (NORCO/VICODIN) 5-325 MG tablet Take 1 tablet by mouth every 6 (six) hours as needed for moderate pain.    Marland Kitchen levothyroxine (SYNTHROID, LEVOTHROID) 50 MCG tablet Take 50 mcg by mouth daily before breakfast.    . lidocaine (LIDODERM) 5 % Place 3 patches onto the skin every 12 (twelve) hours.     . meclizine (ANTIVERT) 12.5 MG tablet Take 12.5 mg by mouth every 8 (eight) hours as needed for dizziness.  1  . nitroGLYCERIN (NITROSTAT) 0.4 MG SL tablet Place 0.4 mg under the tongue every 5 (five) minutes as needed for chest pain. X 3 doses    . ondansetron (ZOFRAN ODT) 4 MG disintegrating tablet Take 1 tablet (4 mg total) by mouth every 8 (eight) hours. 60 tablet 1  . pantoprazole (PROTONIX) 40 MG tablet Take 40 mg by mouth 2 (two) times daily.    Marland Kitchen  polyethylene glycol (MIRALAX / GLYCOLAX) 17 g packet Take 17 g by mouth daily.    . potassium chloride SA (K-DUR,KLOR-CON) 20 MEQ tablet Take 40 mEq by mouth 3 (three) times daily.    Marland Kitchen torsemide (DEMADEX) 20 MG tablet Take 20 mg by mouth every 6 (six) hours.    . triamcinolone cream (KENALOG) 0.1 % Apply 1 application topically 2 (two) times daily as needed.     . venlafaxine XR (EFFEXOR-XR) 75 MG 24 hr capsule Take 75 mg by mouth every morning.    Marland Kitchen ALPRAZolam (XANAX) 0.5 MG tablet Take 0.5 mg by mouth 2 (two) times daily as needed for anxiety.    . fluticasone (FLONASE) 50 MCG/ACT nasal spray Place 2 sprays into both nostrils as needed.     . pantoprazole (PROTONIX) 40 MG tablet Take 1 tablet (40 mg total) by mouth 2 (two) times daily. Please schedule an OV for further refills: [phone] (Patient taking differently: Take 40 mg by mouth 2 (two) times daily. ) 60 tablet 0  . Tiotropium Bromide-Olodaterol (STIOLTO RESPIMAT) 2.5-2.5 MCG/ACT AERS Inhale 2 puffs into the lungs daily.    Marland Kitchen atenolol-chlorthalidone (TENORETIC) 100-25 MG tablet Take 1 tablet by mouth daily.    . furosemide (LASIX) 20 MG tablet Take 2 tablets by mouth twice a day for 2 days then go down to 1 tablet by mouth twice a day 90 tablet 3  . hydrOXYzine (ATARAX/VISTARIL) 25 MG tablet Take 25 mg by mouth 3 (three) times daily as needed.    Marland Kitchen albuterol (PROVENTIL HFA;VENTOLIN HFA) 108 (90 Base) MCG/ACT inhaler Inhale 2 puffs into the lungs every 6 (six) hours as needed for wheezing  or shortness of breath.    . cyanocobalamin 500 MCG tablet Take 500 mcg by mouth daily.    . ondansetron (ZOFRAN) 4 MG tablet Take 4 mg by mouth every 8 (eight) hours as needed for nausea or vomiting.    . simvastatin (ZOCOR) 40 MG tablet Take 40 mg by mouth daily.     Marland Kitchen STIOLTO RESPIMAT 2.5-2.5 MCG/ACT AERS     . 0.9 %  sodium chloride infusion      No facility-administered medications prior to visit.    Allergies  Allergen Reactions  . Lisinopril Other (See Comments)  . Remeron [Mirtazapine] Other (See Comments)  . Ciprofloxacin     unknown  . Levofloxacin     unknonw  . Nitrofurantoin     unknown  . Omnicef [Cefdinir] Diarrhea  . Sertraline Hcl Other (See Comments)    Tremors    ROS Review of Systems  Constitutional: Positive for fatigue. Negative for chills and fever.  HENT: Positive for congestion. Negative for ear pain and sore throat.   Respiratory: Positive for shortness of breath. Negative for cough.        Once in a while after exertion. Resolves with rest.   Cardiovascular: Negative for chest pain.  Gastrointestinal: Positive for constipation. Negative for abdominal pain, diarrhea, nausea and vomiting.       Miralax helps, but often has to take an otc medicine.  Endocrine: Negative for polydipsia, polyphagia and polyuria.  Genitourinary: Negative for dysuria and urgency.  Musculoskeletal: Positive for back pain. Negative for arthralgias and myalgias.       Back pain improving.  Neurological: Positive for dizziness. Negative for headaches.       Positional with standing up.   Psychiatric/Behavioral: Positive for depression. Negative for dysphoric mood and sleep disturbance. The patient is not nervous/anxious.  Objective:    Physical Exam  Pulse 98   Ht 4\' 11"  (1.499 m)   Wt 73 lb 9.6 oz (33.4 kg)   SpO2 96%   BMI 14.87 kg/m  Wt Readings from Last 3 Encounters:  11/26/19 73 lb 9.6 oz (33.4 kg)  03/25/18 81 lb (36.7 kg)  03/13/18 81 lb 2 oz (36.8  kg)     There are no preventive care reminders to display for this patient.  There are no preventive care reminders to display for this patient.  No results found for: TSH Lab Results  Component Value Date   WBC 9.8 12/07/2019   HGB 10.2 (L) 12/07/2019   HCT 30.9 (L) 12/07/2019   MCV 88 12/07/2019   PLT 310 12/07/2019   Lab Results  Component Value Date   NA 144 12/07/2019   K 4.5 12/07/2019   CO2 35 (H) 04/07/2018   GLUCOSE 87 12/07/2019   BUN 26 12/07/2019   CREATININE 0.98 12/07/2019   BILITOT 0.3 12/07/2019   ALKPHOS 85 12/07/2019   AST 21 12/07/2019   ALT 10 03/19/2017   PROT 7.4 12/07/2019   ALBUMIN 4.0 12/07/2019   CALCIUM 10.0 12/07/2019   ANIONGAP 10 07/07/2016   GFR 48.24 (L) 04/07/2018   No results found for: CHOL No results found for: HDL No results found for: LDLCALC No results found for: TRIG No results found for: CHOLHDL Lab Results  Component Value Date   HGBA1C 5.6 07/01/2016      Assessment & Plan:   Problem List Items Addressed This Visit      Endocrine   Hypothyroidism - Primary    Continue current medications. Ordered TSH.         Other   Localized edema   Other fatigue    Check labs.       Relevant Orders   CBC with Differential/Platelet (Completed)   Comp. Metabolic Panel (12) (Completed)   TSH   VITAMIN D 25 Hydroxy (Vit-D Deficiency, Fractures) (Completed)   Chronic bilateral low back pain without sciatica    Continue current medications. Fairly well controlled.      Mild vitamin D deficiency    Check vitamin D level.          Meds ordered this encounter  Medications  . mometasone (ASMANEX, 60 METERED DOSES,) 220 MCG/INH inhaler    Sig: Inhale 2 puffs into the lungs daily.    Dispense:  1 Inhaler    Refill:  12  . fluticasone (FLONASE) 50 MCG/ACT nasal spray    Sig: Place 2 sprays into both nostrils daily.    Dispense:  1 g    Refill:  5    Follow-up: Return in about 3 months (around 02/23/2020) for  chronic follow up. 04/24/2020, MD

## 2019-11-29 DIAGNOSIS — G8929 Other chronic pain: Secondary | ICD-10-CM

## 2019-11-29 DIAGNOSIS — E559 Vitamin D deficiency, unspecified: Secondary | ICD-10-CM | POA: Insufficient documentation

## 2019-11-29 DIAGNOSIS — M545 Low back pain, unspecified: Secondary | ICD-10-CM

## 2019-11-29 HISTORY — DX: Other chronic pain: G89.29

## 2019-11-29 HISTORY — DX: Vitamin D deficiency, unspecified: E55.9

## 2019-11-29 HISTORY — DX: Low back pain, unspecified: M54.50

## 2019-11-29 NOTE — Assessment & Plan Note (Signed)
Check labs 

## 2019-11-29 NOTE — Assessment & Plan Note (Signed)
Check vitamin D level 

## 2019-11-29 NOTE — Assessment & Plan Note (Signed)
Continue current medications.  Fairly well controlled. 

## 2019-11-29 NOTE — Assessment & Plan Note (Signed)
Continue current medications. Ordered TSH.

## 2019-12-01 ENCOUNTER — Telehealth: Payer: Self-pay

## 2019-12-01 NOTE — Telephone Encounter (Signed)
Called patient per Dr. Sedalia Muta request in regards to rather or not she is taking/using Asmanex or Stiolto inhaler. Per patient she is taking both, however Asmanex will no longer be covered by her insurance. Per Dr. Sedalia Muta patient is to try to only use the Stilto inhaler and to call back if she feels it is not helping as much. Patient was advised to contact her insurance company to see what the alternatives are for Asmanex. Patient expressed understanding.

## 2019-12-02 ENCOUNTER — Other Ambulatory Visit: Payer: Medicare Other

## 2019-12-04 ENCOUNTER — Other Ambulatory Visit: Payer: Self-pay | Admitting: Family Medicine

## 2019-12-07 ENCOUNTER — Other Ambulatory Visit: Payer: Medicare Other

## 2019-12-07 ENCOUNTER — Other Ambulatory Visit: Payer: Self-pay

## 2019-12-07 DIAGNOSIS — R5383 Other fatigue: Secondary | ICD-10-CM

## 2019-12-08 LAB — CBC WITH DIFFERENTIAL/PLATELET
Basophils Absolute: 0.1 10*3/uL (ref 0.0–0.2)
Basos: 1 %
EOS (ABSOLUTE): 0.6 10*3/uL — ABNORMAL HIGH (ref 0.0–0.4)
Eos: 6 %
Hematocrit: 30.9 % — ABNORMAL LOW (ref 34.0–46.6)
Hemoglobin: 10.2 g/dL — ABNORMAL LOW (ref 11.1–15.9)
Immature Grans (Abs): 0 10*3/uL (ref 0.0–0.1)
Immature Granulocytes: 0 %
Lymphocytes Absolute: 2.7 10*3/uL (ref 0.7–3.1)
Lymphs: 27 %
MCH: 28.9 pg (ref 26.6–33.0)
MCHC: 33 g/dL (ref 31.5–35.7)
MCV: 88 fL (ref 79–97)
Monocytes Absolute: 1 10*3/uL — ABNORMAL HIGH (ref 0.1–0.9)
Monocytes: 10 %
Neutrophils Absolute: 5.4 10*3/uL (ref 1.4–7.0)
Neutrophils: 56 %
Platelets: 310 10*3/uL (ref 150–450)
RBC: 3.53 x10E6/uL — ABNORMAL LOW (ref 3.77–5.28)
RDW: 13 % (ref 11.7–15.4)
WBC: 9.8 10*3/uL (ref 3.4–10.8)

## 2019-12-08 LAB — COMP. METABOLIC PANEL (12)
AST: 21 IU/L (ref 0–40)
Albumin/Globulin Ratio: 1.2 (ref 1.2–2.2)
Albumin: 4 g/dL (ref 3.6–4.6)
Alkaline Phosphatase: 85 IU/L (ref 39–117)
BUN/Creatinine Ratio: 27 (ref 12–28)
BUN: 26 mg/dL (ref 8–27)
Bilirubin Total: 0.3 mg/dL (ref 0.0–1.2)
Calcium: 10 mg/dL (ref 8.7–10.3)
Chloride: 104 mmol/L (ref 96–106)
Creatinine, Ser: 0.98 mg/dL (ref 0.57–1.00)
GFR calc Af Amer: 61 mL/min/{1.73_m2} (ref >59)
GFR calc non Af Amer: 53 mL/min/{1.73_m2} — ABNORMAL LOW (ref >59)
Globulin, Total: 3.4 g/dL (ref 1.5–4.5)
Glucose: 87 mg/dL (ref 65–99)
Potassium: 4.5 mmol/L (ref 3.5–5.2)
Sodium: 144 mmol/L (ref 134–144)
Total Protein: 7.4 g/dL (ref 6.0–8.5)

## 2019-12-08 LAB — VITAMIN D 25 HYDROXY (VIT D DEFICIENCY, FRACTURES): Vit D, 25-Hydroxy: 67.2 ng/mL (ref 30.0–100.0)

## 2019-12-09 ENCOUNTER — Other Ambulatory Visit: Payer: Self-pay | Admitting: Physician Assistant

## 2019-12-09 ENCOUNTER — Other Ambulatory Visit: Payer: Self-pay

## 2019-12-09 DIAGNOSIS — R799 Abnormal finding of blood chemistry, unspecified: Secondary | ICD-10-CM

## 2019-12-09 MED ORDER — TIOTROPIUM BROMIDE-OLODATEROL 2.5-2.5 MCG/ACT IN AERS: 2.0000 | INHALATION_SPRAY | Freq: Two times a day (BID) | RESPIRATORY_TRACT | 2 refills | Status: DC

## 2019-12-10 ENCOUNTER — Telehealth: Payer: Self-pay

## 2019-12-10 NOTE — Telephone Encounter (Signed)
PA submitted via covermymeds for Asmanex 220 mcg 60 dose twist inhaler inhaler. Approved through 10/21/2020. Patient aware.

## 2019-12-17 ENCOUNTER — Other Ambulatory Visit: Payer: Self-pay | Admitting: Family Medicine

## 2019-12-18 ENCOUNTER — Other Ambulatory Visit: Payer: Self-pay | Admitting: Family Medicine

## 2020-01-06 ENCOUNTER — Other Ambulatory Visit: Payer: Medicare Other

## 2020-01-07 ENCOUNTER — Other Ambulatory Visit: Payer: Medicare Other

## 2020-01-07 ENCOUNTER — Other Ambulatory Visit: Payer: Self-pay

## 2020-01-07 DIAGNOSIS — D649 Anemia, unspecified: Secondary | ICD-10-CM

## 2020-01-07 HISTORY — DX: Anemia, unspecified: D64.9

## 2020-01-08 LAB — CBC WITH DIFFERENTIAL/PLATELET
Basophils Absolute: 0.1 10*3/uL (ref 0.0–0.2)
Basos: 1 %
EOS (ABSOLUTE): 0.4 10*3/uL (ref 0.0–0.4)
Eos: 4 %
Hematocrit: 30.6 % — ABNORMAL LOW (ref 34.0–46.6)
Hemoglobin: 9.6 g/dL — ABNORMAL LOW (ref 11.1–15.9)
Immature Grans (Abs): 0 10*3/uL (ref 0.0–0.1)
Immature Granulocytes: 0 %
Lymphocytes Absolute: 2.6 10*3/uL (ref 0.7–3.1)
Lymphs: 24 %
MCH: 27.1 pg (ref 26.6–33.0)
MCHC: 31.4 g/dL — ABNORMAL LOW (ref 31.5–35.7)
MCV: 86 fL (ref 79–97)
Monocytes Absolute: 1.1 10*3/uL — ABNORMAL HIGH (ref 0.1–0.9)
Monocytes: 10 %
Neutrophils Absolute: 6.5 10*3/uL (ref 1.4–7.0)
Neutrophils: 61 %
Platelets: 328 10*3/uL (ref 150–450)
RBC: 3.54 x10E6/uL — ABNORMAL LOW (ref 3.77–5.28)
RDW: 12.8 % (ref 11.7–15.4)
WBC: 10.7 10*3/uL (ref 3.4–10.8)

## 2020-01-08 LAB — B12 AND FOLATE PANEL
Folate: >20 ng/mL (ref >3.0)
Vitamin B-12: >2000 pg/mL — ABNORMAL HIGH (ref 232–1245)

## 2020-01-18 DIAGNOSIS — D649 Anemia, unspecified: Secondary | ICD-10-CM

## 2020-01-18 DIAGNOSIS — E538 Deficiency of other specified B group vitamins: Secondary | ICD-10-CM | POA: Diagnosis not present

## 2020-01-19 ENCOUNTER — Telehealth: Payer: Self-pay

## 2020-01-19 NOTE — Telephone Encounter (Signed)
Per [facility], [name] had an appointment scheduled for 01/18/2020 at 10:30am.

## 2020-01-28 DIAGNOSIS — D631 Anemia in chronic kidney disease: Secondary | ICD-10-CM | POA: Diagnosis not present

## 2020-01-28 DIAGNOSIS — D509 Iron deficiency anemia, unspecified: Secondary | ICD-10-CM | POA: Diagnosis not present

## 2020-01-28 DIAGNOSIS — N189 Chronic kidney disease, unspecified: Secondary | ICD-10-CM | POA: Diagnosis not present

## 2020-01-29 ENCOUNTER — Telehealth: Payer: Self-pay

## 2020-01-29 ENCOUNTER — Other Ambulatory Visit: Payer: Self-pay | Admitting: Family Medicine

## 2020-01-29 NOTE — Telephone Encounter (Signed)
Stephanie Clay's daughter called to report that her Mother is having issues with constipation.  She has been taking a stool softener but that does not seem to be helping.  She was instructed to try miralax otc and call us back if no improvement.

## 2020-01-30 ENCOUNTER — Other Ambulatory Visit: Payer: Self-pay | Admitting: Family Medicine

## 2020-02-01 ENCOUNTER — Other Ambulatory Visit: Payer: Self-pay | Admitting: Family Medicine

## 2020-02-05 ENCOUNTER — Other Ambulatory Visit: Payer: Self-pay | Admitting: Family Medicine

## 2020-02-05 DIAGNOSIS — D631 Anemia in chronic kidney disease: Secondary | ICD-10-CM | POA: Diagnosis not present

## 2020-02-05 DIAGNOSIS — N189 Chronic kidney disease, unspecified: Secondary | ICD-10-CM | POA: Diagnosis not present

## 2020-02-05 DIAGNOSIS — D509 Iron deficiency anemia, unspecified: Secondary | ICD-10-CM | POA: Diagnosis not present

## 2020-02-10 ENCOUNTER — Other Ambulatory Visit: Payer: Self-pay

## 2020-02-10 MED ORDER — HYDROCODONE-ACETAMINOPHEN 5-325 MG PO TABS: 1.0000 | ORAL_TABLET | Freq: Four times a day (QID) | ORAL | 0 refills | Status: DC | PRN

## 2020-02-11 ENCOUNTER — Other Ambulatory Visit: Payer: Self-pay | Admitting: Family Medicine

## 2020-03-01 ENCOUNTER — Other Ambulatory Visit: Payer: Self-pay | Admitting: Physician Assistant

## 2020-03-04 NOTE — Progress Notes (Signed)
Subjective:  Patient ID: Stephanie Clay, female    DOB: [birth year]  Age: 86 y.o. MRN: [id number]  Chief Complaint  Patient presents with  . Hyperlipidemia  . Hypertension    HPI [name] the presents for follow-up of her chronic medical problems which include hyperlipidemia, hypertension, chronic back pain, chronic leg pain, primary insomnia and depression.  In terms of her hyperlipidemia I discontinued her longtime statin within the last 6 months due to her chronic muscular pain in addition to her age and the changing the recommendations for statin use.  I have not rechecked her cholesterol since discontinuation.  In terms of high blood pressure she is currently on hydrochlorothiazide 25 mg once daily which truly is being used more for her edema.  This is dosed 6 hours prior to her receiving her Demadex in the hopes of improving her swelling.  This has helped but she continues to have chronic leg pain.  She has significant varicosities and has seen the vascular doctors in [place].  Recently she had a [place] [facility].  Nurse practitioner come to her home and did an evaluation of her lower extremities for peripheral vascular disease and she was told her left leg was abnormal.  She was recommended to come to me and I would order a more thorough test.  Patient complains of diffuse abdominal pain.  She gives pain relief when she uses MiraLAX 1-2 times per day.  She denies being gassy.  Eating does not seem to affect the abdominal pain.  It does not radiate to her back.  She just states that it hurts all the time.  The patient also complains of back pain along her entire spine, rib pain, hands hurting and knee pain left greater than right.  She is unable to write her name any longer.   Past Medical History:  Diagnosis Date  . Anemia   . Aneurysm of other specified arteries (HCC)   . Anxiety   . Arthritis   . Asthma   . Bronchiectasis without acute exacerbation (HCC) 01/03/2015   CT  chest 12/2014:  Bronchiectasis throughout right lung, left clear.  +tree and bud, + patchy gg infiltrates in RLL Sputum 2016: no growth, AFB smear neg PFT's 02/2015:  Normal except mild decrease in DLCO   . Cataract    bilateral eye surgery  . Chronic chest pain   . Chronic idiopathic constipation   . Chronic kidney disease, stage 3   . Chronic venous hypertension (idiopathic) with other complications of bilateral lower extremity   . Depression   . Gastro-esophageal reflux   . Heart murmur   . Hyperlipemia   . Hypertension   . Idiopathic progressive neuropathy   . Iron (Fe) deficiency anemia   . Moderate protein-calorie malnutrition (HCC)   . Other idiopathic scoliosis, lumbar region   . Pneumathemia (HCC)   . Primary generalized (osteo)arthritis   . Primary insomnia   . Radiculopathy, lumbar region   . Scoliosis   . Secondary hyperparathyroidism (HCC)   . Thyroid disease   . Unilateral primary osteoarthritis, left knee   . Unilateral primary osteoarthritis, right knee   . Varicose veins    Bilateral leg  . Vitamin D deficiency, unspecified    Past Surgical History:  Procedure Laterality Date  . ABDOMINAL HYSTERECTOMY    . CYSTOCELE REPAIR    . SALPINGOOPHORECTOMY      Family History  Problem Relation Age of Onset  . Emphysema Father   . Heart  disease Mother   . Cancer Sister        metastatic lung cancer  . Heart disease Sister   . Hypertension Sister   . Varicose Veins Sister   . Hyperlipidemia Daughter   . Hypertension Daughter   . Diabetes Daughter   . Colon cancer Neg Hx   . Esophageal cancer Neg Hx   . Stomach cancer Neg Hx    Social History   Socioeconomic History  . Marital status: Married    Spouse name: carl  . Number of children: 3  . Years of education: 9  . Highest education level: Not on file  Occupational History  . Occupation: retired  Tobacco Use  . Smoking status: Never Smoker  . Smokeless tobacco: Never Used  Substance and Sexual  Activity  . Alcohol use: No    Alcohol/week: 0.0 standard drinks  . Drug use: No  . Sexual activity: Not Currently  Other Topics Concern  . Not on file  Social History Narrative   Right handed   Social Determinants of Health   Financial Resource Strain:   . Difficulty of Paying Living Expenses:   Food Insecurity:   . Worried About Charity fundraiser in the Last Year:   . Arboriculturist in the Last Year:   Transportation Needs:   . Film/video editor (Medical):   Marland Kitchen Lack of Transportation (Non-Medical):   Physical Activity:   . Days of Exercise per Week:   . Minutes of Exercise per Session:   Stress:   . Feeling of Stress :   Social Connections:   . Frequency of Communication with Friends and Family:   . Frequency of Social Gatherings with Friends and Family:   . Attends Religious Services:   . Active Member of Clubs or Organizations:   . Attends Archivist Meetings:   Marland Kitchen Marital Status:     Review of Systems  Constitutional: Negative for chills, fatigue and fever.  HENT: Positive for sneezing. Negative for congestion, ear pain, rhinorrhea and sore throat.   Respiratory: Positive for cough. Negative for shortness of breath.   Cardiovascular: Negative for chest pain.  Gastrointestinal: Positive for constipation. Negative for abdominal pain, diarrhea, nausea and vomiting.  Genitourinary: Negative for dysuria and urgency.  Musculoskeletal: Positive for arthralgias and myalgias. Negative for back pain.  Allergic/Immunologic: Positive for environmental allergies.  Neurological: Positive for dizziness and headaches. Negative for weakness and light-headedness.  Psychiatric/Behavioral: Positive for dysphoric mood. The patient is not nervous/anxious.      Objective:  BP 130/70   Pulse 67   Temp (!) 97.3 F (36.3 C)   Ht 4\' 11"  (1.499 m)   Wt 74 lb 12.8 oz (33.9 kg)   SpO2 93%   BMI 15.11 kg/m   BP/Weight 03/08/2020 1/0/2585 11/28/7822  Systolic BP 235 - 98   Diastolic BP 70 - 62  Wt. (Lbs) 74.8 73.6 81  BMI 15.11 14.87 16.36    Physical Exam Vitals reviewed.  Constitutional:      Appearance: Normal appearance. She is normal weight.  Cardiovascular:     Rate and Rhythm: Normal rate and regular rhythm.     Heart sounds: Normal heart sounds.     Comments: Pulses palpable bilaterally over dorsalis pedis. Pulmonary:     Effort: Pulmonary effort is normal. No respiratory distress.     Breath sounds: Normal breath sounds.  Abdominal:     General: Abdomen is flat. Bowel sounds are normal.  Palpations: Abdomen is soft.     Tenderness: There is no abdominal tenderness.  Musculoskeletal:        General: Tenderness (Predominantly over lumbar spine and lumbar paraspinal muscles..  And is nontender.) present.  Neurological:     Mental Status: She is alert and oriented to person, place, and time.  Psychiatric:        Mood and Affect: Mood normal.        Behavior: Behavior normal.     Diabetic Foot Exam - Simple   No data filed       Lab Results  Component Value Date   WBC 10.4 03/08/2020   HGB 10.9 (L) 03/08/2020   HCT 33.7 (L) 03/08/2020   PLT 281 03/08/2020   GLUCOSE 77 03/08/2020   CHOL 194 03/08/2020   TRIG 90 03/08/2020   HDL 70 03/08/2020   LDLCALC 108 (H) 03/08/2020   ALT 12 03/08/2020   AST 22 03/08/2020   NA 144 03/08/2020   K 4.3 03/08/2020   CL 100 03/08/2020   CREATININE 1.11 (H) 03/08/2020   BUN 35 (H) 03/08/2020   CO2 29 03/08/2020   HGBA1C 5.5 03/08/2020      Assessment & Plan:  1. Essential hypertension Well controlled.  No changes to medicines.  Continue to work on eating a healthy diet and exercise.  Labs drawn today.  - Comprehensive metabolic panel  2. Hyperglycemia Well controlled.  Continue to work on eating a healthy diet and exercise.  Labs drawn today.  - CBC with Differential/Platelet - Hemoglobin A1c  3. Mixed hyperlipidemia Well controlled.  No changes to medicines.  Continue  to work on eating a healthy diet and exercise.  Labs drawn today.  - Lipid panel  4. Pain in other joint - pt clearly has OA, but am concerned with such diffuse pain she may have systemic arthritis  Also. - Arthritis Panel - abnormal.  - Refer to [facility].    5. Decreased pedal pulses - VAS [place] ABI WITH/WO TBI; Future   6. Severe protein malnutrition Recommend continue protein drinks. Eat 3 meals per day.  Meds ordered this encounter  Medications  . HYDROcodone-acetaminophen (NORCO/VICODIN) 5-325 MG tablet    Sig: Take 1 tablet by mouth in the morning, at noon, and at bedtime.    Dispense:  90 tablet    Refill:  0    Orders Placed This Encounter  Procedures  . CBC with Differential/Platelet  . Lipid panel  . Comprehensive metabolic panel  . Hemoglobin A1c  . Arthritis Panel  . Cardiovascular Risk Assessment  . VAS [place] ABI WITH/WO TBI     Follow-up: Return in about 3 months (around 06/08/2020) for fasting.  An After Visit Summary was printed and given to the patient.  Blane Ohara [facility] [phone]

## 2020-03-08 ENCOUNTER — Other Ambulatory Visit: Payer: Self-pay

## 2020-03-08 ENCOUNTER — Ambulatory Visit (INDEPENDENT_AMBULATORY_CARE_PROVIDER_SITE_OTHER): Payer: Medicare Other | Admitting: Family Medicine

## 2020-03-08 ENCOUNTER — Encounter: Payer: Self-pay | Admitting: Family Medicine

## 2020-03-08 VITALS — BP 130/70 | HR 67 | Temp 97.3°F | Ht 59.0 in | Wt 74.8 lb

## 2020-03-08 DIAGNOSIS — M2559 Pain in other specified joint: Secondary | ICD-10-CM

## 2020-03-08 DIAGNOSIS — E782 Mixed hyperlipidemia: Secondary | ICD-10-CM

## 2020-03-08 DIAGNOSIS — R0989 Other specified symptoms and signs involving the circulatory and respiratory systems: Secondary | ICD-10-CM | POA: Diagnosis not present

## 2020-03-08 DIAGNOSIS — R739 Hyperglycemia, unspecified: Secondary | ICD-10-CM

## 2020-03-08 DIAGNOSIS — I10 Essential (primary) hypertension: Secondary | ICD-10-CM | POA: Diagnosis not present

## 2020-03-08 DIAGNOSIS — E43 Unspecified severe protein-calorie malnutrition: Secondary | ICD-10-CM

## 2020-03-08 MED ORDER — HYDROCODONE-ACETAMINOPHEN 5-325 MG PO TABS: 1.0000 | ORAL_TABLET | Freq: Three times a day (TID) | ORAL | 0 refills | Status: DC

## 2020-03-08 NOTE — Patient Instructions (Addendum)
Hydrocodone 5/325 mg increased to 3 times daily as needed for joint pain/back pain. Ordering vascular studies to evaluate for peripheral vascular disease. Ordering arthritis panel. Recommend patient take MiraLAX 1 dose in a.m. and p.m. to help with constipation which is the most likely source of her generalized abdominal pain.

## 2020-03-09 LAB — COMPREHENSIVE METABOLIC PANEL
ALT: 12 IU/L (ref 0–32)
AST: 22 IU/L (ref 0–40)
Albumin/Globulin Ratio: 1.1 — ABNORMAL LOW (ref 1.2–2.2)
Albumin: 4 g/dL (ref 3.6–4.6)
Alkaline Phosphatase: 84 IU/L (ref 48–121)
BUN/Creatinine Ratio: 32 — ABNORMAL HIGH (ref 12–28)
BUN: 35 mg/dL — ABNORMAL HIGH (ref 8–27)
Bilirubin Total: 0.2 mg/dL (ref 0.0–1.2)
CO2: 29 mmol/L (ref 20–29)
Calcium: 10 mg/dL (ref 8.7–10.3)
Chloride: 100 mmol/L (ref 96–106)
Creatinine, Ser: 1.11 mg/dL — ABNORMAL HIGH (ref 0.57–1.00)
GFR calc Af Amer: 52 mL/min/{1.73_m2} — ABNORMAL LOW (ref >59)
GFR calc non Af Amer: 45 mL/min/{1.73_m2} — ABNORMAL LOW (ref >59)
Globulin, Total: 3.6 g/dL (ref 1.5–4.5)
Glucose: 77 mg/dL (ref 65–99)
Potassium: 4.3 mmol/L (ref 3.5–5.2)
Sodium: 144 mmol/L (ref 134–144)
Total Protein: 7.6 g/dL (ref 6.0–8.5)

## 2020-03-09 LAB — CARDIOVASCULAR RISK ASSESSMENT

## 2020-03-09 LAB — LIPID PANEL
Chol/HDL Ratio: 2.8 ratio (ref 0.0–4.4)
Cholesterol, Total: 194 mg/dL (ref 100–199)
HDL: 70 mg/dL (ref >39)
LDL Chol Calc (NIH): 108 mg/dL — ABNORMAL HIGH (ref 0–99)
Triglycerides: 90 mg/dL (ref 0–149)
VLDL Cholesterol Cal: 16 mg/dL (ref 5–40)

## 2020-03-09 LAB — CBC WITH DIFFERENTIAL/PLATELET
Basophils Absolute: 0.1 10*3/uL (ref 0.0–0.2)
Basos: 1 %
EOS (ABSOLUTE): 0.5 10*3/uL — ABNORMAL HIGH (ref 0.0–0.4)
Eos: 5 %
Hematocrit: 33.7 % — ABNORMAL LOW (ref 34.0–46.6)
Hemoglobin: 10.9 g/dL — ABNORMAL LOW (ref 11.1–15.9)
Immature Grans (Abs): 0 10*3/uL (ref 0.0–0.1)
Immature Granulocytes: 0 %
Lymphocytes Absolute: 2.1 10*3/uL (ref 0.7–3.1)
Lymphs: 20 %
MCH: 29.3 pg (ref 26.6–33.0)
MCHC: 32.3 g/dL (ref 31.5–35.7)
MCV: 91 fL (ref 79–97)
Monocytes Absolute: 0.9 10*3/uL (ref 0.1–0.9)
Monocytes: 9 %
Neutrophils Absolute: 6.8 10*3/uL (ref 1.4–7.0)
Neutrophils: 65 %
Platelets: 281 10*3/uL (ref 150–450)
RBC: 3.72 x10E6/uL — ABNORMAL LOW (ref 3.77–5.28)
RDW: 16.3 % — ABNORMAL HIGH (ref 11.7–15.4)
WBC: 10.4 10*3/uL (ref 3.4–10.8)

## 2020-03-09 LAB — HEMOGLOBIN A1C
Est. average glucose Bld gHb Est-mCnc: 111 mg/dL
Hgb A1c MFr Bld: 5.5 % (ref 4.8–5.6)

## 2020-03-09 LAB — ARTHRITIS PANEL
Anti Nuclear Antibody (ANA): POSITIVE — AB
Rheumatoid fact SerPl-aCnc: 14.6 IU/mL — ABNORMAL HIGH (ref 0.0–13.9)
Sed Rate: 33 mm/hr (ref 0–40)
Uric Acid: 7.2 mg/dL (ref 3.1–7.9)

## 2020-03-14 ENCOUNTER — Other Ambulatory Visit: Payer: Self-pay

## 2020-03-14 DIAGNOSIS — M2559 Pain in other specified joint: Secondary | ICD-10-CM

## 2020-03-14 NOTE — Progress Notes (Signed)
R

## 2020-03-24 DIAGNOSIS — R0989 Other specified symptoms and signs involving the circulatory and respiratory systems: Secondary | ICD-10-CM

## 2020-03-24 DIAGNOSIS — M2550 Pain in unspecified joint: Secondary | ICD-10-CM | POA: Insufficient documentation

## 2020-03-24 DIAGNOSIS — E43 Unspecified severe protein-calorie malnutrition: Secondary | ICD-10-CM | POA: Insufficient documentation

## 2020-03-24 HISTORY — DX: Unspecified severe protein-calorie malnutrition: E43

## 2020-03-24 HISTORY — DX: Other specified symptoms and signs involving the circulatory and respiratory systems: R09.89

## 2020-03-24 HISTORY — DX: Pain in unspecified joint: M25.50

## 2020-03-28 ENCOUNTER — Other Ambulatory Visit: Payer: Self-pay | Admitting: Physician Assistant

## 2020-03-28 ENCOUNTER — Other Ambulatory Visit: Payer: Self-pay | Admitting: Family Medicine

## 2020-04-01 ENCOUNTER — Other Ambulatory Visit: Payer: Self-pay | Admitting: Physician Assistant

## 2020-04-29 ENCOUNTER — Other Ambulatory Visit: Payer: Self-pay | Admitting: Family Medicine

## 2020-05-02 ENCOUNTER — Other Ambulatory Visit: Payer: Self-pay | Admitting: Family Medicine

## 2020-05-06 ENCOUNTER — Other Ambulatory Visit: Payer: Self-pay | Admitting: Family Medicine

## 2020-05-16 ENCOUNTER — Other Ambulatory Visit: Payer: Self-pay | Admitting: Family Medicine

## 2020-06-06 ENCOUNTER — Other Ambulatory Visit: Payer: Self-pay | Admitting: Family Medicine

## 2020-06-06 ENCOUNTER — Other Ambulatory Visit: Payer: Self-pay | Admitting: Physician Assistant

## 2020-06-15 ENCOUNTER — Ambulatory Visit (INDEPENDENT_AMBULATORY_CARE_PROVIDER_SITE_OTHER): Payer: Medicare Other | Admitting: Family Medicine

## 2020-06-15 ENCOUNTER — Other Ambulatory Visit: Payer: Self-pay

## 2020-06-15 VITALS — BP 120/64 | HR 88 | Temp 97.6°F | Resp 14 | Ht 59.0 in | Wt 75.6 lb

## 2020-06-15 DIAGNOSIS — R5383 Other fatigue: Secondary | ICD-10-CM

## 2020-06-15 DIAGNOSIS — I10 Essential (primary) hypertension: Secondary | ICD-10-CM

## 2020-06-15 DIAGNOSIS — N3000 Acute cystitis without hematuria: Secondary | ICD-10-CM | POA: Diagnosis not present

## 2020-06-15 DIAGNOSIS — M545 Low back pain, unspecified: Secondary | ICD-10-CM

## 2020-06-15 DIAGNOSIS — K5901 Slow transit constipation: Secondary | ICD-10-CM

## 2020-06-15 DIAGNOSIS — F331 Major depressive disorder, recurrent, moderate: Secondary | ICD-10-CM

## 2020-06-15 DIAGNOSIS — M79604 Pain in right leg: Secondary | ICD-10-CM

## 2020-06-15 DIAGNOSIS — J449 Chronic obstructive pulmonary disease, unspecified: Secondary | ICD-10-CM | POA: Diagnosis not present

## 2020-06-15 DIAGNOSIS — M79605 Pain in left leg: Secondary | ICD-10-CM

## 2020-06-15 LAB — POCT URINALYSIS DIPSTICK
Bilirubin, UA: NEGATIVE
Blood, UA: NEGATIVE
Glucose, UA: NEGATIVE
Ketones, UA: NEGATIVE
Nitrite, UA: NEGATIVE
Spec Grav, UA: <=1.005 — AB (ref 1.010–1.025)
Urobilinogen, UA: 0.2 E.U./dL
pH, UA: 7 (ref 5.0–8.0)

## 2020-06-15 MED ORDER — TRELEGY ELLIPTA 100-62.5-25 MCG/INH IN AEPB: 1.0000 | INHALATION_SPRAY | Freq: Every day | RESPIRATORY_TRACT | 2 refills | Status: DC

## 2020-06-15 MED ORDER — GABAPENTIN 300 MG PO CAPS: 300.0000 mg | ORAL_CAPSULE | Freq: Three times a day (TID) | ORAL | 3 refills | Status: DC

## 2020-06-15 MED ORDER — LINACLOTIDE 72 MCG PO CAPS: 72.0000 ug | ORAL_CAPSULE | Freq: Every day | ORAL | 2 refills | Status: DC

## 2020-06-15 MED ORDER — VENLAFAXINE HCL ER 150 MG PO CP24: 150.0000 mg | ORAL_CAPSULE | Freq: Every day | ORAL | 2 refills | Status: DC

## 2020-06-15 MED ORDER — SULFAMETHOXAZOLE-TRIMETHOPRIM 800-160 MG PO TABS: 1.0000 | ORAL_TABLET | Freq: Two times a day (BID) | ORAL | 0 refills | Status: DC

## 2020-06-15 NOTE — Progress Notes (Signed)
Subjective:  Patient ID: Stephanie Clay, female    DOB: 10-Apr-1932  Age: 86 y.o. MRN: [id number]  Chief Complaint  Patient presents with  . Hyperlipidemia  . Edema    HPI COPD: Patient is having issues utilizing Asmanex due to her arthritis in her hands.  Her breathing and coughing her baseline.  She does still feel short of breath at times.  Depression: Patient continues to suffer from depression.  Truly I have tried this patient on nearly every antidepressant available.  She has done the best on venlafaxine ER which she is currently taking 75 mg daily.  Patient continues to complain of low back and leg pain.  Although the hydrocodone does help as well as the gabapentin is not strong enough.  Patient has a history of hyperlipidemia.  This is been well controlled on a low-dose statin for years.  At her last visit I had discontinued this hoping it might help her leg pain.   Current Outpatient Medications on File Prior to Visit  Medication Sig Dispense Refill  . ALPRAZolam (XANAX) 0.5 MG tablet TAKE 1 TABLET BY MOUTH TWICE A DAY AS NEEDED 60 tablet 1  . Cholecalciferol (VITAMIN D3 PO) Take by mouth daily.    . Cyanocobalamin 2500 MCG TABS Take by mouth daily.    . diclofenac Sodium (VOLTAREN) 1 % GEL Apply topically 4 (four) times daily.    Marland Kitchen dicyclomine (BENTYL) 10 MG capsule TAKE 1 CAPSULE BY MOUTH BEFORE EACH MEAL AND AT BEDTIME AS NEEDED FOR COLON SPASM 120 capsule 0  . Ferrous Sulfate (IRON PO) Take by mouth daily.    . fluticasone (FLONASE) 50 MCG/ACT nasal spray Place into both nostrils daily.    . hydrochlorothiazide (HYDRODIURIL) 25 MG tablet TAKE 1 TABLET DAILY. 90 tablet 0  . HYDROcodone-acetaminophen (NORCO/VICODIN) 5-325 MG tablet TAKE 1 TABLET BY MOUTH IN THE MORNING, AT NOON, AND AT BEDTIME. 90 tablet 0  . hydrOXYzine (ATARAX/VISTARIL) 25 MG tablet Take 25 mg by mouth 3 (three) times daily as needed.    Marland Kitchen levothyroxine (SYNTHROID) 50 MCG tablet TAKE 1  TABLET BY MOUTH DAILY 90 tablet 1  . lidocaine (LIDODERM) 5 % Place 3 patches onto the skin every 12 (twelve) hours.    . meclizine (ANTIVERT) 12.5 MG tablet Take 12.5 mg by mouth every 8 (eight) hours as needed for dizziness.  1  . nitroGLYCERIN (NITROSTAT) 0.4 MG SL tablet Place 0.4 mg under the tongue every 5 (five) minutes as needed for chest pain. X 3 doses    . ondansetron (ZOFRAN ODT) 4 MG disintegrating tablet Take 1 tablet (4 mg total) by mouth every 8 (eight) hours. 60 tablet 1  . pantoprazole (PROTONIX) 40 MG tablet TAKE 1 TABLET TWICE DAILY 180 tablet 0  . potassium chloride SA (KLOR-CON) 20 MEQ tablet TAKE 3 TABLETS BY MOUTH DAILY 90 tablet 3  . torsemide (DEMADEX) 20 MG tablet TAKE 1 TABLET BY MOUTH DAILY 5-6 HOURS AFTER FIRST DOSE OF HYDROCHLOROTHIAZIDE 90 tablet 2  . triamcinolone cream (KENALOG) 0.1 % Apply 1 application topically 2 (two) times daily as needed.      No current facility-administered medications on file prior to visit.   Past Medical History:  Diagnosis Date  . Anemia   . Aneurysm of other specified arteries (HCC)   . Anxiety   . Arthritis   . Asthma   . Bronchiectasis without acute exacerbation (HCC) 01/03/2015   CT chest 12/2014:  Bronchiectasis throughout right lung, left  clear.  +tree and bud, + patchy gg infiltrates in RLL Sputum 2016: no growth, AFB smear neg PFT's 02/2015:  Normal except mild decrease in DLCO   . Cataract    bilateral eye surgery  . Chronic chest pain   . Chronic idiopathic constipation   . Chronic kidney disease, stage 3   . Chronic venous hypertension (idiopathic) with other complications of bilateral lower extremity   . Depression   . Gastro-esophageal reflux   . Heart murmur   . Hyperlipemia   . Hypertension   . Idiopathic progressive neuropathy   . Iron (Fe) deficiency anemia   . Moderate protein-calorie malnutrition (HCC)   . Other idiopathic scoliosis, lumbar region   . Pneumathemia (HCC)   . Primary generalized  (osteo)arthritis   . Primary insomnia   . Radiculopathy, lumbar region   . Scoliosis   . Secondary hyperparathyroidism (HCC)   . Thyroid disease   . Unilateral primary osteoarthritis, left knee   . Unilateral primary osteoarthritis, right knee   . Varicose veins    Bilateral leg  . Vitamin D deficiency, unspecified    Past Surgical History:  Procedure Laterality Date  . ABDOMINAL HYSTERECTOMY    . CYSTOCELE REPAIR    . SALPINGOOPHORECTOMY      Family History  Problem Relation Age of Onset  . Emphysema Father   . Heart disease Mother   . Cancer Sister        metastatic lung cancer  . Heart disease Sister   . Hypertension Sister   . Varicose Veins Sister   . Hyperlipidemia Daughter   . Hypertension Daughter   . Diabetes Daughter   . Colon cancer Neg Hx   . Esophageal cancer Neg Hx   . Stomach cancer Neg Hx    Social History   Socioeconomic History  . Marital status: Married    Spouse name: carl  . Number of children: 3  . Years of education: 9  . Highest education level: Not on file  Occupational History  . Occupation: retired  Tobacco Use  . Smoking status: Never Smoker  . Smokeless tobacco: Never Used  Vaping Use  . Vaping Use: Never used  Substance and Sexual Activity  . Alcohol use: No    Alcohol/week: 0.0 standard drinks  . Drug use: No  . Sexual activity: Not Currently  Other Topics Concern  . Not on file  Social History Narrative   Right handed   Social Determinants of Health   Financial Resource Strain:   . Difficulty of Paying Living Expenses: Not on file  Food Insecurity:   . Worried About Programme researcher, broadcasting/film/videounning Out of Food in the Last Year: Not on file  . Ran Out of Food in the Last Year: Not on file  Transportation Needs:   . Lack of Transportation (Medical): Not on file  . Lack of Transportation (Non-Medical): Not on file  Physical Activity:   . Days of Exercise per Week: Not on file  . Minutes of Exercise per Session: Not on file  Stress:   .  Feeling of Stress : Not on file  Social Connections:   . Frequency of Communication with Friends and Family: Not on file  . Frequency of Social Gatherings with Friends and Family: Not on file  . Attends Religious Services: Not on file  . Active Member of Clubs or Organizations: Not on file  . Attends BankerClub or Organization Meetings: Not on file  . Marital Status: Not on file  Review of Systems  Constitutional: Positive for fatigue.  HENT: Positive for ear pain (right. a little.). Negative for congestion and sore throat.   Respiratory: Positive for cough (Baseline) and shortness of breath (Baseline).   Gastrointestinal: Positive for abdominal pain and constipation (Has constipation for 3 days.  Then bowels move and will leak for a few days.).  Endocrine: Positive for polydipsia. Negative for polyphagia and polyuria.  Genitourinary: Positive for dysuria. Negative for frequency.  Musculoskeletal: Positive for arthralgias, back pain (Hydrocodone helps but still hurts.  Always complains of leg pain.  Gabapentin helps but still not strong enough.) and myalgias.  Neurological: Positive for weakness and headaches (Mild chronic.).  Psychiatric/Behavioral: Negative for dysphoric mood. The patient is not nervous/anxious.      Objective:  BP 120/64   Pulse 88   Temp 97.6 F (36.4 C)   Resp 14   Ht 4\' 11"  (1.499 m)   Wt 75 lb 9.6 oz (34.3 kg)   BMI 15.27 kg/m   BP/Weight 06/15/2020 03/08/2020 11/26/2019  Systolic BP 120 130 -  Diastolic BP 64 70 -  Wt. (Lbs) 75.6 74.8 73.6  BMI 15.27 15.11 14.87    Physical Exam Vitals reviewed.  Constitutional:      Appearance: Normal appearance. She is normal weight.  HENT:     Right Ear: Tympanic membrane, ear canal and external ear normal.     Left Ear: Tympanic membrane, ear canal and external ear normal.  Neck:     Vascular: No carotid bruit.  Cardiovascular:     Rate and Rhythm: Normal rate and regular rhythm.     Pulses: Normal pulses.      Heart sounds: Normal heart sounds. No murmur heard.   Pulmonary:     Effort: Pulmonary effort is normal. No respiratory distress.     Breath sounds: Normal breath sounds.  Abdominal:     General: Abdomen is flat. Bowel sounds are normal.     Palpations: Abdomen is soft.     Tenderness: There is no abdominal tenderness.  Musculoskeletal:        General: Tenderness (Bilateral legs.  No nodules.  Numerous varicosities.) present. No swelling.  Neurological:     Mental Status: She is alert and oriented to person, place, and time.  Psychiatric:        Mood and Affect: Mood normal.        Behavior: Behavior normal.     Diabetic Foot Exam - Simple   No data filed       Lab Results  Component Value Date   WBC 10.7 06/15/2020   HGB 10.9 (L) 06/15/2020   HCT 33.4 (L) 06/15/2020   PLT 297 06/15/2020   GLUCOSE 84 06/15/2020   CHOL 194 03/08/2020   TRIG 90 03/08/2020   HDL 70 03/08/2020   LDLCALC 108 (H) 03/08/2020   ALT 13 06/15/2020   AST 17 06/15/2020   NA 146 (H) 06/15/2020   K 4.5 06/15/2020   CL 103 06/15/2020   CREATININE 0.89 06/15/2020   BUN 33 (H) 06/15/2020   CO2 28 06/15/2020   HGBA1C 5.5 03/08/2020      Assessment & Plan:   1. Acute cystitis without hematuria - POCT urinalysis dipstick - Urine Culture - sulfamethoxazole-trimethoprim (BACTRIM DS) 800-160 MG tablet; Take 1 tablet by mouth 2 (two) times daily.  Dispense: 14 tablet; Refill: 0  2. Other fatigue Chronic. Unable to find etiology. - CBC with Differential/Platelet - Comprehensive metabolic panel - Ambulatory referral  to Chronic Care Management Services  3. Essential hypertension The current medical regimen is effective;  continue present plan and medications. - CBC with Differential/Platelet - Comprehensive metabolic panel - Ambulatory referral to Chronic Care Management Services  4. COPD with chronic bronchitis (HCC) Stop stiolto and asmanex.  Start on trelegy one inhalation daily.  Rinse  mouth out after use.  Try to find flutter device to improve breathing. - Fluticasone-Umeclidin-Vilant (TRELEGY ELLIPTA) 100-62.5-25 MCG/INH AEPB; Inhale 1 puff into the lungs daily.  Dispense: 1 each; Refill: 2 - Ambulatory referral to Chronic Care Management Services  5. Major depression, recurrent, moderate Increase venlafaxine XR to 150 mg daily in am. - venlafaxine XR (EFFEXOR-XR) 150 MG 24 hr capsule; Take 1 capsule (150 mg total) by mouth daily with breakfast.  Dispense: 30 capsule; Refill: 2  6. Slow transit constipation Start Linzess 72 mcg once daily in a.m. - linaclotide (LINZESS) 72 MCG capsule; Take 1 capsule (72 mcg total) by mouth daily before breakfast.  Dispense: 30 capsule; Refill: 2  7. Pain in both lower extremities Increase gabapentin to 300 mg 3 times a day. - gabapentin (NEURONTIN) 300 MG capsule; Take 1 capsule (300 mg total) by mouth 3 (three) times daily.  Dispense: 90 capsule; Refill: 3  8. Lumbar back pain Increase gabapentin as above.  Continue hydrocodone. - gabapentin (NEURONTIN) 300 MG capsule; Take 1 capsule (300 mg total) by mouth 3 (three) times daily.  Dispense: 90 capsule; Refill: 3    Meds ordered this encounter  Medications  . Fluticasone-Umeclidin-Vilant (TRELEGY ELLIPTA) 100-62.5-25 MCG/INH AEPB    Sig: Inhale 1 puff into the lungs daily.    Dispense:  1 each    Refill:  2  . linaclotide (LINZESS) 72 MCG capsule    Sig: Take 1 capsule (72 mcg total) by mouth daily before breakfast.    Dispense:  30 capsule    Refill:  2  . gabapentin (NEURONTIN) 300 MG capsule    Sig: Take 1 capsule (300 mg total) by mouth 3 (three) times daily.    Dispense:  90 capsule    Refill:  3  . venlafaxine XR (EFFEXOR-XR) 150 MG 24 hr capsule    Sig: Take 1 capsule (150 mg total) by mouth daily with breakfast.    Dispense:  30 capsule    Refill:  2  . sulfamethoxazole-trimethoprim (BACTRIM DS) 800-160 MG tablet    Sig: Take 1 tablet by mouth 2 (two) times  daily.    Dispense:  14 tablet    Refill:  0    Orders Placed This Encounter  Procedures  . Urine Culture  . CBC with Differential/Platelet  . Comprehensive metabolic panel  . Ambulatory referral to Chronic Care Management Services  . POCT urinalysis dipstick     Follow-up: Return in about 4 weeks (around 07/13/2020).  An After Visit Summary was printed and given to the patient.  Blane Ohara [facility] [phone]

## 2020-06-15 NOTE — Patient Instructions (Addendum)
Stop stiolto and asmanex.  Start on trelegy one inhalation daily.  Rinse mouth out after use.  Try to find flutter device to improve breathing.  Back and leg pain: Increase gabapentin to 300 mg one twice a day x 1 week, then increase to 300 mg one three times a day.   Increase venlafaxine ER to 150 mg once daily in am. (may use up 75 mg dose by doubling, but still take both in am.   Constipation - stop miralax. Use linzess 75 mg once in am.   Bladder infection: start on bactrim ds one twice a day for one week.

## 2020-06-16 LAB — COMPREHENSIVE METABOLIC PANEL
ALT: 13 IU/L (ref 0–32)
AST: 17 IU/L (ref 0–40)
Albumin/Globulin Ratio: 1.1 — ABNORMAL LOW (ref 1.2–2.2)
Albumin: 3.9 g/dL (ref 3.6–4.6)
Alkaline Phosphatase: 77 IU/L (ref 48–121)
BUN/Creatinine Ratio: 37 — ABNORMAL HIGH (ref 12–28)
BUN: 33 mg/dL — ABNORMAL HIGH (ref 8–27)
Bilirubin Total: 0.3 mg/dL (ref 0.0–1.2)
CO2: 28 mmol/L (ref 20–29)
Calcium: 10.2 mg/dL (ref 8.7–10.3)
Chloride: 103 mmol/L (ref 96–106)
Creatinine, Ser: 0.89 mg/dL (ref 0.57–1.00)
GFR calc Af Amer: 68 mL/min/{1.73_m2} (ref >59)
GFR calc non Af Amer: 59 mL/min/{1.73_m2} — ABNORMAL LOW (ref >59)
Globulin, Total: 3.5 g/dL (ref 1.5–4.5)
Glucose: 84 mg/dL (ref 65–99)
Potassium: 4.5 mmol/L (ref 3.5–5.2)
Sodium: 146 mmol/L — ABNORMAL HIGH (ref 134–144)
Total Protein: 7.4 g/dL (ref 6.0–8.5)

## 2020-06-16 LAB — CBC WITH DIFFERENTIAL/PLATELET
Basophils Absolute: 0.1 10*3/uL (ref 0.0–0.2)
Basos: 1 %
EOS (ABSOLUTE): 0.4 10*3/uL (ref 0.0–0.4)
Eos: 4 %
Hematocrit: 33.4 % — ABNORMAL LOW (ref 34.0–46.6)
Hemoglobin: 10.9 g/dL — ABNORMAL LOW (ref 11.1–15.9)
Immature Grans (Abs): 0 10*3/uL (ref 0.0–0.1)
Immature Granulocytes: 0 %
Lymphocytes Absolute: 2.1 10*3/uL (ref 0.7–3.1)
Lymphs: 20 %
MCH: 31.7 pg (ref 26.6–33.0)
MCHC: 32.6 g/dL (ref 31.5–35.7)
MCV: 97 fL (ref 79–97)
Monocytes Absolute: 1 10*3/uL — ABNORMAL HIGH (ref 0.1–0.9)
Monocytes: 9 %
Neutrophils Absolute: 7.2 10*3/uL — ABNORMAL HIGH (ref 1.4–7.0)
Neutrophils: 66 %
Platelets: 297 10*3/uL (ref 150–450)
RBC: 3.44 x10E6/uL — ABNORMAL LOW (ref 3.77–5.28)
RDW: 12.2 % (ref 11.7–15.4)
WBC: 10.7 10*3/uL (ref 3.4–10.8)

## 2020-06-17 ENCOUNTER — Telehealth: Payer: Self-pay

## 2020-06-17 ENCOUNTER — Other Ambulatory Visit: Payer: Self-pay | Admitting: Family Medicine

## 2020-06-17 LAB — URINE CULTURE

## 2020-06-17 NOTE — Telephone Encounter (Signed)
Stephanie Clay called to report that she is dizzy and she feels that it is coming from her antibiotics.  Dr. Sedalia Muta feels that her symptoms are related to her increase in gabapentin.  Dr. Sedalia Muta advised that she decrease the medication to 100 mg tid and continue the bactrim.

## 2020-06-20 ENCOUNTER — Other Ambulatory Visit: Payer: Self-pay | Admitting: Family Medicine

## 2020-06-20 MED ORDER — AMOXICILLIN 500 MG PO CAPS: 500.0000 mg | ORAL_CAPSULE | Freq: Three times a day (TID) | ORAL | 0 refills | Status: DC

## 2020-06-20 MED ORDER — AMOXICILLIN 500 MG PO TABS: 500.0000 mg | ORAL_TABLET | Freq: Two times a day (BID) | ORAL | 0 refills | Status: DC

## 2020-06-20 NOTE — Progress Notes (Unsigned)
amoxicillin 

## 2020-06-27 ENCOUNTER — Encounter: Payer: Self-pay | Admitting: Family Medicine

## 2020-06-28 NOTE — Progress Notes (Deleted)
Office Visit Note  Patient: Stephanie Clay             Date of Birth: 29-Aug-1932           MRN: [id number]             PCP: Blane Ohara, MD Referring: Blane Ohara, MD Visit Date: 07/12/2020 Occupation: @GUAROCC @  Subjective:  No chief complaint on file.   History of Present Illness: Stephanie Clay is a 86 y.o. female ***   Activities of Daily Living:  Patient reports morning stiffness for *** {minute/hour:19697}.   Patient {ACTIONS;DENIES/REPORTS:21021675::"Denies"} nocturnal pain.  Difficulty dressing/grooming: {ACTIONS;DENIES/REPORTS:21021675::"Denies"} Difficulty climbing stairs: {ACTIONS;DENIES/REPORTS:21021675::"Denies"} Difficulty getting out of chair: {ACTIONS;DENIES/REPORTS:21021675::"Denies"} Difficulty using hands for taps, buttons, cutlery, and/or writing: {ACTIONS;DENIES/REPORTS:21021675::"Denies"}  No Rheumatology ROS completed.   PMFS History:  Patient Active Problem List   Diagnosis Date Noted  . Joint pain 03/24/2020  . Decreased pedal pulses 03/24/2020  . Severe protein-calorie malnutrition (HCC) 03/24/2020  . Normocytic anemia 01/07/2020  . Chronic bilateral low back pain without sciatica 11/29/2019  . Mild vitamin D deficiency 11/29/2019  . Localized edema 11/26/2019  . Other fatigue 11/26/2019  . Abdominal pain 01/27/2018  . Allergic rhinitis 08/20/2016  . Underweight 07/02/2016  . Hypomagnesemia 07/01/2016  . Hypokalemia 07/01/2016  . Hyperglycemia 07/01/2016  . Hypothyroidism 07/01/2016  . Essential hypertension 07/01/2016  . Hyperlipidemia 07/01/2016  . AKI (acute kidney injury) (HCC) 06/30/2016  . Dysphagia 03/01/2015  . Bronchiectasis with evidence of chronic atypical infection 01/03/2015    Past Medical History:  Diagnosis Date  . Anemia   . Aneurysm of other specified arteries (HCC)   . Anxiety   . Arthritis   . Asthma   . Bronchiectasis without acute exacerbation (HCC) 01/03/2015   CT chest 12/2014:   Bronchiectasis throughout right lung, left clear.  +tree and bud, + patchy gg infiltrates in RLL Sputum 2016: no growth, AFB smear neg PFT's 02/2015:  Normal except mild decrease in DLCO   . Cataract    bilateral eye surgery  . Chronic chest pain   . Chronic idiopathic constipation   . Chronic kidney disease, stage 3   . Chronic venous hypertension (idiopathic) with other complications of bilateral lower extremity   . Depression   . Gastro-esophageal reflux   . Heart murmur   . Hyperlipemia   . Hypertension   . Idiopathic progressive neuropathy   . Iron (Fe) deficiency anemia   . Moderate protein-calorie malnutrition (HCC)   . Other idiopathic scoliosis, lumbar region   . Pneumathemia (HCC)   . Primary generalized (osteo)arthritis   . Primary insomnia   . Radiculopathy, lumbar region   . Scoliosis   . Secondary hyperparathyroidism (HCC)   . Thyroid disease   . Unilateral primary osteoarthritis, left knee   . Unilateral primary osteoarthritis, right knee   . Varicose veins    Bilateral leg  . Vitamin D deficiency, unspecified     Family History  Problem Relation Age of Onset  . Emphysema Father   . Heart disease Mother   . Cancer Sister        metastatic lung cancer  . Heart disease Sister   . Hypertension Sister   . Varicose Veins Sister   . Hyperlipidemia Daughter   . Hypertension Daughter   . Diabetes Daughter   . Colon cancer Neg Hx   . Esophageal cancer Neg Hx   . Stomach cancer Neg Hx    Past Surgical History:  Procedure Laterality Date  . ABDOMINAL HYSTERECTOMY    . CYSTOCELE REPAIR    . SALPINGOOPHORECTOMY     Social History   Social History Narrative   Right handed   Immunization History  Administered Date(s) Administered  . Influenza, High Dose Seasonal PF 06/28/2017  . Influenza,inj,Quad PF,6+ Mos 06/30/2015, 08/20/2016  . Influenza-Unspecified 07/22/2014, 07/23/2019  . Pneumococcal Conjugate-13 06/20/2015  . Pneumococcal Polysaccharide-23  07/23/2007  . Tdap 02/20/2018     Objective: Vital Signs: There were no vitals taken for this visit.   Physical Exam   Musculoskeletal Exam: ***  CDAI Exam: CDAI Score: -- Patient Global: --; Provider Global: -- Swollen: --; Tender: -- Joint Exam 07/12/2020   No joint exam has been documented for this visit   There is currently no information documented on the homunculus. Go to the Rheumatology activity and complete the homunculus joint exam.  Investigation: No additional findings.  Imaging: No results found.  Recent Labs: Lab Results  Component Value Date   WBC 10.7 06/15/2020   HGB 10.9 (L) 06/15/2020   PLT 297 06/15/2020   NA 146 (H) 06/15/2020   K 4.5 06/15/2020   CL 103 06/15/2020   CO2 28 06/15/2020   GLUCOSE 84 06/15/2020   BUN 33 (H) 06/15/2020   CREATININE 0.89 06/15/2020   BILITOT 0.3 06/15/2020   ALKPHOS 77 06/15/2020   AST 17 06/15/2020   ALT 13 06/15/2020   PROT 7.4 06/15/2020   ALBUMIN 3.9 06/15/2020   CALCIUM 10.2 06/15/2020   GFRAA 68 06/15/2020    Speciality Comments: No specialty comments available.  Procedures:  No procedures performed Allergies: Lisinopril, Remeron [mirtazapine], Ciprofloxacin, Levofloxacin, Nitrofurantoin, Omnicef [cefdinir], and Sertraline hcl   Assessment / Plan:     Visit Diagnoses: No diagnosis found.  Orders: No orders of the defined types were placed in this encounter.  No orders of the defined types were placed in this encounter.   Face-to-face time spent with patient was *** minutes. Greater than 50% of time was spent in counseling and coordination of care.  Follow-Up Instructions: No follow-ups on file.   Gearldine Bienenstock, PA-C  Note - This record has been created using Dragon software.  Chart creation errors have been sought, but may not always  have been located. Such creation errors do not reflect on  the standard of medical care.

## 2020-06-30 ENCOUNTER — Other Ambulatory Visit: Payer: Self-pay | Admitting: Legal Medicine

## 2020-06-30 NOTE — Telephone Encounter (Signed)
I believe this is your patient lp 

## 2020-07-01 ENCOUNTER — Telehealth: Payer: Self-pay | Admitting: Family Medicine

## 2020-07-01 NOTE — Progress Notes (Signed)
°  Chronic Care Management   Outreach Note  07/01/2020 Name: Stephanie Clay MRN: [id number] DOB: Mar 24, 1932  Referred by: Blane Ohara, MD Reason for referral : No chief complaint on file.   An unsuccessful telephone outreach was attempted today. The patient was referred to the pharmacist for assistance with care management and care coordination.   Follow Up Plan:   Lynnae January [employer] Scheduler

## 2020-07-05 DIAGNOSIS — Z743 Need for continuous supervision: Secondary | ICD-10-CM | POA: Diagnosis not present

## 2020-07-05 DIAGNOSIS — R069 Unspecified abnormalities of breathing: Secondary | ICD-10-CM | POA: Diagnosis not present

## 2020-07-05 DIAGNOSIS — R109 Unspecified abdominal pain: Secondary | ICD-10-CM | POA: Diagnosis not present

## 2020-07-05 DIAGNOSIS — S51812A Laceration without foreign body of left forearm, initial encounter: Secondary | ICD-10-CM | POA: Diagnosis not present

## 2020-07-05 DIAGNOSIS — W19XXXA Unspecified fall, initial encounter: Secondary | ICD-10-CM | POA: Diagnosis not present

## 2020-07-05 DIAGNOSIS — S22000A Wedge compression fracture of unspecified thoracic vertebra, initial encounter for closed fracture: Secondary | ICD-10-CM | POA: Diagnosis not present

## 2020-07-05 DIAGNOSIS — R0781 Pleurodynia: Secondary | ICD-10-CM | POA: Diagnosis not present

## 2020-07-05 DIAGNOSIS — M549 Dorsalgia, unspecified: Secondary | ICD-10-CM | POA: Diagnosis not present

## 2020-07-05 DIAGNOSIS — M5489 Other dorsalgia: Secondary | ICD-10-CM | POA: Diagnosis not present

## 2020-07-05 DIAGNOSIS — R52 Pain, unspecified: Secondary | ICD-10-CM | POA: Diagnosis not present

## 2020-07-06 ENCOUNTER — Other Ambulatory Visit: Payer: Self-pay | Admitting: Family Medicine

## 2020-07-12 ENCOUNTER — Ambulatory Visit: Payer: Medicare Other | Admitting: Rheumatology

## 2020-07-13 ENCOUNTER — Telehealth (INDEPENDENT_AMBULATORY_CARE_PROVIDER_SITE_OTHER): Payer: Medicare Other | Admitting: Family Medicine

## 2020-07-13 ENCOUNTER — Encounter: Payer: Self-pay | Admitting: Family Medicine

## 2020-07-13 VITALS — HR 89 | Wt 78.1 lb

## 2020-07-13 DIAGNOSIS — M545 Low back pain, unspecified: Secondary | ICD-10-CM

## 2020-07-13 DIAGNOSIS — S22080A Wedge compression fracture of T11-T12 vertebra, initial encounter for closed fracture: Secondary | ICD-10-CM

## 2020-07-13 DIAGNOSIS — I700 Atherosclerosis of aorta: Secondary | ICD-10-CM

## 2020-07-13 DIAGNOSIS — F331 Major depressive disorder, recurrent, moderate: Secondary | ICD-10-CM

## 2020-07-13 DIAGNOSIS — S22050A Wedge compression fracture of T5-T6 vertebra, initial encounter for closed fracture: Secondary | ICD-10-CM | POA: Diagnosis not present

## 2020-07-13 DIAGNOSIS — J449 Chronic obstructive pulmonary disease, unspecified: Secondary | ICD-10-CM | POA: Diagnosis not present

## 2020-07-13 MED ORDER — OXYCODONE HCL 5 MG PO TABS: 5.0000 mg | ORAL_TABLET | Freq: Four times a day (QID) | ORAL | 0 refills | Status: DC | PRN

## 2020-07-13 NOTE — Progress Notes (Signed)
Subjective:  Patient ID: Stephanie Clay, female    DOB: 1932/09/18  Age: 86 y.o. MRN: [id number]  Chief Complaint  Patient presents with  . COPD    HPI COPD: Patient is having issues utilizing Asmanex due to her arthritis in her hands.  Her breathing and coughing her baseline.  She does still feel short of breath at times. Has not Stopped stiolto and asmanex, because had some left.  Recommended to Start on trelegy one inhalation daily upon completion of these other medications.  Changes only made due to ease of use of device. Using flutter device to improve breathing.  Depression: Patient continues to suffer from depression.  Increased venlafaxine ER 150 once in am. This has helped some.   Patient presented to the emergency department on July 05, 2020 with worsening back pain after falling.  This occurred approximately 4 AM.  Apparently she landed flat on her bottom.  She had a skin tear of her left forearm and hit her left chest on the furniture and she was complaining of left and right rib pain and low back pain as well as bilateral hip pain.  CT scan of chest abdomen pelvis was performed.  There were 2 newly seen compression fractures at T6 and T12 compared with previous CT scans.  There was a hazy opacity in the left lower lobe that might represent pneumonia.  She does have areas of bronchiectasis and pulmonary scarring.  No rib fractures identified.  She did have a large amount of stool in her colon at the time.  She also was found to have aortic atherosclerosis.  Patient reports having significant pain.  The left skin tear was sealed with Dermabond successfully.  Labs done at the hospital which included a urinalysis, chemistry panel, blood count all were essentially baseline are normal.  Patient was given Flexeril and oxycodone.  She was instructed to stop the hydrocodone which I have her on 3 times a day regularly for her chronic back pain.  She has an appointment to  follow-up with Dr. Loralie Champagneurrani. .  I did increase her gabapentin at her last visit.  She is unsure if this has helped.  She does not feel has made her more dizzy. Aortic atherosclerosis found on CT scan.  Patient had been well controlled on cholesterol medicines for years.  She had complained of leg pain also for years and so I discontinued her cholesterol medicine approximately 6 months ago.  I am unsure that she made any difference.  Current Outpatient Medications on File Prior to Visit  Medication Sig Dispense Refill  . ALPRAZolam (XANAX) 0.5 MG tablet TAKE 1 TABLET BY MOUTH TWICE A DAY AS NEEDED 60 tablet 2  . amoxicillin (AMOXIL) 500 MG tablet Take 1 tablet (500 mg total) by mouth 2 (two) times daily. For bladder infection. 14 tablet 0  . Cholecalciferol (VITAMIN D3 PO) Take by mouth daily.    . Cyanocobalamin 2500 MCG TABS Take by mouth daily.    . diclofenac Sodium (VOLTAREN) 1 % GEL Apply topically 4 (four) times daily.    Marland Kitchen. dicyclomine (BENTYL) 10 MG capsule TAKE 1 CAPSULE BY MOUTH BEFORE EACH MEAL AND AT BEDTIME AS NEEDED FOR COLON SPASM 120 capsule 0  . Ferrous Sulfate (IRON PO) Take by mouth daily.    . fluticasone (FLONASE) 50 MCG/ACT nasal spray Place into both nostrils daily.    . Fluticasone-Umeclidin-Vilant (TRELEGY ELLIPTA) 100-62.5-25 MCG/INH AEPB Inhale 1 puff into the lungs daily. 1 each  2  . gabapentin (NEURONTIN) 300 MG capsule Take 1 capsule (300 mg total) by mouth 3 (three) times daily. 90 capsule 3  . hydrochlorothiazide (HYDRODIURIL) 25 MG tablet TAKE 1 TABLET DAILY. 90 tablet 0  . HYDROcodone-acetaminophen (NORCO/VICODIN) 5-325 MG tablet TAKE 1 TABLET BY MOUTH IN THE MORNING, AT NOON, AND AT BEDTIME. 90 tablet 0  . hydrOXYzine (ATARAX/VISTARIL) 25 MG tablet Take 25 mg by mouth 3 (three) times daily as needed.    Marland Kitchen levothyroxine (SYNTHROID) 50 MCG tablet TAKE 1 TABLET BY MOUTH DAILY 90 tablet 1  . lidocaine (LIDODERM) 5 % Place 3 patches onto the skin every 12 (twelve)  hours.    Marland Kitchen linaclotide (LINZESS) 72 MCG capsule Take 1 capsule (72 mcg total) by mouth daily before breakfast. 30 capsule 2  . meclizine (ANTIVERT) 12.5 MG tablet Take 12.5 mg by mouth every 8 (eight) hours as needed for dizziness.  1  . nitroGLYCERIN (NITROSTAT) 0.4 MG SL tablet Place 0.4 mg under the tongue every 5 (five) minutes as needed for chest pain. X 3 doses    . ondansetron (ZOFRAN ODT) 4 MG disintegrating tablet Take 1 tablet (4 mg total) by mouth every 8 (eight) hours. 60 tablet 1  . ondansetron (ZOFRAN) 4 MG tablet TAKE 1 TABLET BY MOUTH THREE TIMES A DAY FOR NAUSEA. 90 tablet 0  . pantoprazole (PROTONIX) 40 MG tablet TAKE 1 TABLET TWICE DAILY 180 tablet 0  . potassium chloride SA (KLOR-CON) 20 MEQ tablet TAKE 3 TABLETS BY MOUTH DAILY 90 tablet 3  . sulfamethoxazole-trimethoprim (BACTRIM DS) 800-160 MG tablet Take 1 tablet by mouth 2 (two) times daily. 14 tablet 0  . torsemide (DEMADEX) 20 MG tablet TAKE 1 TABLET BY MOUTH DAILY 5-6 HOURS AFTER FIRST DOSE OF HYDROCHLOROTHIAZIDE 90 tablet 2  . triamcinolone cream (KENALOG) 0.1 % Apply 1 application topically 2 (two) times daily as needed.     . venlafaxine XR (EFFEXOR-XR) 150 MG 24 hr capsule Take 1 capsule (150 mg total) by mouth daily with breakfast. 30 capsule 2   No current facility-administered medications on file prior to visit.   Past Medical History:  Diagnosis Date  . Anemia   . Aneurysm of other specified arteries (HCC)   . Anxiety   . Arthritis   . Asthma   . Bronchiectasis without acute exacerbation (HCC) 01/03/2015   CT chest 12/2014:  Bronchiectasis throughout right lung, left clear.  +tree and bud, + patchy gg infiltrates in RLL Sputum 2016: no growth, AFB smear neg PFT's 02/2015:  Normal except mild decrease in DLCO   . Cataract    bilateral eye surgery  . Chronic chest pain   . Chronic idiopathic constipation   . Chronic kidney disease, stage 3   . Chronic venous hypertension (idiopathic) with other  complications of bilateral lower extremity   . Depression   . Gastro-esophageal reflux   . Heart murmur   . Hyperlipemia   . Hypertension   . Idiopathic progressive neuropathy   . Iron (Fe) deficiency anemia   . Moderate protein-calorie malnutrition (HCC)   . Other idiopathic scoliosis, lumbar region   . Pneumathemia (HCC)   . Primary generalized (osteo)arthritis   . Primary insomnia   . Radiculopathy, lumbar region   . Scoliosis   . Secondary hyperparathyroidism (HCC)   . Thyroid disease   . Unilateral primary osteoarthritis, left knee   . Unilateral primary osteoarthritis, right knee   . Varicose veins    Bilateral leg  .  Vitamin D deficiency, unspecified    Past Surgical History:  Procedure Laterality Date  . ABDOMINAL HYSTERECTOMY    . CYSTOCELE REPAIR    . SALPINGOOPHORECTOMY      Family History  Problem Relation Age of Onset  . Emphysema Father   . Heart disease Mother   . Cancer Sister        metastatic lung cancer  . Heart disease Sister   . Hypertension Sister   . Varicose Veins Sister   . Hyperlipidemia Daughter   . Hypertension Daughter   . Diabetes Daughter   . Colon cancer Neg Hx   . Esophageal cancer Neg Hx   . Stomach cancer Neg Hx    Social History   Socioeconomic History  . Marital status: Married    Spouse name: carl  . Number of children: 3  . Years of education: 9  . Highest education level: Not on file  Occupational History  . Occupation: retired  Tobacco Use  . Smoking status: Never Smoker  . Smokeless tobacco: Never Used  Vaping Use  . Vaping Use: Never used  Substance and Sexual Activity  . Alcohol use: No    Alcohol/week: 0.0 standard drinks  . Drug use: No  . Sexual activity: Not Currently  Other Topics Concern  . Not on file  Social History Narrative   Right handed   Social Determinants of Health   Financial Resource Strain:   . Difficulty of Paying Living Expenses: Not on file  Food Insecurity:   . Worried About  Programme researcher, broadcasting/film/video in the Last Year: Not on file  . Ran Out of Food in the Last Year: Not on file  Transportation Needs:   . Lack of Transportation (Medical): Not on file  . Lack of Transportation (Non-Medical): Not on file  Physical Activity:   . Days of Exercise per Week: Not on file  . Minutes of Exercise per Session: Not on file  Stress:   . Feeling of Stress : Not on file  Social Connections:   . Frequency of Communication with Friends and Family: Not on file  . Frequency of Social Gatherings with Friends and Family: Not on file  . Attends Religious Services: Not on file  . Active Member of Clubs or Organizations: Not on file  . Attends Banker Meetings: Not on file  . Marital Status: Not on file    Review of Systems  Constitutional: Positive for fatigue. Negative for chills, diaphoresis and fever.  HENT: Negative for congestion, ear pain and sore throat.   Respiratory: Positive for shortness of breath (Baseline). Negative for cough (Baseline).   Gastrointestinal: Positive for abdominal pain and constipation (no better with linzess 72 mcg daily.  Still leaking on pad. ).  Endocrine: Positive for polydipsia. Negative for polyphagia and polyuria.  Genitourinary: Negative for dysuria and frequency.  Musculoskeletal: Positive for arthralgias, back pain (Pt fell one week ago and went to ED and found to have thoracic compression fracture. ) and myalgias.  Neurological: Positive for weakness and headaches (Mild chronic.).  Psychiatric/Behavioral: Negative for dysphoric mood (improved on effexor xr. ). The patient is not nervous/anxious.      Objective:  Pulse 89   Wt 78 lb 1.6 oz (35.4 kg)   SpO2 92%   BMI 15.77 kg/m   BP/Weight 07/13/2020 06/15/2020 03/08/2020  Systolic BP - 120 130  Diastolic BP - 64 70  Wt. (Lbs) 78.1 75.6 74.8  BMI 15.77 15.27 15.11  Physical Exam Vitals reviewed.  Constitutional:      Appearance: Normal appearance. She is normal  weight.  Cardiovascular:     Rate and Rhythm: Normal rate and regular rhythm.     Heart sounds: Normal heart sounds. No murmur heard.   Pulmonary:     Effort: Pulmonary effort is normal. No respiratory distress.     Breath sounds: Normal breath sounds.  Abdominal:     General: Abdomen is flat. Bowel sounds are normal.     Palpations: Abdomen is soft.     Tenderness: There is no abdominal tenderness.  Musculoskeletal:        General: Swelling and tenderness (Thoracic.  Lumbar also.) present.  Skin:    Comments: Skin tear healing well.  Neurological:     Mental Status: She is alert and oriented to person, place, and time.  Psychiatric:        Mood and Affect: Mood normal.        Behavior: Behavior normal.     Diabetic Foot Exam - Simple   No data filed       Lab Results  Component Value Date   WBC 10.7 06/15/2020   HGB 10.9 (L) 06/15/2020   HCT 33.4 (L) 06/15/2020   PLT 297 06/15/2020   GLUCOSE 84 06/15/2020   CHOL 194 03/08/2020   TRIG 90 03/08/2020   HDL 70 03/08/2020   LDLCALC 108 (H) 03/08/2020   ALT 13 06/15/2020   AST 17 06/15/2020   NA 146 (H) 06/15/2020   K 4.5 06/15/2020   CL 103 06/15/2020   CREATININE 0.89 06/15/2020   BUN 33 (H) 06/15/2020   CO2 28 06/15/2020   HGBA1C 5.5 03/08/2020      Assessment & Plan:  1. Compression fracture of T6 vertebra, initial encounter (HCC) Refill of oxycodone given.  Patient follow-up with Dr. Loralie Champagne.  May need to transfer chronic hydrocodone treatment to oxycodone based.  2. Compression fracture of T12 vertebra, initial encounter (HCC) Refill of oxycodone given.  Patient follow-up with Dr. Loralie Champagne.  May need to transfer chronic hydrocodone treatment to oxycodone based.  3. COPD with chronic bronchitis (HCC) Continue Stiolto and Asmanex till gone and start on Trelegy 1 inhalation daily.  4. Lumbar back pain May need to convert hydrocodone to oxycodone therapy or consider referral to pain clinic.  5.  Depression, major, recurrent, moderate (HCC) Improved.  6. Atherosclerosis of aorta (HCC) Consider restarting a statin medicine.  I do not feel discontinuation has lessened her leg pain.  Follow-up: Return in about 4 weeks (around 08/10/2020).  An After Visit Summary was printed and given to the patient.  Blane Ohara [facility] [phone]

## 2020-07-19 ENCOUNTER — Telehealth: Payer: Self-pay

## 2020-07-19 NOTE — Telephone Encounter (Signed)
[name] called to report that Stephanie Clay has been sick with sorethroat, cough, fatigue and diarrhea for the last 24 hours.  She started with diarrhea on Sunday but she also took miralax on Sunday.  Televisit scheduled for tomorrow morning with Dr. Sedalia Muta.

## 2020-07-20 ENCOUNTER — Telehealth (INDEPENDENT_AMBULATORY_CARE_PROVIDER_SITE_OTHER): Payer: Medicare Other | Admitting: Family Medicine

## 2020-07-20 ENCOUNTER — Encounter: Payer: Self-pay | Admitting: Family Medicine

## 2020-07-20 VITALS — HR 90

## 2020-07-20 DIAGNOSIS — R5381 Other malaise: Secondary | ICD-10-CM | POA: Diagnosis not present

## 2020-07-20 DIAGNOSIS — J028 Acute pharyngitis due to other specified organisms: Secondary | ICD-10-CM | POA: Diagnosis not present

## 2020-07-20 LAB — POCT INFLUENZA A/B
Influenza A, POC: NEGATIVE
Influenza B, POC: NEGATIVE

## 2020-07-20 LAB — POC COVID19 BINAXNOW: SARS Coronavirus 2 Ag: NEGATIVE

## 2020-07-20 LAB — POCT RAPID STREP A (OFFICE): Rapid Strep A Screen: NEGATIVE

## 2020-07-20 MED ORDER — AMOXICILLIN 500 MG PO CAPS: 500.0000 mg | ORAL_CAPSULE | Freq: Two times a day (BID) | ORAL | 0 refills | Status: DC

## 2020-07-20 NOTE — Progress Notes (Signed)
Virtual Visit via Telephone Note   This visit type was conducted due to national recommendations for restrictions regarding the COVID-19 Pandemic (e.g. social distancing) in an effort to limit this patient's exposure and mitigate transmission in our community.  Due to her co-morbid illnesses, this patient is at least at moderate risk for complications without adequate follow up.  This format is felt to be most appropriate for this patient at this time.  The patient did not have access to video technology/had technical difficulties with video requiring transitioning to audio format only (telephone).  All issues noted in this document were discussed and addressed.  No physical exam could be performed with this format.  Patient verbally consented to a telehealth visit.   Date:  07/20/2020   ID:  Stephanie Clay, DOB 1931/12/06, MRN [id number]  Patient Location: Home Provider Location: Office/Clinic  PCP:  Blane Ohara, MD   Evaluation Performed: acute visit  Chief Complaint:  Sore throat  History of Present Illness:    Stephanie Clay is a 86 y.o. female complaining of fatigue, sore throat. No fever, sweats, earaches, nasal congestion. Having chills and rhinorrhea. No sneezing.  No one else is sick. Poor appetite. Nauseas with eating and Abdominal pain has been ongoing.   The patient does have symptoms concerning for COVID-19 infection (fever, chills, cough, or new shortness of breath).    Past Medical History:  Diagnosis Date  . Anemia   . Aneurysm of other specified arteries (HCC)   . Anxiety   . Arthritis   . Asthma   . Bronchiectasis without acute exacerbation (HCC) 01/03/2015   CT chest 12/2014:  Bronchiectasis throughout right lung, left clear.  +tree and bud, + patchy gg infiltrates in RLL Sputum 2016: no growth, AFB smear neg PFT's 02/2015:  Normal except mild decrease in DLCO   . Cataract    bilateral eye surgery  . Chronic chest pain   . Chronic  idiopathic constipation   . Chronic kidney disease, stage 3   . Chronic venous hypertension (idiopathic) with other complications of bilateral lower extremity   . Depression   . Gastro-esophageal reflux   . Heart murmur   . Hyperlipemia   . Hypertension   . Idiopathic progressive neuropathy   . Iron (Fe) deficiency anemia   . Moderate protein-calorie malnutrition (HCC)   . Other idiopathic scoliosis, lumbar region   . Pneumathemia (HCC)   . Primary generalized (osteo)arthritis   . Primary insomnia   . Radiculopathy, lumbar region   . Scoliosis   . Secondary hyperparathyroidism (HCC)   . Thyroid disease   . Unilateral primary osteoarthritis, left knee   . Unilateral primary osteoarthritis, right knee   . Varicose veins    Bilateral leg  . Vitamin D deficiency, unspecified     Past Surgical History:  Procedure Laterality Date  . ABDOMINAL HYSTERECTOMY    . CYSTOCELE REPAIR    . SALPINGOOPHORECTOMY      Family History  Problem Relation Age of Onset  . Emphysema Father   . Heart disease Mother   . Cancer Sister        metastatic lung cancer  . Heart disease Sister   . Hypertension Sister   . Varicose Veins Sister   . Hyperlipidemia Daughter   . Hypertension Daughter   . Diabetes Daughter   . Colon cancer Neg Hx   . Esophageal cancer Neg Hx   . Stomach cancer Neg Hx  Social History   Socioeconomic History  . Marital status: Married    Spouse name: carl  . Number of children: 3  . Years of education: 9  . Highest education level: Not on file  Occupational History  . Occupation: retired  Tobacco Use  . Smoking status: Never Smoker  . Smokeless tobacco: Never Used  Vaping Use  . Vaping Use: Never used  Substance and Sexual Activity  . Alcohol use: No    Alcohol/week: 0.0 standard drinks  . Drug use: No  . Sexual activity: Not Currently  Other Topics Concern  . Not on file  Social History Narrative   Right handed   Social Determinants of Health    Financial Resource Strain:   . Difficulty of Paying Living Expenses: Not on file  Food Insecurity:   . Worried About Programme researcher, broadcasting/film/videounning Out of Food in the Last Year: Not on file  . Ran Out of Food in the Last Year: Not on file  Transportation Needs:   . Lack of Transportation (Medical): Not on file  . Lack of Transportation (Non-Medical): Not on file  Physical Activity:   . Days of Exercise per Week: Not on file  . Minutes of Exercise per Session: Not on file  Stress:   . Feeling of Stress : Not on file  Social Connections:   . Frequency of Communication with Friends and Family: Not on file  . Frequency of Social Gatherings with Friends and Family: Not on file  . Attends Religious Services: Not on file  . Active Member of Clubs or Organizations: Not on file  . Attends BankerClub or Organization Meetings: Not on file  . Marital Status: Not on file  Intimate Partner Violence:   . Fear of Current or Ex-Partner: Not on file  . Emotionally Abused: Not on file  . Physically Abused: Not on file  . Sexually Abused: Not on file    Outpatient Medications Prior to Visit  Medication Sig Dispense Refill  . ALPRAZolam (XANAX) 0.5 MG tablet TAKE 1 TABLET BY MOUTH TWICE A DAY AS NEEDED 60 tablet 2  . Cholecalciferol (VITAMIN D3 PO) Take by mouth daily.    . Cyanocobalamin 2500 MCG TABS Take by mouth daily.    . diclofenac Sodium (VOLTAREN) 1 % GEL Apply topically 4 (four) times daily.    Marland Kitchen. dicyclomine (BENTYL) 10 MG capsule TAKE 1 CAPSULE BY MOUTH BEFORE EACH MEAL AND AT BEDTIME AS NEEDED FOR COLON SPASM 120 capsule 0  . Ferrous Sulfate (IRON PO) Take by mouth daily.    . fluticasone (FLONASE) 50 MCG/ACT nasal spray Place into both nostrils daily.    . Fluticasone-Umeclidin-Vilant (TRELEGY ELLIPTA) 100-62.5-25 MCG/INH AEPB Inhale 1 puff into the lungs daily. 1 each 2  . gabapentin (NEURONTIN) 300 MG capsule Take 1 capsule (300 mg total) by mouth 3 (three) times daily. 90 capsule 3  . hydrochlorothiazide  (HYDRODIURIL) 25 MG tablet TAKE 1 TABLET DAILY. 90 tablet 0  . HYDROcodone-acetaminophen (NORCO/VICODIN) 5-325 MG tablet TAKE 1 TABLET BY MOUTH IN THE MORNING, AT NOON, AND AT BEDTIME. 90 tablet 0  . hydrOXYzine (ATARAX/VISTARIL) 25 MG tablet Take 25 mg by mouth 3 (three) times daily as needed.    Marland Kitchen. levothyroxine (SYNTHROID) 50 MCG tablet TAKE 1 TABLET BY MOUTH DAILY 90 tablet 1  . lidocaine (LIDODERM) 5 % Place 3 patches onto the skin every 12 (twelve) hours.    Marland Kitchen. linaclotide (LINZESS) 72 MCG capsule Take 1 capsule (72 mcg total) by mouth  daily before breakfast. 30 capsule 2  . meclizine (ANTIVERT) 12.5 MG tablet Take 12.5 mg by mouth every 8 (eight) hours as needed for dizziness.  1  . nitroGLYCERIN (NITROSTAT) 0.4 MG SL tablet Place 0.4 mg under the tongue every 5 (five) minutes as needed for chest pain. X 3 doses    . ondansetron (ZOFRAN ODT) 4 MG disintegrating tablet Take 1 tablet (4 mg total) by mouth every 8 (eight) hours. 60 tablet 1  . ondansetron (ZOFRAN) 4 MG tablet TAKE 1 TABLET BY MOUTH THREE TIMES A DAY FOR NAUSEA. 90 tablet 0  . oxyCODONE (ROXICODONE) 5 MG immediate release tablet Take 1 tablet (5 mg total) by mouth every 6 (six) hours as needed for severe pain (compression fratures). 30 tablet 0  . pantoprazole (PROTONIX) 40 MG tablet TAKE 1 TABLET TWICE DAILY 180 tablet 0  . potassium chloride SA (KLOR-CON) 20 MEQ tablet TAKE 3 TABLETS BY MOUTH DAILY 90 tablet 3  . sulfamethoxazole-trimethoprim (BACTRIM DS) 800-160 MG tablet Take 1 tablet by mouth 2 (two) times daily. 14 tablet 0  . torsemide (DEMADEX) 20 MG tablet TAKE 1 TABLET BY MOUTH DAILY 5-6 HOURS AFTER FIRST DOSE OF HYDROCHLOROTHIAZIDE 90 tablet 2  . triamcinolone cream (KENALOG) 0.1 % Apply 1 application topically 2 (two) times daily as needed.     . venlafaxine XR (EFFEXOR-XR) 150 MG 24 hr capsule Take 1 capsule (150 mg total) by mouth daily with breakfast. 30 capsule 2  . amoxicillin (AMOXIL) 500 MG tablet Take 1 tablet  (500 mg total) by mouth 2 (two) times daily. For bladder infection. 14 tablet 0   No facility-administered medications prior to visit.   .med Allergies:   Lisinopril, Remeron [mirtazapine], Ciprofloxacin, Levofloxacin, Nitrofurantoin, Omnicef [cefdinir], and Sertraline hcl   Social History   Tobacco Use  . Smoking status: Never Smoker  . Smokeless tobacco: Never Used  Vaping Use  . Vaping Use: Never used  Substance Use Topics  . Alcohol use: No    Alcohol/week: 0.0 standard drinks  . Drug use: No     ROS   Labs/Other Tests and Data Reviewed:    Recent Labs: 06/15/2020: ALT 13; BUN 33; Creatinine, Ser 0.89; Hemoglobin 10.9; Platelets 297; Potassium 4.5; Sodium 146   Recent Lipid Panel Lab Results  Component Value Date/Time   CHOL 194 03/08/2020 12:13 PM   TRIG 90 03/08/2020 12:13 PM   HDL 70 03/08/2020 12:13 PM   CHOLHDL 2.8 03/08/2020 12:13 PM   LDLCALC 108 (H) 03/08/2020 12:13 PM    Wt Readings from Last 3 Encounters:  07/13/20 78 lb 1.6 oz (35.4 kg)  06/15/20 75 lb 9.6 oz (34.3 kg)  03/08/20 74 lb 12.8 oz (33.9 kg)     Objective:    Vital Signs:  Pulse 90   SpO2 95%    Physical Exam   ASSESSMENT & PLAN:   1. Malaise - POC COVID-19 negative. - Novel Coronavirus, NAA ([facility]) - Rapid Strep A negative - Influenza A/B negative.  2. Pharyngitis due to other organism - POC COVID-19 negative - Novel Coronavirus, NAA ([facility]) - Rapid Strep A negative - Influenza A/B negative. - Amoxicillin 875 mg one twice a day x 10 days.    Orders Placed This Encounter  Procedures  . Novel Coronavirus, NAA ([facility])  . POC COVID-19  . Rapid Strep A  . Influenza A/B     Meds ordered this encounter  Medications  . amoxicillin (AMOXIL) 500 MG capsule    Sig:  Take 1 capsule (500 mg total) by mouth 2 (two) times daily.    Dispense:  20 capsule    Refill:  0    COVID-19 Education: The signs and symptoms of COVID-19 were discussed with the patient and how to  seek care for testing (follow up with PCP or arrange E-visit). The importance of social distancing was discussed today.  Time:   Today, I have spent 10 minutes with the patient with telehealth technology discussing the above problems.    Follow Up:  Virtual Visit  prn  Signed, Blane Ohara, MD  07/20/2020 10:25 PM    [facility] [place]

## 2020-07-21 LAB — SARS-COV-2, NAA 2 DAY TAT

## 2020-07-21 LAB — NOVEL CORONAVIRUS, NAA: SARS-CoV-2, NAA: NOT DETECTED

## 2020-07-25 ENCOUNTER — Telehealth: Payer: Self-pay

## 2020-07-25 NOTE — Telephone Encounter (Signed)
Patient daughter notified of negtive covid results.

## 2020-07-26 ENCOUNTER — Other Ambulatory Visit: Payer: Self-pay | Admitting: Family Medicine

## 2020-07-26 DIAGNOSIS — S32010A Wedge compression fracture of first lumbar vertebra, initial encounter for closed fracture: Secondary | ICD-10-CM | POA: Diagnosis not present

## 2020-07-26 DIAGNOSIS — S22080A Wedge compression fracture of T11-T12 vertebra, initial encounter for closed fracture: Secondary | ICD-10-CM | POA: Diagnosis not present

## 2020-08-01 ENCOUNTER — Other Ambulatory Visit: Payer: Self-pay | Admitting: Family Medicine

## 2020-08-01 ENCOUNTER — Other Ambulatory Visit: Payer: Self-pay | Admitting: Physician Assistant

## 2020-08-08 ENCOUNTER — Telehealth: Payer: Self-pay

## 2020-08-08 ENCOUNTER — Other Ambulatory Visit: Payer: Self-pay | Admitting: Family Medicine

## 2020-08-08 DIAGNOSIS — S22080D Wedge compression fracture of T11-T12 vertebra, subsequent encounter for fracture with routine healing: Secondary | ICD-10-CM | POA: Diagnosis not present

## 2020-08-08 DIAGNOSIS — S22080A Wedge compression fracture of T11-T12 vertebra, initial encounter for closed fracture: Secondary | ICD-10-CM | POA: Diagnosis not present

## 2020-08-08 DIAGNOSIS — S22050A Wedge compression fracture of T5-T6 vertebra, initial encounter for closed fracture: Secondary | ICD-10-CM | POA: Diagnosis not present

## 2020-08-08 MED ORDER — OXYCODONE-ACETAMINOPHEN 5-325 MG PO TABS
1.0000 | ORAL_TABLET | Freq: Four times a day (QID) | ORAL | 0 refills | Status: DC | PRN
Start: 2020-08-08 — End: 2020-10-03

## 2020-08-08 NOTE — Telephone Encounter (Signed)
Stephanie Clay called to report that she completed the hydrocodone and she is questioning what you recommend at this time.  Dr. Sedalia Muta advised that she begin Percocet 1 every 6 hours to reduce to every 8 hours.  She was instructed not to take any additional Tylenol.

## 2020-08-09 ENCOUNTER — Ambulatory Visit: Payer: Medicare Other | Admitting: Rheumatology

## 2020-08-11 ENCOUNTER — Other Ambulatory Visit: Payer: Self-pay

## 2020-08-11 ENCOUNTER — Encounter: Payer: Self-pay | Admitting: Family Medicine

## 2020-08-11 ENCOUNTER — Ambulatory Visit (INDEPENDENT_AMBULATORY_CARE_PROVIDER_SITE_OTHER): Payer: Medicare Other | Admitting: Family Medicine

## 2020-08-11 VITALS — BP 140/100 | HR 104 | Temp 97.3°F | Resp 18 | Wt <= 1120 oz

## 2020-08-11 DIAGNOSIS — M79605 Pain in left leg: Secondary | ICD-10-CM

## 2020-08-11 DIAGNOSIS — M79604 Pain in right leg: Secondary | ICD-10-CM

## 2020-08-11 DIAGNOSIS — R531 Weakness: Secondary | ICD-10-CM

## 2020-08-11 DIAGNOSIS — E43 Unspecified severe protein-calorie malnutrition: Secondary | ICD-10-CM

## 2020-08-11 DIAGNOSIS — Z23 Encounter for immunization: Secondary | ICD-10-CM

## 2020-08-11 DIAGNOSIS — M2559 Pain in other specified joint: Secondary | ICD-10-CM | POA: Diagnosis not present

## 2020-08-11 DIAGNOSIS — R202 Paresthesia of skin: Secondary | ICD-10-CM

## 2020-08-11 MED ORDER — MEGESTROL ACETATE 20 MG PO TABS: 20.0000 mg | ORAL_TABLET | Freq: Every morning | ORAL | 0 refills | Status: DC

## 2020-08-11 NOTE — Progress Notes (Signed)
Subjective:  Patient ID: Stephanie Clay, female    DOB: 1932-01-28  Age: 86 y.o. MRN: [id number]  Chief Complaint  Patient presents with  . Weight Loss    HPI  Patient continues to have a poor appetite. Severe malnutrition ; Drinks 2-3 boosts per day.  Breakfast: oatmeal, toast. Or egg sandwich. Lunch: skips. (cookies.) Supper: sandwich in a restaurant. Frozen dinners.  Feels weak. Legs hurt.   Current Outpatient Medications on File Prior to Visit  Medication Sig Dispense Refill  . ALPRAZolam (XANAX) 0.5 MG tablet TAKE 1 TABLET BY MOUTH TWICE A DAY AS NEEDED 60 tablet 2  . Cholecalciferol (VITAMIN D3 PO) Take by mouth daily.    . Cyanocobalamin 2500 MCG TABS Take by mouth daily.    . diclofenac Sodium (VOLTAREN) 1 % GEL Apply topically 4 (four) times daily.    Marland Kitchen dicyclomine (BENTYL) 10 MG capsule TAKE 1 CAPSULE BY MOUTH BEFORE EACH MEAL AND AT BEDTIME AS NEEDED FOR COLON SPASM 120 capsule 0  . Ferrous Sulfate (IRON PO) Take by mouth daily.    . fluticasone (FLONASE) 50 MCG/ACT nasal spray Place into both nostrils daily.    . Fluticasone-Umeclidin-Vilant (TRELEGY ELLIPTA) 100-62.5-25 MCG/INH AEPB Inhale 1 puff into the lungs daily. 1 each 2  . gabapentin (NEURONTIN) 300 MG capsule Take 1 capsule (300 mg total) by mouth 3 (three) times daily. 90 capsule 3  . hydrochlorothiazide (HYDRODIURIL) 25 MG tablet TAKE 1 TABLET DAILY. 90 tablet 0  . HYDROcodone-acetaminophen (NORCO/VICODIN) 5-325 MG tablet TAKE 1 TABLET BY MOUTH IN THE MORNING, AT NOON, AND AT BEDTIME. 90 tablet 0  . hydrOXYzine (ATARAX/VISTARIL) 25 MG tablet Take 25 mg by mouth 3 (three) times daily as needed.    Marland Kitchen levothyroxine (SYNTHROID) 50 MCG tablet TAKE 1 TABLET BY MOUTH DAILY 90 tablet 1  . lidocaine (LIDODERM) 5 % Place 3 patches onto the skin every 12 (twelve) hours.    Marland Kitchen linaclotide (LINZESS) 72 MCG capsule Take 1 capsule (72 mcg total) by mouth daily before breakfast. 30 capsule 2  . meclizine  (ANTIVERT) 12.5 MG tablet TAKE 1 TABLET EVERY 8 HOURS AS NEEDED FOR DIZZINESS 40 tablet 0  . nitroGLYCERIN (NITROSTAT) 0.4 MG SL tablet Place 0.4 mg under the tongue every 5 (five) minutes as needed for chest pain. X 3 doses    . ondansetron (ZOFRAN) 4 MG tablet TAKE 1 TABLET BY MOUTH THREE TIMES A DAY FOR NAUSEA. 90 tablet 0  . oxyCODONE-acetaminophen (PERCOCET/ROXICET) 5-325 MG tablet Take 1 tablet by mouth every 6 (six) hours as needed for severe pain. 120 tablet 0  . pantoprazole (PROTONIX) 40 MG tablet TAKE 1 TABLET TWICE DAILY 180 tablet 0  . potassium chloride SA (KLOR-CON) 20 MEQ tablet TAKE 3 TABLETS BY MOUTH DAILY 90 tablet 3  . torsemide (DEMADEX) 20 MG tablet TAKE 1 TABLET BY MOUTH DAILY 5-6 HOURS AFTER FIRST DOSE OF HYDROCHLOROTHIAZIDE 90 tablet 2  . triamcinolone cream (KENALOG) 0.1 % Apply 1 application topically 2 (two) times daily as needed.     . venlafaxine XR (EFFEXOR-XR) 150 MG 24 hr capsule Take 1 capsule (150 mg total) by mouth daily with breakfast. 30 capsule 2   No current facility-administered medications on file prior to visit.   Past Medical History:  Diagnosis Date  . Anemia   . Aneurysm of other specified arteries (HCC)   . Anxiety   . Arthritis   . Asthma   . Bronchiectasis without acute exacerbation (HCC) 01/03/2015  CT chest 12/2014:  Bronchiectasis throughout right lung, left clear.  +tree and bud, + patchy gg infiltrates in RLL Sputum 2016: no growth, AFB smear neg PFT's 02/2015:  Normal except mild decrease in DLCO   . Cataract    bilateral eye surgery  . Chronic chest pain   . Chronic idiopathic constipation   . Chronic kidney disease, stage 3   . Chronic venous hypertension (idiopathic) with other complications of bilateral lower extremity   . Depression   . Gastro-esophageal reflux   . Heart murmur   . Hyperlipemia   . Hypertension   . Idiopathic progressive neuropathy   . Iron (Fe) deficiency anemia   . Moderate protein-calorie malnutrition  (HCC)   . Other idiopathic scoliosis, lumbar region   . Pneumathemia (HCC)   . Primary generalized (osteo)arthritis   . Primary insomnia   . Radiculopathy, lumbar region   . Scoliosis   . Secondary hyperparathyroidism (HCC)   . Thyroid disease   . Unilateral primary osteoarthritis, left knee   . Unilateral primary osteoarthritis, right knee   . Varicose veins    Bilateral leg  . Vitamin D deficiency, unspecified    Past Surgical History:  Procedure Laterality Date  . ABDOMINAL HYSTERECTOMY    . CYSTOCELE REPAIR    . SALPINGOOPHORECTOMY      Family History  Problem Relation Age of Onset  . Emphysema Father   . Heart disease Mother   . Cancer Sister        metastatic lung cancer  . Heart disease Sister   . Hypertension Sister   . Varicose Veins Sister   . Hyperlipidemia Daughter   . Hypertension Daughter   . Diabetes Daughter   . Colon cancer Neg Hx   . Esophageal cancer Neg Hx   . Stomach cancer Neg Hx    Social History   Socioeconomic History  . Marital status: Married    Spouse name: carl  . Number of children: 3  . Years of education: 9  . Highest education level: Not on file  Occupational History  . Occupation: retired  Tobacco Use  . Smoking status: Never Smoker  . Smokeless tobacco: Never Used  Vaping Use  . Vaping Use: Never used  Substance and Sexual Activity  . Alcohol use: No    Alcohol/week: 0.0 standard drinks  . Drug use: No  . Sexual activity: Not Currently  Other Topics Concern  . Not on file  Social History Narrative   Right handed   Social Determinants of Health   Financial Resource Strain:   . Difficulty of Paying Living Expenses: Not on file  Food Insecurity:   . Worried About Programme researcher, broadcasting/film/video in the Last Year: Not on file  . Ran Out of Food in the Last Year: Not on file  Transportation Needs:   . Lack of Transportation (Medical): Not on file  . Lack of Transportation (Non-Medical): Not on file  Physical Activity:   . Days  of Exercise per Week: Not on file  . Minutes of Exercise per Session: Not on file  Stress:   . Feeling of Stress : Not on file  Social Connections:   . Frequency of Communication with Friends and Family: Not on file  . Frequency of Social Gatherings with Friends and Family: Not on file  . Attends Religious Services: Not on file  . Active Member of Clubs or Organizations: Not on file  . Attends Banker Meetings: Not on  file  . Marital Status: Not on file    Review of Systems  Constitutional: Positive for fatigue. Negative for chills and fever.  HENT: Positive for ear pain. Negative for congestion, rhinorrhea and sore throat.   Respiratory: Negative for cough and shortness of breath.   Cardiovascular: Negative for chest pain, palpitations and leg swelling.  Gastrointestinal: Positive for constipation (improved with Linzess). Negative for abdominal pain, diarrhea, nausea and vomiting.  Genitourinary: Negative for dysuria and urgency.  Musculoskeletal: Negative for back pain (left ribcage pain ) and myalgias.  Skin: Positive for rash (burning sensation of her skin ).  Neurological: Positive for dizziness. Negative for weakness, light-headedness and headaches.  Psychiatric/Behavioral: Positive for dysphoric mood. The patient is not nervous/anxious.      Objective:  BP (!) 140/100   Pulse (!) 104   Temp (!) 97.3 F (36.3 C)   Resp 18   Wt 69 lb (31.3 kg)   BMI 13.94 kg/m   BP/Weight 08/11/2020 07/13/2020 06/15/2020  Systolic BP 140 - 120  Diastolic BP 100 - 64  Wt. (Lbs) 69 78.1 75.6  BMI 13.94 15.77 15.27    Physical Exam Vitals reviewed.  Constitutional:      Appearance: Normal appearance.     Comments: Thin.  Cardiovascular:     Rate and Rhythm: Normal rate and regular rhythm.     Pulses: Normal pulses.     Heart sounds: Normal heart sounds.  Pulmonary:     Effort: Pulmonary effort is normal. No respiratory distress.     Breath sounds: Normal breath  sounds.  Abdominal:     General: Abdomen is flat. Bowel sounds are normal.     Palpations: Abdomen is soft.     Tenderness: There is no abdominal tenderness.  Musculoskeletal:        General: No swelling or tenderness.     Right lower leg: No edema.     Left lower leg: No edema.  Skin:    Findings: No rash.  Neurological:     Mental Status: She is alert and oriented to person, place, and time.  Psychiatric:        Mood and Affect: Mood normal.        Behavior: Behavior normal.     Diabetic Foot Exam - Simple   No data filed       Lab Results  Component Value Date   WBC 8.8 08/11/2020   HGB 12.4 08/11/2020   HCT 36.8 08/11/2020   PLT 296 08/11/2020   GLUCOSE 90 08/11/2020   CHOL 194 03/08/2020   TRIG 90 03/08/2020   HDL 70 03/08/2020   LDLCALC 108 (H) 03/08/2020   ALT 10 08/11/2020   AST 25 08/11/2020   NA 140 08/11/2020   K 3.5 08/11/2020   CL 95 (L) 08/11/2020   CREATININE 0.96 08/11/2020   BUN 27 08/11/2020   CO2 30 (H) 08/11/2020   TSH 3.780 08/11/2020   HGBA1C 5.5 03/08/2020      Assessment & Plan:  1. Severe protein-calorie malnutrition (HCC)       Start on megace 20 mg once in am.  2. Pain in both lower extremities - CBC with Differential/Platelet - Comprehensive metabolic panel - TSH - Rheumatoid factor - Sedimentation Rate  3. Paresthesia - CBC with Differential/Platelet - Comprehensive metabolic panel - TSH - B12 and Folate Panel - Methylmalonic acid, serum  4. Pain in other joint - Rheumatoid factor - Sedimentation Rate  5. Weakness Recommend strengthening exercises.  6. Need for immunization against influenza - Flu Vaccine QUAD 36+ mos IM  Meds ordered this encounter  Medications  . megestrol (MEGACE) 20 MG tablet    Sig: Take 1 tablet (20 mg total) by mouth in the morning.    Dispense:  30 tablet    Refill:  0    Orders Placed This Encounter  Procedures  . Flu Vaccine QUAD 36+ mos IM  . CBC with Differential/Platelet    . Comprehensive metabolic panel  . TSH  . Rheumatoid factor  . Sedimentation Rate  . B12 and Folate Panel  . Methylmalonic acid(mma), rnd urine  . Methylmalonic acid, serum     Follow-up: Return in about 4 weeks (around 09/08/2020).  An After Visit Summary was printed and given to the patient.  Blane Ohara [facility] [phone]

## 2020-08-12 LAB — CBC WITH DIFFERENTIAL/PLATELET
Basophils Absolute: 0 10*3/uL (ref 0.0–0.2)
Basos: 1 %
EOS (ABSOLUTE): 0.1 10*3/uL (ref 0.0–0.4)
Eos: 2 %
Hematocrit: 36.8 % (ref 34.0–46.6)
Hemoglobin: 12.4 g/dL (ref 11.1–15.9)
Immature Grans (Abs): 0 10*3/uL (ref 0.0–0.1)
Immature Granulocytes: 0 %
Lymphocytes Absolute: 1.7 10*3/uL (ref 0.7–3.1)
Lymphs: 20 %
MCH: 31.1 pg (ref 26.6–33.0)
MCHC: 33.7 g/dL (ref 31.5–35.7)
MCV: 92 fL (ref 79–97)
Monocytes Absolute: 0.7 10*3/uL (ref 0.1–0.9)
Monocytes: 8 %
Neutrophils Absolute: 6.2 10*3/uL (ref 1.4–7.0)
Neutrophils: 69 %
Platelets: 296 10*3/uL (ref 150–450)
RBC: 3.99 x10E6/uL (ref 3.77–5.28)
RDW: 11.8 % (ref 11.7–15.4)
WBC: 8.8 10*3/uL (ref 3.4–10.8)

## 2020-08-12 LAB — COMPREHENSIVE METABOLIC PANEL
ALT: 10 IU/L (ref 0–32)
AST: 25 IU/L (ref 0–40)
Albumin/Globulin Ratio: 1.3 (ref 1.2–2.2)
Albumin: 4.6 g/dL (ref 3.6–4.6)
Alkaline Phosphatase: 93 IU/L (ref 44–121)
BUN/Creatinine Ratio: 28 (ref 12–28)
BUN: 27 mg/dL (ref 8–27)
Bilirubin Total: 0.2 mg/dL (ref 0.0–1.2)
CO2: 30 mmol/L — ABNORMAL HIGH (ref 20–29)
Calcium: 10.4 mg/dL — ABNORMAL HIGH (ref 8.7–10.3)
Chloride: 95 mmol/L — ABNORMAL LOW (ref 96–106)
Creatinine, Ser: 0.96 mg/dL (ref 0.57–1.00)
GFR calc Af Amer: 62 mL/min/{1.73_m2} (ref >59)
GFR calc non Af Amer: 54 mL/min/{1.73_m2} — ABNORMAL LOW (ref >59)
Globulin, Total: 3.6 g/dL (ref 1.5–4.5)
Glucose: 90 mg/dL (ref 65–99)
Potassium: 3.5 mmol/L (ref 3.5–5.2)
Sodium: 140 mmol/L (ref 134–144)
Total Protein: 8.2 g/dL (ref 6.0–8.5)

## 2020-08-12 LAB — B12 AND FOLATE PANEL
Folate: >20 ng/mL (ref >3.0)
Vitamin B-12: >2000 pg/mL — ABNORMAL HIGH (ref 232–1245)

## 2020-08-12 LAB — TSH: TSH: 3.78 u[IU]/mL (ref 0.450–4.500)

## 2020-08-12 LAB — RHEUMATOID FACTOR: Rheumatoid fact SerPl-aCnc: 15.9 IU/mL — ABNORMAL HIGH (ref 0.0–13.9)

## 2020-08-12 LAB — SEDIMENTATION RATE: Sed Rate: 40 mm/hr (ref 0–40)

## 2020-08-15 LAB — METHYLMALONIC ACID, SERUM: Methylmalonic Acid: 283 nmol/L (ref 0–378)

## 2020-08-22 DIAGNOSIS — S22080A Wedge compression fracture of T11-T12 vertebra, initial encounter for closed fracture: Secondary | ICD-10-CM | POA: Diagnosis not present

## 2020-08-22 DIAGNOSIS — S22050A Wedge compression fracture of T5-T6 vertebra, initial encounter for closed fracture: Secondary | ICD-10-CM | POA: Diagnosis not present

## 2020-08-25 ENCOUNTER — Encounter: Payer: Self-pay | Admitting: Family Medicine

## 2020-08-29 ENCOUNTER — Other Ambulatory Visit: Payer: Self-pay | Admitting: Physician Assistant

## 2020-08-29 ENCOUNTER — Other Ambulatory Visit: Payer: Self-pay | Admitting: Family Medicine

## 2020-09-01 ENCOUNTER — Other Ambulatory Visit: Payer: Self-pay | Admitting: Physician Assistant

## 2020-09-01 ENCOUNTER — Other Ambulatory Visit: Payer: Self-pay | Admitting: Family Medicine

## 2020-09-09 ENCOUNTER — Ambulatory Visit (INDEPENDENT_AMBULATORY_CARE_PROVIDER_SITE_OTHER): Payer: Medicare Other | Admitting: Family Medicine

## 2020-09-09 ENCOUNTER — Encounter: Payer: Self-pay | Admitting: Family Medicine

## 2020-09-09 ENCOUNTER — Other Ambulatory Visit: Payer: Self-pay

## 2020-09-09 VITALS — BP 118/66 | HR 67 | Temp 96.7°F | Ht 59.0 in | Wt 70.6 lb

## 2020-09-09 DIAGNOSIS — K5904 Chronic idiopathic constipation: Secondary | ICD-10-CM | POA: Diagnosis not present

## 2020-09-09 DIAGNOSIS — E782 Mixed hyperlipidemia: Secondary | ICD-10-CM | POA: Diagnosis not present

## 2020-09-09 DIAGNOSIS — E43 Unspecified severe protein-calorie malnutrition: Secondary | ICD-10-CM

## 2020-09-09 DIAGNOSIS — Z01818 Encounter for other preprocedural examination: Secondary | ICD-10-CM | POA: Diagnosis not present

## 2020-09-09 DIAGNOSIS — I10 Essential (primary) hypertension: Secondary | ICD-10-CM

## 2020-09-09 MED ORDER — LINACLOTIDE 145 MCG PO CAPS: 145.0000 ug | ORAL_CAPSULE | Freq: Every day | ORAL | 1 refills | Status: DC

## 2020-09-09 NOTE — Progress Notes (Signed)
Subjective:  Patient ID: Stephanie Clay, female    DOB: May 16, 1932  Age: 86 y.o. MRN: [id number]  Chief Complaint  Patient presents with  . Weight Loss    HPI  Abnormal weight loss: Patient's weight has remained stable since her last visit.  Patient needs a COVID-19 PCR test today.  She is scheduled to get a kyphoplasty on September 19, 2020.  This is for a compression fracture which she had.  Patient is currently taking Percocet once daily.  Patient is complaining of continued constipation despite Linzess 75 mg once daily.  Prior to going on for the Percocet due to the fracture she was on hydrocodone 3 times a day.  This makes it unclear whether not this is related to her opioids.  Patient reports that she alternates between being very hot and very cold at nighttime.  She broke out into a sweat at times.  She does adjust covers as needed.  This is been going on for some time.  Hyperlipidemia: I discontinued the patient's Zocor approximately 6 months ago.  Patient has continued to complain about leg pain for truly as long as I can remember, however she is also been on Zocor since I began taking care of her.  As such I felt it was worth discontinuing willing to try and remedy the symptoms.  Unfortunately she continues to have leg pain despite discontinuation of Zocor.  Hypertension: Blood pressure is well controlled on medications.  Currently on only on hydrochlorothiazide.  Patient has no swelling in her lower extremities which she does intermittently abnormally.  Current Outpatient Medications on File Prior to Visit  Medication Sig Dispense Refill  . ALPRAZolam (XANAX) 0.5 MG tablet TAKE 1 TABLET BY MOUTH TWICE A DAY AS NEEDED 60 tablet 2  . Cholecalciferol (VITAMIN D3 PO) Take by mouth daily.    . Cyanocobalamin 2500 MCG TABS Take by mouth daily.    . diclofenac Sodium (VOLTAREN) 1 % GEL Apply topically 4 (four) times daily.    Marland Kitchen. dicyclomine (BENTYL) 10 MG capsule TAKE 1  CAPSULE BY MOUTH BEFORE EACH MEAL AND AT BEDTIME AS NEEDED FOR COLON SPASM 120 capsule 0  . Ferrous Sulfate (IRON PO) Take by mouth daily.    . fluticasone (FLONASE) 50 MCG/ACT nasal spray Place into both nostrils daily.    . Fluticasone-Umeclidin-Vilant (TRELEGY ELLIPTA) 100-62.5-25 MCG/INH AEPB Inhale 1 puff into the lungs daily. 1 each 2  . gabapentin (NEURONTIN) 300 MG capsule Take 1 capsule (300 mg total) by mouth 3 (three) times daily. 90 capsule 3  . hydrochlorothiazide (HYDRODIURIL) 25 MG tablet TAKE 1 TABLET BY MOUTH DAILY 90 tablet 0  . HYDROcodone-acetaminophen (NORCO/VICODIN) 5-325 MG tablet TAKE 1 TABLET BY MOUTH IN THE MORNING, AT NOON, AND AT BEDTIME. 90 tablet 0  . hydrOXYzine (ATARAX/VISTARIL) 25 MG tablet Take 25 mg by mouth 3 (three) times daily as needed.    Marland Kitchen. levothyroxine (SYNTHROID) 50 MCG tablet TAKE 1 TABLET BY MOUTH DAILY 90 tablet 1  . lidocaine (LIDODERM) 5 % Place 3 patches onto the skin every 12 (twelve) hours.    . meclizine (ANTIVERT) 12.5 MG tablet TAKE 1 TABLET EVERY 8 HOURS AS NEEDED FOR DIZZINESS 40 tablet 0  . megestrol (MEGACE) 20 MG tablet TAKE 1 TABLET BY MOUTH IN THE MORNING. 30 tablet 0  . nitroGLYCERIN (NITROSTAT) 0.4 MG SL tablet Place 0.4 mg under the tongue every 5 (five) minutes as needed for chest pain. X 3 doses    .  ondansetron (ZOFRAN) 4 MG tablet TAKE 1 TABLET BY MOUTH THREE TIMES A DAY FOR NAUSEA. 90 tablet 0  . oxyCODONE-acetaminophen (PERCOCET/ROXICET) 5-325 MG tablet Take 1 tablet by mouth every 6 (six) hours as needed for severe pain. 120 tablet 0  . pantoprazole (PROTONIX) 40 MG tablet TAKE 1 TABLET TWICE DAILY 180 tablet 1  . potassium chloride SA (KLOR-CON) 20 MEQ tablet TAKE 3 TABLETS BY MOUTH DAILY 90 tablet 2  . torsemide (DEMADEX) 20 MG tablet TAKE 1 TABLET BY MOUTH DAILY 5-6 HOURS AFTER FIRST DOSE OF HYDROCHLOROTHIAZIDE 90 tablet 2  . triamcinolone cream (KENALOG) 0.1 % Apply 1 application topically 2 (two) times daily as needed.       . venlafaxine XR (EFFEXOR-XR) 150 MG 24 hr capsule Take 1 capsule (150 mg total) by mouth daily with breakfast. 30 capsule 2   No current facility-administered medications on file prior to visit.   Past Medical History:  Diagnosis Date  . Anemia   . Aneurysm of other specified arteries (HCC)   . Anxiety   . Arthritis   . Asthma   . Bronchiectasis without acute exacerbation (HCC) 01/03/2015   CT chest 12/2014:  Bronchiectasis throughout right lung, left clear.  +tree and bud, + patchy gg infiltrates in RLL Sputum 2016: no growth, AFB smear neg PFT's 02/2015:  Normal except mild decrease in DLCO   . Cataract    bilateral eye surgery  . Chronic chest pain   . Chronic idiopathic constipation   . Chronic kidney disease, stage 3 (HCC)   . Chronic venous hypertension (idiopathic) with other complications of bilateral lower extremity   . Depression   . Gastro-esophageal reflux   . Heart murmur   . Hyperlipemia   . Hypertension   . Idiopathic progressive neuropathy   . Iron (Fe) deficiency anemia   . Moderate protein-calorie malnutrition (HCC)   . Other idiopathic scoliosis, lumbar region   . Pneumathemia (HCC)   . Primary generalized (osteo)arthritis   . Primary insomnia   . Radiculopathy, lumbar region   . Scoliosis   . Secondary hyperparathyroidism (HCC)   . Thyroid disease   . Unilateral primary osteoarthritis, left knee   . Unilateral primary osteoarthritis, right knee   . Varicose veins    Bilateral leg  . Vitamin D deficiency, unspecified    Past Surgical History:  Procedure Laterality Date  . ABDOMINAL HYSTERECTOMY    . CYSTOCELE REPAIR    . SALPINGOOPHORECTOMY      Family History  Problem Relation Age of Onset  . Emphysema Father   . Heart disease Mother   . Cancer Sister        metastatic lung cancer  . Heart disease Sister   . Hypertension Sister   . Varicose Veins Sister   . Hyperlipidemia Daughter   . Hypertension Daughter   . Diabetes Daughter   . Colon  cancer Neg Hx   . Esophageal cancer Neg Hx   . Stomach cancer Neg Hx    Social History   Socioeconomic History  . Marital status: Married    Spouse name: carl  . Number of children: 3  . Years of education: 9  . Highest education level: Not on file  Occupational History  . Occupation: retired  Tobacco Use  . Smoking status: Never Smoker  . Smokeless tobacco: Never Used  Vaping Use  . Vaping Use: Never used  Substance and Sexual Activity  . Alcohol use: No    Alcohol/week: 0.0  standard drinks  . Drug use: No  . Sexual activity: Not Currently  Other Topics Concern  . Not on file  Social History Narrative   Right handed   Social Determinants of Health   Financial Resource Strain:   . Difficulty of Paying Living Expenses: Not on file  Food Insecurity:   . Worried About Programme researcher, broadcasting/film/video in the Last Year: Not on file  . Ran Out of Food in the Last Year: Not on file  Transportation Needs:   . Lack of Transportation (Medical): Not on file  . Lack of Transportation (Non-Medical): Not on file  Physical Activity:   . Days of Exercise per Week: Not on file  . Minutes of Exercise per Session: Not on file  Stress:   . Feeling of Stress : Not on file  Social Connections:   . Frequency of Communication with Friends and Family: Not on file  . Frequency of Social Gatherings with Friends and Family: Not on file  . Attends Religious Services: Not on file  . Active Member of Clubs or Organizations: Not on file  . Attends Banker Meetings: Not on file  . Marital Status: Not on file    Review of Systems  Constitutional: Positive for diaphoresis. Negative for chills, fatigue and fever.  HENT: Negative for congestion, ear pain, rhinorrhea and sore throat.   Respiratory: Negative for cough and shortness of breath.   Cardiovascular: Negative for chest pain.  Gastrointestinal: Positive for abdominal pain and constipation. Negative for diarrhea, nausea and vomiting.    Genitourinary: Negative for dysuria and urgency.  Musculoskeletal: Negative for back pain and myalgias.  Neurological: Negative for dizziness, weakness and headaches.  Psychiatric/Behavioral: Negative for dysphoric mood. The patient is not nervous/anxious.      Objective:  BP 118/66   Pulse 67   Temp (!) 96.7 F (35.9 C)   Ht 4\' 11"  (1.499 m)   Wt 70 lb 9.6 oz (32 kg)   SpO2 97%   BMI 14.26 kg/m   BP/Weight 09/09/2020 08/11/2020 07/13/2020  Systolic BP 118 140 -  Diastolic BP 66 100 -  Wt. (Lbs) 70.6 69 78.1  BMI 14.26 13.94 15.77    Physical Exam Vitals reviewed.  Constitutional:      Appearance: Normal appearance.     Comments: Thin.  Neck:     Vascular: No carotid bruit.  Cardiovascular:     Rate and Rhythm: Normal rate and regular rhythm.     Pulses: Normal pulses.     Heart sounds: Normal heart sounds.  Pulmonary:     Effort: Pulmonary effort is normal. No respiratory distress.     Breath sounds: Normal breath sounds.  Abdominal:     General: Abdomen is flat. Bowel sounds are normal.     Palpations: Abdomen is soft.     Tenderness: There is no abdominal tenderness.  Neurological:     Mental Status: She is alert and oriented to person, place, and time.  Psychiatric:        Mood and Affect: Mood normal.        Behavior: Behavior normal.     Diabetic Foot Exam - Simple   No data filed       Lab Results  Component Value Date   WBC 8.8 08/11/2020   HGB 12.4 08/11/2020   HCT 36.8 08/11/2020   PLT 296 08/11/2020   GLUCOSE 90 08/11/2020   CHOL 194 03/08/2020   TRIG 90 03/08/2020  HDL 70 03/08/2020   LDLCALC 108 (H) 03/08/2020   ALT 10 08/11/2020   AST 25 08/11/2020   NA 140 08/11/2020   K 3.5 08/11/2020   CL 95 (L) 08/11/2020   CREATININE 0.96 08/11/2020   BUN 27 08/11/2020   CO2 30 (H) 08/11/2020   TSH 3.780 08/11/2020   HGBA1C 5.5 03/08/2020      Assessment & Plan:   1. Mixed hyperlipidemia Checking cholesterol today to determine if  needs a statin medication.  Patient does not exercise very much due to her high risk of falls. - Lipid panel  2. Essential hypertension, benign The current medical regimen is effective;  continue present plan and medications. Check labs - CBC with Differential/Platelet - Comprehensive metabolic panel  3. Preoperative testing - Novel Coronavirus, NAA ([facility])  4. Severe protein-calorie malnutrition (HCC) Continue to encourage 3 meals a day.  Recommended continue protein drinks.  5.  Chronic idiopathic constipation Recommend Linzess increased to 145 mcg daily.   Meds ordered this encounter  Medications  . linaclotide (LINZESS) 145 MCG CAPS capsule    Sig: Take 1 capsule (145 mcg total) by mouth daily before breakfast.    Dispense:  90 capsule    Refill:  1    Orders Placed This Encounter  Procedures  . Novel Coronavirus, NAA ([facility])  . CBC with Differential/Platelet  . Lipid panel  . Comprehensive metabolic panel     I spent 20 minutes dedicated to the care of this patient on the date of this encounter to include face-to-face time with the patient, as well as reviewing lab work, weights.  Discussed fluid intake.  Discussion about possibly restarting statin medicine.  Covid testing for kyphoplasty.  Follow-up: Return in about 3 months (around 12/10/2020).  An After Visit Summary was printed and given to the patient.  Blane Ohara, MD [facility] [phone]

## 2020-09-10 LAB — COMPREHENSIVE METABOLIC PANEL
ALT: 12 IU/L (ref 0–32)
AST: 19 IU/L (ref 0–40)
Albumin/Globulin Ratio: 1.2 (ref 1.2–2.2)
Albumin: 4.2 g/dL (ref 3.6–4.6)
Alkaline Phosphatase: 77 IU/L (ref 44–121)
BUN/Creatinine Ratio: 27 (ref 12–28)
BUN: 28 mg/dL — ABNORMAL HIGH (ref 8–27)
Bilirubin Total: 0.3 mg/dL (ref 0.0–1.2)
CO2: 23 mmol/L (ref 20–29)
Calcium: 10 mg/dL (ref 8.7–10.3)
Chloride: 102 mmol/L (ref 96–106)
Creatinine, Ser: 1.02 mg/dL — ABNORMAL HIGH (ref 0.57–1.00)
GFR calc Af Amer: 58 mL/min/{1.73_m2} — ABNORMAL LOW (ref >59)
GFR calc non Af Amer: 50 mL/min/{1.73_m2} — ABNORMAL LOW (ref >59)
Globulin, Total: 3.5 g/dL (ref 1.5–4.5)
Glucose: 82 mg/dL (ref 65–99)
Potassium: 4.6 mmol/L (ref 3.5–5.2)
Sodium: 142 mmol/L (ref 134–144)
Total Protein: 7.7 g/dL (ref 6.0–8.5)

## 2020-09-10 LAB — CBC WITH DIFFERENTIAL/PLATELET
Basophils Absolute: 0.1 10*3/uL (ref 0.0–0.2)
Basos: 1 %
EOS (ABSOLUTE): 0.5 10*3/uL — ABNORMAL HIGH (ref 0.0–0.4)
Eos: 4 %
Hematocrit: 32.5 % — ABNORMAL LOW (ref 34.0–46.6)
Hemoglobin: 11.2 g/dL (ref 11.1–15.9)
Immature Grans (Abs): 0 10*3/uL (ref 0.0–0.1)
Immature Granulocytes: 0 %
Lymphocytes Absolute: 2.1 10*3/uL (ref 0.7–3.1)
Lymphs: 16 %
MCH: 31.3 pg (ref 26.6–33.0)
MCHC: 34.5 g/dL (ref 31.5–35.7)
MCV: 91 fL (ref 79–97)
Monocytes Absolute: 1.1 10*3/uL — ABNORMAL HIGH (ref 0.1–0.9)
Monocytes: 9 %
Neutrophils Absolute: 8.9 10*3/uL — ABNORMAL HIGH (ref 1.4–7.0)
Neutrophils: 70 %
Platelets: 345 10*3/uL (ref 150–450)
RBC: 3.58 x10E6/uL — ABNORMAL LOW (ref 3.77–5.28)
RDW: 12.1 % (ref 11.7–15.4)
WBC: 12.7 10*3/uL — ABNORMAL HIGH (ref 3.4–10.8)

## 2020-09-10 LAB — LIPID PANEL
Chol/HDL Ratio: 3.3 ratio (ref 0.0–4.4)
Cholesterol, Total: 193 mg/dL (ref 100–199)
HDL: 58 mg/dL (ref >39)
LDL Chol Calc (NIH): 122 mg/dL — ABNORMAL HIGH (ref 0–99)
Triglycerides: 71 mg/dL (ref 0–149)
VLDL Cholesterol Cal: 13 mg/dL (ref 5–40)

## 2020-09-10 LAB — SARS-COV-2, NAA 2 DAY TAT

## 2020-09-10 LAB — CARDIOVASCULAR RISK ASSESSMENT

## 2020-09-10 LAB — NOVEL CORONAVIRUS, NAA: SARS-CoV-2, NAA: NOT DETECTED

## 2020-09-13 ENCOUNTER — Other Ambulatory Visit: Payer: Self-pay

## 2020-09-13 DIAGNOSIS — D72829 Elevated white blood cell count, unspecified: Secondary | ICD-10-CM

## 2020-09-14 ENCOUNTER — Other Ambulatory Visit: Payer: Medicare Other

## 2020-09-14 DIAGNOSIS — D72829 Elevated white blood cell count, unspecified: Secondary | ICD-10-CM

## 2020-09-14 LAB — CBC
Hematocrit: 34.1 % (ref 34.0–46.6)
Hemoglobin: 11.6 g/dL (ref 11.1–15.9)
MCH: 30.6 pg (ref 26.6–33.0)
MCHC: 34 g/dL (ref 31.5–35.7)
MCV: 90 fL (ref 79–97)
Platelets: 341 10*3/uL (ref 150–450)
RBC: 3.79 x10E6/uL (ref 3.77–5.28)
RDW: 12 % (ref 11.7–15.4)
WBC: 13.8 10*3/uL — ABNORMAL HIGH (ref 3.4–10.8)

## 2020-09-19 ENCOUNTER — Other Ambulatory Visit: Payer: Self-pay

## 2020-09-19 DIAGNOSIS — M898X8 Other specified disorders of bone, other site: Secondary | ICD-10-CM | POA: Diagnosis not present

## 2020-09-19 DIAGNOSIS — S22080A Wedge compression fracture of T11-T12 vertebra, initial encounter for closed fracture: Secondary | ICD-10-CM | POA: Diagnosis not present

## 2020-09-19 DIAGNOSIS — M4854XA Collapsed vertebra, not elsewhere classified, thoracic region, initial encounter for fracture: Secondary | ICD-10-CM | POA: Diagnosis not present

## 2020-09-20 ENCOUNTER — Other Ambulatory Visit: Payer: Self-pay

## 2020-09-20 MED ORDER — SIMVASTATIN 40 MG PO TABS
40.0000 mg | ORAL_TABLET | Freq: Every day | ORAL | 2 refills | Status: DC
Start: 2020-09-20 — End: 2020-12-07

## 2020-09-23 ENCOUNTER — Other Ambulatory Visit: Payer: Self-pay | Admitting: Physician Assistant

## 2020-09-27 ENCOUNTER — Telehealth (INDEPENDENT_AMBULATORY_CARE_PROVIDER_SITE_OTHER): Payer: Medicare Other | Admitting: Family Medicine

## 2020-09-27 DIAGNOSIS — R195 Other fecal abnormalities: Secondary | ICD-10-CM

## 2020-09-27 DIAGNOSIS — R5383 Other fatigue: Secondary | ICD-10-CM

## 2020-09-27 DIAGNOSIS — R430 Anosmia: Secondary | ICD-10-CM | POA: Diagnosis not present

## 2020-09-27 DIAGNOSIS — R1084 Generalized abdominal pain: Secondary | ICD-10-CM | POA: Diagnosis not present

## 2020-09-27 NOTE — Progress Notes (Unsigned)
Virtual Visit via Telephone Note   This visit type was conducted due to national recommendations for restrictions regarding the COVID-19 Pandemic (e.g. social distancing) in an effort to limit this patient's exposure and mitigate transmission in our community.  Due to her co-morbid illnesses, this patient is at least at moderate risk for complications without adequate follow up.  This format is felt to be most appropriate for this patient at this time.  The patient did not have access to video technology/had technical difficulties with video requiring transitioning to audio format only (telephone).  All issues noted in this document were discussed and addressed.  No physical exam could be performed with this format.  Patient verbally consented to a telehealth visit.   Date:  09/28/2020   ID:  Stephanie Clay, DOB October 26, 1931, MRN [id number]  Patient Location: Home Provider Location: Office/Clinic  PCP:  Blane Ohara, MD   Evaluation Performed: acute visit Chief Complaint:  Abdominal pain  History of Present Illness:    Stephanie Clay is a 86 y.o. female who states she started feeling these symptoms since last week. She still has some back in "the old places" not where she recently had surgery.   Complaining of abdominal pain for an unknown amount of time. It hurts all over. Last BM was yesterday. Small amounts. Has to take a linzess and prune juice. Relieves abdomin pain somewhat. mucousy stool. No diarrhea. No vomiting. Has some nausea.   Severe fatigue. Runny nose, congestion, earaches, sinus pain. Poor smell over the weekend.   Last wbc was elevated at 13.8.   CTA of abdomen/pelvis - normal in 2019. EGD showed a stricture which he dilated. This improved her symptoms.   Past Medical History:  Diagnosis Date  . Anemia   . Aneurysm of other specified arteries (HCC)   . Anxiety   . Arthritis   . Asthma   . Bronchiectasis without acute exacerbation (HCC)  01/03/2015   CT chest 12/2014:  Bronchiectasis throughout right lung, left clear.  +tree and bud, + patchy gg infiltrates in RLL Sputum 2016: no growth, AFB smear neg PFT's 02/2015:  Normal except mild decrease in DLCO   . Cataract    bilateral eye surgery  . Chronic chest pain   . Chronic idiopathic constipation   . Chronic kidney disease, stage 3 (HCC)   . Chronic venous hypertension (idiopathic) with other complications of bilateral lower extremity   . Depression   . Gastro-esophageal reflux   . Heart murmur   . Hyperlipemia   . Hypertension   . Idiopathic progressive neuropathy   . Iron (Fe) deficiency anemia   . Moderate protein-calorie malnutrition (HCC)   . Other idiopathic scoliosis, lumbar region   . Pneumathemia (HCC)   . Primary generalized (osteo)arthritis   . Primary insomnia   . Radiculopathy, lumbar region   . Scoliosis   . Secondary hyperparathyroidism (HCC)   . Thyroid disease   . Unilateral primary osteoarthritis, left knee   . Unilateral primary osteoarthritis, right knee   . Varicose veins    Bilateral leg  . Vitamin D deficiency, unspecified     Past Surgical History:  Procedure Laterality Date  . ABDOMINAL HYSTERECTOMY    . CYSTOCELE REPAIR    . SALPINGOOPHORECTOMY      Family History  Problem Relation Age of Onset  . Emphysema Father   . Heart disease Mother   . Cancer Sister        metastatic lung  cancer  . Heart disease Sister   . Hypertension Sister   . Varicose Veins Sister   . Hyperlipidemia Daughter   . Hypertension Daughter   . Diabetes Daughter   . Colon cancer Neg Hx   . Esophageal cancer Neg Hx   . Stomach cancer Neg Hx     Social History   Socioeconomic History  . Marital status: Married    Spouse name: carl  . Number of children: 3  . Years of education: 9  . Highest education level: Not on file  Occupational History  . Occupation: retired  Tobacco Use  . Smoking status: Never Smoker  . Smokeless tobacco: Never Used   Vaping Use  . Vaping Use: Never used  Substance and Sexual Activity  . Alcohol use: No    Alcohol/week: 0.0 standard drinks  . Drug use: No  . Sexual activity: Not Currently  Other Topics Concern  . Not on file  Social History Narrative   Right handed   Social Determinants of Health   Financial Resource Strain:   . Difficulty of Paying Living Expenses: Not on file  Food Insecurity:   . Worried About Programme researcher, broadcasting/film/video in the Last Year: Not on file  . Ran Out of Food in the Last Year: Not on file  Transportation Needs:   . Lack of Transportation (Medical): Not on file  . Lack of Transportation (Non-Medical): Not on file  Physical Activity:   . Days of Exercise per Week: Not on file  . Minutes of Exercise per Session: Not on file  Stress:   . Feeling of Stress : Not on file  Social Connections:   . Frequency of Communication with Friends and Family: Not on file  . Frequency of Social Gatherings with Friends and Family: Not on file  . Attends Religious Services: Not on file  . Active Member of Clubs or Organizations: Not on file  . Attends Banker Meetings: Not on file  . Marital Status: Not on file  Intimate Partner Violence:   . Fear of Current or Ex-Partner: Not on file  . Emotionally Abused: Not on file  . Physically Abused: Not on file  . Sexually Abused: Not on file    Outpatient Medications Prior to Visit  Medication Sig Dispense Refill  . ALPRAZolam (XANAX) 0.5 MG tablet TAKE 1 TABLET BY MOUTH TWICE A DAY AS NEEDED 60 tablet 2  . Cholecalciferol (VITAMIN D3 PO) Take by mouth daily.    . Cyanocobalamin 2500 MCG TABS Take by mouth daily.    . diclofenac Sodium (VOLTAREN) 1 % GEL Apply topically 4 (four) times daily.    Marland Kitchen dicyclomine (BENTYL) 10 MG capsule TAKE 1 CAPSULE BY MOUTH BEFORE EACH MEAL AND AT BEDTIME AS NEEDED FOR COLON SPASM 120 capsule 0  . Ferrous Sulfate (IRON PO) Take by mouth daily.    . fluticasone (FLONASE) 50 MCG/ACT nasal spray  Place into both nostrils daily.    . Fluticasone-Umeclidin-Vilant (TRELEGY ELLIPTA) 100-62.5-25 MCG/INH AEPB Inhale 1 puff into the lungs daily. 1 each 2  . gabapentin (NEURONTIN) 300 MG capsule Take 1 capsule (300 mg total) by mouth 3 (three) times daily. 90 capsule 3  . hydrochlorothiazide (HYDRODIURIL) 25 MG tablet TAKE 1 TABLET BY MOUTH DAILY 90 tablet 0  . HYDROcodone-acetaminophen (NORCO/VICODIN) 5-325 MG tablet TAKE 1 TABLET BY MOUTH IN THE MORNING, AT NOON, AND AT BEDTIME. 90 tablet 0  . hydrOXYzine (ATARAX/VISTARIL) 25 MG tablet Take 25 mg  by mouth 3 (three) times daily as needed.    Marland Kitchen. levothyroxine (SYNTHROID) 50 MCG tablet TAKE 1 TABLET BY MOUTH DAILY 90 tablet 1  . lidocaine (LIDODERM) 5 % Place 3 patches onto the skin every 12 (twelve) hours.    Marland Kitchen. linaclotide (LINZESS) 145 MCG CAPS capsule Take 1 capsule (145 mcg total) by mouth daily before breakfast. 90 capsule 1  . meclizine (ANTIVERT) 12.5 MG tablet TAKE 1 TABLET EVERY 8 HOURS AS NEEDED FOR DIZZINESS 40 tablet 0  . megestrol (MEGACE) 20 MG tablet TAKE 1 TABLET BY MOUTH IN THE MORNING. 30 tablet 0  . nitroGLYCERIN (NITROSTAT) 0.4 MG SL tablet Place 0.4 mg under the tongue every 5 (five) minutes as needed for chest pain. X 3 doses    . ondansetron (ZOFRAN) 4 MG tablet TAKE 1 TABLET BY MOUTH THREE TIMES A DAY FOR NAUSEA. 90 tablet 0  . oxyCODONE-acetaminophen (PERCOCET/ROXICET) 5-325 MG tablet Take 1 tablet by mouth every 6 (six) hours as needed for severe pain. 120 tablet 0  . pantoprazole (PROTONIX) 40 MG tablet TAKE 1 TABLET TWICE DAILY 180 tablet 1  . potassium chloride SA (KLOR-CON) 20 MEQ tablet TAKE 3 TABLETS BY MOUTH DAILY 90 tablet 2  . simvastatin (ZOCOR) 40 MG tablet Take 1 tablet (40 mg total) by mouth daily. 30 tablet 2  . torsemide (DEMADEX) 20 MG tablet TAKE 1 TABLET BY MOUTH DAILY 5-6 HOURS AFTER FIRST DOSE OF HYDROCHLOROTHIAZIDE 90 tablet 2  . triamcinolone cream (KENALOG) 0.1 % Apply 1 application topically 2 (two)  times daily as needed.     . venlafaxine XR (EFFEXOR-XR) 150 MG 24 hr capsule Take 1 capsule (150 mg total) by mouth daily with breakfast. 30 capsule 2   No facility-administered medications prior to visit.    Allergies:   Lisinopril, Remeron [mirtazapine], Ciprofloxacin, Levofloxacin, Nitrofurantoin, Omnicef [cefdinir], and Sertraline hcl   Social History   Tobacco Use  . Smoking status: Never Smoker  . Smokeless tobacco: Never Used  Vaping Use  . Vaping Use: Never used  Substance Use Topics  . Alcohol use: No    Alcohol/week: 0.0 standard drinks  . Drug use: No     Review of Systems  Constitutional: Positive for chills. Negative for fever.  HENT: Positive for congestion, ear pain and sinus pain.        Rhinorrhea   Respiratory: Positive for cough. Negative for shortness of breath.   Cardiovascular: Negative for chest pain.  Gastrointestinal: Positive for nausea. Negative for vomiting.  Musculoskeletal: Positive for joint pain and myalgias.  Neurological: Positive for headaches.     Labs/Other Tests and Data Reviewed:    Recent Labs: 08/11/2020: TSH 3.780 09/09/2020: ALT 12; BUN 28; Creatinine, Ser 1.02; Potassium 4.6; Sodium 142 09/14/2020: Hemoglobin 11.6; Platelets 341   Recent Lipid Panel Lab Results  Component Value Date/Time   CHOL 193 09/09/2020 11:56 AM   TRIG 71 09/09/2020 11:56 AM   HDL 58 09/09/2020 11:56 AM   CHOLHDL 3.3 09/09/2020 11:56 AM   LDLCALC 122 (H) 09/09/2020 11:56 AM    Wt Readings from Last 3 Encounters:  09/09/20 70 lb 9.6 oz (32 kg)  08/11/20 69 lb (31.3 kg)  07/13/20 78 lb 1.6 oz (35.4 kg)     Objective:    Vital Signs:  There were no vitals taken for this visit.   Physical Exam  I had requested pt come in to office after covid 19 rapid test was negative and she may have misunderstood,  but she left.   ASSESSMENT & PLAN:   1. Other fatigue Chronic  2. Generalized abdominal pain Refer to Dr Fritzi Mandes.   3.  Anosmia Brought in for covid 19 test today and was negative.  Pt was supposed to stay for an exam, but left.   4. Abnormal stools  Refer to Dr. Fritzi Mandes   I spent 15 minutes dedicated to the care of this patient on the date of this encounter to include time on the phone.   Follow Up:  In Person prn  Signed,  Blane Ohara, MD  09/28/2020 3:20 PM    [facility] [place]

## 2020-09-28 ENCOUNTER — Encounter: Payer: Self-pay | Admitting: Family Medicine

## 2020-09-28 ENCOUNTER — Ambulatory Visit (INDEPENDENT_AMBULATORY_CARE_PROVIDER_SITE_OTHER): Payer: Medicare Other

## 2020-09-28 DIAGNOSIS — Z20822 Contact with and (suspected) exposure to covid-19: Secondary | ICD-10-CM | POA: Diagnosis not present

## 2020-09-28 DIAGNOSIS — R5383 Other fatigue: Secondary | ICD-10-CM | POA: Diagnosis not present

## 2020-09-28 DIAGNOSIS — R5381 Other malaise: Secondary | ICD-10-CM | POA: Diagnosis not present

## 2020-09-28 LAB — POC COVID19 BINAXNOW: SARS Coronavirus 2 Ag: NEGATIVE

## 2020-09-28 NOTE — Progress Notes (Signed)
Patient Name: Navika Hoopes Date of Birth: 03-23-1932 MRN:  [id number]  Loletha Bertini Trishelle Devora is a 86 y.o. yo female presenting for COVID-19 testing.  She is being tested from the vehicle.  Sheretta Grumbine is being tested due to fatigue, congestion, and an elevated WBC.  During a MyChart visit yesterday Dr Sedalia Muta ordered a rapid and a PCR Test.  Rapid COVID test was NEGATIVE.   Jacklynn Bue, LPN 06:30 AM

## 2020-09-29 ENCOUNTER — Other Ambulatory Visit: Payer: Self-pay | Admitting: Family Medicine

## 2020-09-29 LAB — NOVEL CORONAVIRUS, NAA: SARS-CoV-2, NAA: NOT DETECTED

## 2020-09-29 LAB — SARS-COV-2, NAA 2 DAY TAT

## 2020-09-30 NOTE — Progress Notes (Signed)
Pt scheduled to see Hyacinth Meeker PA 10/05/20@1 :30pm for abd pain. Pt states she will call back if this appt does not work.

## 2020-10-03 ENCOUNTER — Ambulatory Visit (INDEPENDENT_AMBULATORY_CARE_PROVIDER_SITE_OTHER): Payer: Medicare Other | Admitting: Family Medicine

## 2020-10-03 ENCOUNTER — Other Ambulatory Visit: Payer: Self-pay

## 2020-10-03 VITALS — BP 132/64 | HR 76 | Temp 97.5°F | Resp 18 | Ht 59.0 in | Wt 70.2 lb

## 2020-10-03 DIAGNOSIS — R1084 Generalized abdominal pain: Secondary | ICD-10-CM | POA: Diagnosis not present

## 2020-10-03 DIAGNOSIS — K5903 Drug induced constipation: Secondary | ICD-10-CM

## 2020-10-03 DIAGNOSIS — R7989 Other specified abnormal findings of blood chemistry: Secondary | ICD-10-CM | POA: Diagnosis not present

## 2020-10-03 DIAGNOSIS — L650 Telogen effluvium: Secondary | ICD-10-CM | POA: Diagnosis not present

## 2020-10-03 DIAGNOSIS — D539 Nutritional anemia, unspecified: Secondary | ICD-10-CM | POA: Diagnosis not present

## 2020-10-03 NOTE — Patient Instructions (Addendum)
Stop megace.  Monitor symptoms.  Labs done.  Continue linzess 145 mcg daily until sees GI on Wednesday.  Keep appointment with Gastroenterology.  Keep appointment with me in January.

## 2020-10-03 NOTE — Progress Notes (Signed)
Acute Office Visit  Subjective:    Patient ID: Stephanie Clay, female    DOB: [birth year], 86 y.o.   MRN: [id number]  Chief Complaint  Patient presents with  . Abdominal Pain    HPI Patient is in today for abdominal pain. Eats small portions. Feels bloated when she eats, as well as nauseated. No vomiting. Has bm every 3 days and they are hard with small volume. Small balls of stool. Linzess 145 mcg daily. Drinks prune juice. Is no longer waiting.  The following medicines have failed: miralax, colace, pericolace. Pt is unsure if tried amitiza previously   Complaining of hair loss. Using Shampoo that is supposed to help with hair loss. Using it for years and it helped, but then it started falling out again.  Started on megace in 07/2020, but did not help gain weight.   Past Medical History:  Diagnosis Date  . Anemia   . Aneurysm of other specified arteries (HCC)   . Anxiety   . Arthritis   . Asthma   . Bronchiectasis without acute exacerbation (HCC) 01/03/2015   CT chest 12/2014:  Bronchiectasis throughout right lung, left clear.  +tree and bud, + patchy gg infiltrates in RLL Sputum 2016: no growth, AFB smear neg PFT's 02/2015:  Normal except mild decrease in DLCO   . Cataract    bilateral eye surgery  . Chronic chest pain   . Chronic idiopathic constipation   . Chronic kidney disease, stage 3 (HCC)   . Chronic venous hypertension (idiopathic) with other complications of bilateral lower extremity   . Depression   . Gastro-esophageal reflux   . Heart murmur   . Hyperlipemia   . Hypertension   . Idiopathic progressive neuropathy   . Iron (Fe) deficiency anemia   . Moderate protein-calorie malnutrition (HCC)   . Other idiopathic scoliosis, lumbar region   . Pneumathemia (HCC)   . Primary generalized (osteo)arthritis   . Primary insomnia   . Radiculopathy, lumbar region   . Scoliosis   . Secondary hyperparathyroidism (HCC)   . Thyroid disease   . Unilateral  primary osteoarthritis, left knee   . Unilateral primary osteoarthritis, right knee   . Varicose veins    Bilateral leg  . Vitamin D deficiency, unspecified     Past Surgical History:  Procedure Laterality Date  . ABDOMINAL HYSTERECTOMY    . CYSTOCELE REPAIR    . SALPINGOOPHORECTOMY      Family History  Problem Relation Age of Onset  . Emphysema Father   . Heart disease Mother   . Cancer Sister        metastatic lung cancer  . Heart disease Sister   . Hypertension Sister   . Varicose Veins Sister   . Hyperlipidemia Daughter   . Hypertension Daughter   . Diabetes Daughter   . Colon cancer Neg Hx   . Esophageal cancer Neg Hx   . Stomach cancer Neg Hx     Social History   Socioeconomic History  . Marital status: Married    Spouse name: carl  . Number of children: 3  . Years of education: 9  . Highest education level: Not on file  Occupational History  . Occupation: retired  Tobacco Use  . Smoking status: Never Smoker  . Smokeless tobacco: Never Used  Vaping Use  . Vaping Use: Never used  Substance and Sexual Activity  . Alcohol use: No    Alcohol/week: 0.0 standard drinks  . Drug use:  No  . Sexual activity: Not Currently  Other Topics Concern  . Not on file  Social History Narrative   Right handed   Social Determinants of Health   Financial Resource Strain: Not on file  Food Insecurity: Not on file  Transportation Needs: Not on file  Physical Activity: Not on file  Stress: Not on file  Social Connections: Not on file  Intimate Partner Violence: Not on file    Outpatient Medications Prior to Visit  Medication Sig Dispense Refill  . ALPRAZolam (XANAX) 0.5 MG tablet TAKE 1 TABLET BY MOUTH TWICE A DAY AS NEEDED 60 tablet 2  . Cholecalciferol (VITAMIN D3 PO) Take by mouth daily.    . Cyanocobalamin 2500 MCG TABS Take by mouth daily.    . diclofenac Sodium (VOLTAREN) 1 % GEL Apply topically 4 (four) times daily.    Marland Kitchen dicyclomine (BENTYL) 10 MG capsule  TAKE 1 CAPSULE BY MOUTH BEFORE EACH MEAL AND AT BEDTIME AS NEEDED FOR COLON SPASM 120 capsule 0  . fluticasone (FLONASE) 50 MCG/ACT nasal spray Place into both nostrils daily.    . Fluticasone-Umeclidin-Vilant (TRELEGY ELLIPTA) 100-62.5-25 MCG/INH AEPB Inhale 1 puff into the lungs daily. 1 each 2  . gabapentin (NEURONTIN) 300 MG capsule Take 1 capsule (300 mg total) by mouth 3 (three) times daily. 90 capsule 3  . hydrochlorothiazide (HYDRODIURIL) 25 MG tablet TAKE 1 TABLET BY MOUTH DAILY 90 tablet 0  . HYDROcodone-acetaminophen (NORCO/VICODIN) 5-325 MG tablet TAKE 1 TABLET BY MOUTH IN THE MORNING, AT NOON, AND AT BEDTIME. 90 tablet 0  . hydrOXYzine (ATARAX/VISTARIL) 25 MG tablet Take 25 mg by mouth 3 (three) times daily as needed.    Marland Kitchen levothyroxine (SYNTHROID) 50 MCG tablet TAKE 1 TABLET BY MOUTH DAILY 90 tablet 1  . lidocaine (LIDODERM) 5 % Place 3 patches onto the skin every 12 (twelve) hours.    Marland Kitchen linaclotide (LINZESS) 145 MCG CAPS capsule Take 1 capsule (145 mcg total) by mouth daily before breakfast. 90 capsule 1  . meclizine (ANTIVERT) 12.5 MG tablet TAKE 1 TABLET EVERY 8 HOURS AS NEEDED FOR DIZZINESS 40 tablet 0  . megestrol (MEGACE) 20 MG tablet TAKE 1 TABLET BY MOUTH IN THE MORNING 30 tablet 0  . nitroGLYCERIN (NITROSTAT) 0.4 MG SL tablet Place 0.4 mg under the tongue every 5 (five) minutes as needed for chest pain. X 3 doses    . ondansetron (ZOFRAN) 4 MG tablet TAKE 1 TABLET BY MOUTH THREE TIMES A DAY FOR NAUSEA. 90 tablet 0  . pantoprazole (PROTONIX) 40 MG tablet TAKE 1 TABLET TWICE DAILY 180 tablet 1  . potassium chloride SA (KLOR-CON) 20 MEQ tablet TAKE 3 TABLETS BY MOUTH DAILY 90 tablet 2  . simvastatin (ZOCOR) 40 MG tablet Take 1 tablet (40 mg total) by mouth daily. 30 tablet 2  . torsemide (DEMADEX) 20 MG tablet TAKE 1 TABLET BY MOUTH DAILY 5-6 HOURS AFTER FIRST DOSE OF HYDROCHLOROTHIAZIDE 90 tablet 2  . venlafaxine XR (EFFEXOR-XR) 150 MG 24 hr capsule Take 1 capsule (150 mg  total) by mouth daily with breakfast. 30 capsule 2  . Ferrous Sulfate (IRON PO) Take by mouth daily.    Marland Kitchen oxyCODONE-acetaminophen (PERCOCET/ROXICET) 5-325 MG tablet Take 1 tablet by mouth every 6 (six) hours as needed for severe pain. 120 tablet 0  . triamcinolone cream (KENALOG) 0.1 % Apply 1 application topically 2 (two) times daily as needed.      No facility-administered medications prior to visit.    Allergies  Allergen Reactions  . Lisinopril Other (See Comments)  . Remeron [Mirtazapine] Other (See Comments)  . Ciprofloxacin     unknown  . Levofloxacin     unknonw  . Nitrofurantoin     unknown  . Omnicef [Cefdinir] Diarrhea  . Sertraline Hcl Other (See Comments)    Tremors    Review of Systems  Constitutional: Positive for chills and fatigue. Negative for fever.  HENT: Positive for rhinorrhea. Negative for congestion and sore throat.   Respiratory: Negative for cough and shortness of breath.   Cardiovascular: Negative for chest pain.  Gastrointestinal: Positive for abdominal pain, blood in stool, constipation and nausea. Negative for diarrhea and vomiting.  Genitourinary: Negative for dysuria and urgency.  Musculoskeletal: Positive for back pain and myalgias.  Skin:       Hair loss  Neurological: Positive for weakness, light-headedness and headaches. Negative for dizziness.  Psychiatric/Behavioral: Positive for dysphoric mood. The patient is not nervous/anxious.        Objective:    Physical Exam Vitals reviewed.  Constitutional:      Appearance: Normal appearance.  Neck:     Vascular: No carotid bruit.  Cardiovascular:     Rate and Rhythm: Normal rate and regular rhythm.     Pulses: Normal pulses.     Heart sounds: Normal heart sounds.  Pulmonary:     Effort: Pulmonary effort is normal. No respiratory distress.     Breath sounds: Normal breath sounds.  Abdominal:     General: Abdomen is flat. Bowel sounds are normal.     Palpations: Abdomen is soft.      Tenderness: There is generalized abdominal tenderness.  Neurological:     Mental Status: She is alert and oriented to person, place, and time.  Psychiatric:        Mood and Affect: Mood normal.        Behavior: Behavior normal.     BP 132/64   Pulse 76   Temp (!) 97.5 F (36.4 C)   Resp 18   Ht 4\' 11"  (1.499 m)   Wt 70 lb 3.2 oz (31.8 kg)   BMI 14.18 kg/m  Wt Readings from Last 3 Encounters:  10/03/20 70 lb 3.2 oz (31.8 kg)  09/09/20 70 lb 9.6 oz (32 kg)  08/11/20 69 lb (31.3 kg)    Health Maintenance Due  Topic Date Due  . COVID-19 Vaccine (1) Never done    There are no preventive care reminders to display for this patient.   Lab Results  Component Value Date   TSH 3.780 08/11/2020   Lab Results  Component Value Date   WBC 13.8 (H) 09/14/2020   HGB 11.6 09/14/2020   HCT 34.1 09/14/2020   MCV 90 09/14/2020   PLT 341 09/14/2020   Lab Results  Component Value Date   NA 142 09/09/2020   K 4.6 09/09/2020   CO2 23 09/09/2020   GLUCOSE 82 09/09/2020   BUN 28 (H) 09/09/2020   CREATININE 1.02 (H) 09/09/2020   BILITOT 0.3 09/09/2020   ALKPHOS 77 09/09/2020   AST 19 09/09/2020   ALT 12 09/09/2020   PROT 7.7 09/09/2020   ALBUMIN 4.2 09/09/2020   CALCIUM 10.0 09/09/2020   ANIONGAP 10 07/07/2016   GFR 48.24 (L) 04/07/2018   Lab Results  Component Value Date   CHOL 193 09/09/2020   Lab Results  Component Value Date   HDL 58 09/09/2020   Lab Results  Component Value Date   LDLCALC 122 (  H) 09/09/2020   Lab Results  Component Value Date   TRIG 71 09/09/2020   Lab Results  Component Value Date   CHOLHDL 3.3 09/09/2020   Lab Results  Component Value Date   HGBA1C 5.5 03/08/2020       Assessment & Plan:  1. Generalized abdominal pain May be secondary to megace.  Keep appt to see GI. - Comprehensive metabolic panel  2. Drug induced constipation Will wait until pt sees GI to see if megace discontinuation helps.   3. Anemia associated with  nutritional deficiency - Methylmalonic acid, serum - B12 and Folate Panel - CBC with Differential/Platelet  4. Acute telogen effluvium - Ferritin - TSH    Orders Placed This Encounter  Procedures  . Methylmalonic acid, serum  . B12 and Folate Panel  . CBC with Differential/Platelet  . Comprehensive metabolic panel  . Ferritin  . TSH     I spent 20 minutes dedicated to the care of this patient on the date of this encounter to include face-to-face time with the patient, as well as: review of labs/chart.  Follow-up: Return in about 4 weeks (around 10/31/2020).  An After Visit Summary was printed and given to the patient.  Blane OharaKirsten Deane Wattenbarger, MD [facility] [phone]

## 2020-10-04 ENCOUNTER — Encounter: Payer: Self-pay | Admitting: Family Medicine

## 2020-10-04 DIAGNOSIS — S22059D Unspecified fracture of T5-T6 vertebra, subsequent encounter for fracture with routine healing: Secondary | ICD-10-CM | POA: Diagnosis not present

## 2020-10-04 DIAGNOSIS — S22050A Wedge compression fracture of T5-T6 vertebra, initial encounter for closed fracture: Secondary | ICD-10-CM | POA: Diagnosis not present

## 2020-10-04 DIAGNOSIS — M546 Pain in thoracic spine: Secondary | ICD-10-CM | POA: Diagnosis not present

## 2020-10-04 LAB — TSH: TSH: 4.37 u[IU]/mL (ref 0.450–4.500)

## 2020-10-04 LAB — FERRITIN: Ferritin: 544 ng/mL — ABNORMAL HIGH (ref 15–150)

## 2020-10-05 ENCOUNTER — Ambulatory Visit (INDEPENDENT_AMBULATORY_CARE_PROVIDER_SITE_OTHER): Payer: Medicare Other | Admitting: Physician Assistant

## 2020-10-05 ENCOUNTER — Encounter: Payer: Self-pay | Admitting: Physician Assistant

## 2020-10-05 VITALS — BP 120/70 | HR 79 | Ht 59.0 in | Wt <= 1120 oz

## 2020-10-05 DIAGNOSIS — R1084 Generalized abdominal pain: Secondary | ICD-10-CM | POA: Diagnosis not present

## 2020-10-05 DIAGNOSIS — R140 Abdominal distension (gaseous): Secondary | ICD-10-CM | POA: Diagnosis not present

## 2020-10-05 DIAGNOSIS — R110 Nausea: Secondary | ICD-10-CM | POA: Diagnosis not present

## 2020-10-05 DIAGNOSIS — K5900 Constipation, unspecified: Secondary | ICD-10-CM | POA: Diagnosis not present

## 2020-10-05 MED ORDER — LINACLOTIDE 290 MCG PO CAPS: 290.0000 ug | ORAL_CAPSULE | Freq: Every day | ORAL | 2 refills | Status: DC

## 2020-10-05 NOTE — Progress Notes (Signed)
Agree with assessment and plan as outlined.  

## 2020-10-05 NOTE — Progress Notes (Signed)
Chief Complaint: Abdominal pain  HPI:    Stephanie Clay is an 86 year old female, known to Dr. Adela Lank, with a past medical history as listed below, who was referred to me by Blane Ohara, MD for a complaint of abdominal pain.      02/2009 colonoscopy with Dr. Chales Abrahams showing 8 mm descending colon polyps and sigmoid diverticulosis.    06/2012 EGD with Dr. Jethro Poling normal esophagus, small hiatal hernia, mild gastritis normal duodenum.  Biopsies showed a normal small bowel.    05/19/2015 EGD for dysphagia-Schatzki's ring dilated to 48 with a [place] Maloney and mild gastritis.    03/13/2018 office visit with Dr. Adela Lank for abdominal pain, GERD, dysphagia, weight loss, nausea and anorexia.  That time described abdominal pain "all over" her abdomen but seem to localize to the upper/mid abdomen.  Apparently had had a CT scan of the abdomen pelvis with contrast in March 2018 which showed a stable small splenic arterial aneurysm and some aortoiliac atherosclerosis.  Also noncontrast CT in July 2018 with no acute changes.  That time was thought her bronchiectasis could be related to some of her weight loss.  She was offered another EGD for dysphagia.  Her Protonix was increased to 40 twice a day and added Zofran.  He was discussed that if EGD failed to show any clear cause then could proceed with CT angiogram to assess for pathology to cause mesenteric ischemic changes.    03/25/2018 EGD with normal esophagus, empiric dilation done to 17 mm, normal stomach, normal duodenal bulb.  Biopsies negative for H. pylori.  Is recommend she had a CT angiogram.    04/09/2018 CT angio abdomen pelvis with patent celiac, SMA and IMA, no evidence of significant narrowing of the visceral vasculature, 0.8 cm polyp splenic artery aneurysm, heterogenous enhancement of the liver, reticulonodular opacities and mucoid impaction in the left lower lobe, prominent stool burden.  That time patient was contacted and doing better with Zofran  and Protonix along with Bentyl.    10/03/2020 office visit with PCP at that time discussed abdominal pain.  Apparently ate small portions and felt bloated when she as well as nauseated with no vomiting.  Had a bowel movement every 3 days which were hard and small volume, took Linzess 145 mcg daily and prune juice.  Apparently started on Megace 10/21 but it did not help gain weight.    Today, patient presents to clinic accompanied by her daughter who does assist with history.  The patient tells me that she does not recall that she ever really felt better with this abdominal pain which apparently started back in June/July, she describes this as a cramping sensation and "sometimes I can feel things moving around in there", also describes "a knot" which is on the left or right side, she cannot remember, it also is very painful.  Typically this pain is worse after eating and will leave her having a bowel movement but she tells me that now she is constipated and it is very hard for her to have a bowel movement even with the Linzess 145 mcg daily.  Tells me she will have a bowel movement maybe once every 3 days and is very small and typically covered in mucus.  Also sometimes has to drink prune juice in order to achieve a bowel movement.  Does use dicyclomine 10 mg as needed for severe abdominal cramping which helps.  Typically has to use this once every couple of days.  Associated symptoms include nausea  and bloating.    Denies fever, chills or blood in her stool.  Past Medical History:  Diagnosis Date  . Anemia   . Aneurysm of other specified arteries (HCC)   . Anxiety   . Arthritis   . Asthma   . Bronchiectasis without acute exacerbation (HCC) 01/03/2015   CT chest 12/2014:  Bronchiectasis throughout right lung, left clear.  +tree and bud, + patchy gg infiltrates in RLL Sputum 2016: no growth, AFB smear neg PFT's 02/2015:  Normal except mild decrease in DLCO   . Cataract    bilateral eye surgery  . Chronic  chest pain   . Chronic idiopathic constipation   . Chronic kidney disease, stage 3 (HCC)   . Chronic venous hypertension (idiopathic) with other complications of bilateral lower extremity   . Depression   . Gastro-esophageal reflux   . Heart murmur   . Hyperlipemia   . Hypertension   . Idiopathic progressive neuropathy   . Iron (Fe) deficiency anemia   . Moderate protein-calorie malnutrition (HCC)   . Other idiopathic scoliosis, lumbar region   . Pneumathemia (HCC)   . Primary generalized (osteo)arthritis   . Primary insomnia   . Radiculopathy, lumbar region   . Scoliosis   . Secondary hyperparathyroidism (HCC)   . Thyroid disease   . Unilateral primary osteoarthritis, left knee   . Unilateral primary osteoarthritis, right knee   . Varicose veins    Bilateral leg  . Vitamin D deficiency, unspecified     Past Surgical History:  Procedure Laterality Date  . ABDOMINAL HYSTERECTOMY    . CYSTOCELE REPAIR    . SALPINGOOPHORECTOMY      Current Outpatient Medications  Medication Sig Dispense Refill  . ALPRAZolam (XANAX) 0.5 MG tablet TAKE 1 TABLET BY MOUTH TWICE A DAY AS NEEDED 60 tablet 2  . Cholecalciferol (VITAMIN D3 PO) Take by mouth daily.    . Cyanocobalamin 2500 MCG TABS Take by mouth daily.    . diclofenac Sodium (VOLTAREN) 1 % GEL Apply topically 4 (four) times daily.    Marland Kitchen dicyclomine (BENTYL) 10 MG capsule TAKE 1 CAPSULE BY MOUTH BEFORE EACH MEAL AND AT BEDTIME AS NEEDED FOR COLON SPASM 120 capsule 0  . fluticasone (FLONASE) 50 MCG/ACT nasal spray Place into both nostrils daily.    . Fluticasone-Umeclidin-Vilant (TRELEGY ELLIPTA) 100-62.5-25 MCG/INH AEPB Inhale 1 puff into the lungs daily. 1 each 2  . gabapentin (NEURONTIN) 300 MG capsule Take 1 capsule (300 mg total) by mouth 3 (three) times daily. 90 capsule 3  . hydrochlorothiazide (HYDRODIURIL) 25 MG tablet TAKE 1 TABLET BY MOUTH DAILY 90 tablet 0  . HYDROcodone-acetaminophen (NORCO/VICODIN) 5-325 MG tablet TAKE 1  TABLET BY MOUTH IN THE MORNING, AT NOON, AND AT BEDTIME. 90 tablet 0  . hydrOXYzine (ATARAX/VISTARIL) 25 MG tablet Take 25 mg by mouth 3 (three) times daily as needed.    Marland Kitchen levothyroxine (SYNTHROID) 50 MCG tablet TAKE 1 TABLET BY MOUTH DAILY 90 tablet 1  . lidocaine (LIDODERM) 5 % Place 3 patches onto the skin every 12 (twelve) hours.    Marland Kitchen linaclotide (LINZESS) 145 MCG CAPS capsule Take 1 capsule (145 mcg total) by mouth daily before breakfast. 90 capsule 1  . meclizine (ANTIVERT) 12.5 MG tablet TAKE 1 TABLET EVERY 8 HOURS AS NEEDED FOR DIZZINESS 40 tablet 0  . megestrol (MEGACE) 20 MG tablet TAKE 1 TABLET BY MOUTH IN THE MORNING 30 tablet 0  . nitroGLYCERIN (NITROSTAT) 0.4 MG SL tablet Place 0.4 mg  under the tongue every 5 (five) minutes as needed for chest pain. X 3 doses    . ondansetron (ZOFRAN) 4 MG tablet TAKE 1 TABLET BY MOUTH THREE TIMES A DAY FOR NAUSEA. 90 tablet 0  . pantoprazole (PROTONIX) 40 MG tablet TAKE 1 TABLET TWICE DAILY 180 tablet 1  . potassium chloride SA (KLOR-CON) 20 MEQ tablet TAKE 3 TABLETS BY MOUTH DAILY 90 tablet 2  . simvastatin (ZOCOR) 40 MG tablet Take 1 tablet (40 mg total) by mouth daily. 30 tablet 2  . torsemide (DEMADEX) 20 MG tablet TAKE 1 TABLET BY MOUTH DAILY 5-6 HOURS AFTER FIRST DOSE OF HYDROCHLOROTHIAZIDE 90 tablet 2  . venlafaxine XR (EFFEXOR-XR) 150 MG 24 hr capsule Take 1 capsule (150 mg total) by mouth daily with breakfast. 30 capsule 2   No current facility-administered medications for this visit.    Allergies as of 10/05/2020 - Review Complete 10/04/2020  Allergen Reaction Noted  . Lisinopril Other (See Comments) 12/30/2014  . Remeron [mirtazapine] Other (See Comments) 12/30/2014  . Ciprofloxacin  04/07/2016  . Levofloxacin  04/07/2016  . Nitrofurantoin  04/07/2016  . Omnicef [cefdinir] Diarrhea 11/26/2019  . Sertraline hcl Other (See Comments) 11/26/2019    Family History  Problem Relation Age of Onset  . Emphysema Father   . Heart  disease Mother   . Cancer Sister        metastatic lung cancer  . Heart disease Sister   . Hypertension Sister   . Varicose Veins Sister   . Hyperlipidemia Daughter   . Hypertension Daughter   . Diabetes Daughter   . Colon cancer Neg Hx   . Esophageal cancer Neg Hx   . Stomach cancer Neg Hx     Social History   Socioeconomic History  . Marital status: Married    Spouse name: carl  . Number of children: 3  . Years of education: 9  . Highest education level: Not on file  Occupational History  . Occupation: retired  Tobacco Use  . Smoking status: Never Smoker  . Smokeless tobacco: Never Used  Vaping Use  . Vaping Use: Never used  Substance and Sexual Activity  . Alcohol use: No    Alcohol/week: 0.0 standard drinks  . Drug use: No  . Sexual activity: Not Currently  Other Topics Concern  . Not on file  Social History Narrative   Right handed   Social Determinants of Health   Financial Resource Strain: Not on file  Food Insecurity: Not on file  Transportation Needs: Not on file  Physical Activity: Not on file  Stress: Not on file  Social Connections: Not on file  Intimate Partner Violence: Not on file    Review of Systems:    Constitutional: No weight loss, fever or chills Cardiovascular: No chest pain Respiratory: No SOB Gastrointestinal: See HPI and otherwise negative   Physical Exam:  Vital signs: BP 120/70   Pulse 79   Ht 4\' 11"  (1.499 m)   Wt 70 lb (31.8 kg)   BMI 14.14 kg/m   Constitutional:   Pleasant thin, frail-appearing, elderly Caucasian female appears to be in NAD, Well developed, Well nourished, alert and cooperative Respiratory: Respirations even and unlabored. Lungs clear to auscultation bilaterally.   No wheezes, crackles, or rhonchi.  Cardiovascular: Normal S1, S2. No MRG. Regular rate and rhythm. No peripheral edema, cyanosis or pallor.  Gastrointestinal:  Soft, nondistended, nontender. No rebound or guarding. Normal bowel sounds. No  appreciable masses or hepatomegaly. Rectal:  Not performed.  Msk:  Symmetrical without gross deformities. Without edema, no deformity or joint abnormality.  In a wheelchair Psychiatric: Oriented to person, place and time. Demonstrates good judgement and reason without abnormal affect or behaviors.  RELEVANT LABS AND IMAGING: CBC    Component Value Date/Time   WBC 13.8 (H) 09/14/2020 1049   WBC 13.2 (H) 07/07/2016 1249   RBC 3.79 09/14/2020 1049   RBC 3.75 (L) 07/07/2016 1249   HGB 11.6 09/14/2020 1049   HCT 34.1 09/14/2020 1049   PLT 341 09/14/2020 1049   MCV 90 09/14/2020 1049   MCH 30.6 09/14/2020 1049   MCH 29.1 07/07/2016 1249   MCHC 34.0 09/14/2020 1049   MCHC 31.1 07/07/2016 1249   RDW 12.0 09/14/2020 1049   LYMPHSABS 2.1 09/09/2020 1156   MONOABS 1.3 (H) 07/07/2016 1249   EOSABS 0.5 (H) 09/09/2020 1156   BASOSABS 0.1 09/09/2020 1156    CMP     Component Value Date/Time   NA 142 09/09/2020 1156   K 4.6 09/09/2020 1156   CL 102 09/09/2020 1156   CO2 23 09/09/2020 1156   GLUCOSE 82 09/09/2020 1156   GLUCOSE 90 04/07/2018 1005   BUN 28 (H) 09/09/2020 1156   CREATININE 1.02 (H) 09/09/2020 1156   CALCIUM 10.0 09/09/2020 1156   PROT 7.7 09/09/2020 1156   ALBUMIN 4.2 09/09/2020 1156   AST 19 09/09/2020 1156   ALT 12 09/09/2020 1156   ALKPHOS 77 09/09/2020 1156   BILITOT 0.3 09/09/2020 1156   GFRNONAA 50 (L) 09/09/2020 1156   GFRAA 58 (L) 09/09/2020 1156    Assessment: 1.  Generalized abdominal pain: Likely multifactorial, has had full evaluation in the past including a CT angiogram, see HPI 2.  Constipation: Chronic for the patient, Linzess 145 no longer working, likely contributing to above 3.  Nausea and bloating: Likely related to above  Plan: 1.  At this time we will try to make patient's constipation better to ensure this is not contributing to all of her other symptoms.  Increased Linzess to 290 mcg daily, 30 minutes before breakfast.  Patient was advised  to take 2 of her 145 mcg capsules for now and we will send in a prescription as well #30 with 5 refills. 2.  Discussed with patient that hopefully if we can get her to having more regular bowel movements and this will alleviate some of her abdominal pain and possibly even nausea. 3.  Told the patient and her daughter to call me in a month and let me know how the patient is doing.  If she is no better then we can discuss switching to Amitiza, Movantik or a different medicine versus adding MiraLAX. 4.  Patient to follow in clinic with us as needed.  Hyacinth MeekerJennifer Josiyah Tozzi, PA-C [facility] 10/05/2020, 1:24 PM  Cc: Blane Oharaox, Kirsten, MD

## 2020-10-05 NOTE — Patient Instructions (Signed)
If you are age 84 or older, your body mass index should be between 23-30. Your Body mass index is 14.14 kg/m. If this is out of the aforementioned range listed, please consider follow up with your Primary Care Provider.  If you are age 83 or younger, your body mass index should be between 19-25. Your Body mass index is 14.14 kg/m. If this is out of the aformentioned range listed, please consider follow up with your Primary Care Provider.   We have sent the following medications to your pharmacy for you to pick up at your convenience: Linzess 290 mcg   Thank you for choosing me and [facility].  Hyacinth Meeker, PA-C

## 2020-10-06 LAB — COMPREHENSIVE METABOLIC PANEL
ALT: 15 IU/L (ref 0–32)
AST: 21 IU/L (ref 0–40)
Albumin/Globulin Ratio: 0.9 — ABNORMAL LOW (ref 1.2–2.2)
Albumin: 3.6 g/dL (ref 3.6–4.6)
Alkaline Phosphatase: 75 IU/L (ref 44–121)
BUN/Creatinine Ratio: 30 — ABNORMAL HIGH (ref 12–28)
BUN: 33 mg/dL — ABNORMAL HIGH (ref 8–27)
Bilirubin Total: 0.2 mg/dL (ref 0.0–1.2)
CO2: 26 mmol/L (ref 20–29)
Calcium: 9.7 mg/dL (ref 8.7–10.3)
Chloride: 99 mmol/L (ref 96–106)
Creatinine, Ser: 1.09 mg/dL — ABNORMAL HIGH (ref 0.57–1.00)
GFR calc Af Amer: 53 mL/min/{1.73_m2} — ABNORMAL LOW (ref >59)
GFR calc non Af Amer: 46 mL/min/{1.73_m2} — ABNORMAL LOW (ref >59)
Globulin, Total: 4.1 g/dL (ref 1.5–4.5)
Glucose: 137 mg/dL — ABNORMAL HIGH (ref 65–99)
Potassium: 3.7 mmol/L (ref 3.5–5.2)
Sodium: 141 mmol/L (ref 134–144)
Total Protein: 7.7 g/dL (ref 6.0–8.5)

## 2020-10-06 LAB — CBC WITH DIFFERENTIAL/PLATELET
Basophils Absolute: 0.1 10*3/uL (ref 0.0–0.2)
Basos: 1 %
EOS (ABSOLUTE): 0.4 10*3/uL (ref 0.0–0.4)
Eos: 3 %
Hematocrit: 30.3 % — ABNORMAL LOW (ref 34.0–46.6)
Hemoglobin: 10.3 g/dL — ABNORMAL LOW (ref 11.1–15.9)
Immature Grans (Abs): 0.2 10*3/uL — ABNORMAL HIGH (ref 0.0–0.1)
Immature Granulocytes: 1 %
Lymphocytes Absolute: 1.8 10*3/uL (ref 0.7–3.1)
Lymphs: 13 %
MCH: 30.7 pg (ref 26.6–33.0)
MCHC: 34 g/dL (ref 31.5–35.7)
MCV: 90 fL (ref 79–97)
Monocytes Absolute: 1 10*3/uL — ABNORMAL HIGH (ref 0.1–0.9)
Monocytes: 7 %
Neutrophils Absolute: 10.5 10*3/uL — ABNORMAL HIGH (ref 1.4–7.0)
Neutrophils: 75 %
Platelets: 421 10*3/uL (ref 150–450)
RBC: 3.36 x10E6/uL — ABNORMAL LOW (ref 3.77–5.28)
RDW: 12.4 % (ref 11.7–15.4)
WBC: 13.9 10*3/uL — ABNORMAL HIGH (ref 3.4–10.8)

## 2020-10-06 LAB — B12 AND FOLATE PANEL
Folate: >20 ng/mL (ref >3.0)
Vitamin B-12: >2000 pg/mL — ABNORMAL HIGH (ref 232–1245)

## 2020-10-06 LAB — METHYLMALONIC ACID, SERUM: Methylmalonic Acid: 553 nmol/L — ABNORMAL HIGH (ref 0–378)

## 2020-10-12 LAB — SPECIMEN STATUS REPORT

## 2020-10-12 LAB — HEMOCHROMATOSIS DNA-PCR(C282Y,H63D)

## 2020-10-17 ENCOUNTER — Other Ambulatory Visit: Payer: Self-pay | Admitting: Physician Assistant

## 2020-10-17 ENCOUNTER — Other Ambulatory Visit: Payer: Self-pay | Admitting: Family Medicine

## 2020-10-17 DIAGNOSIS — J449 Chronic obstructive pulmonary disease, unspecified: Secondary | ICD-10-CM

## 2020-10-20 ENCOUNTER — Other Ambulatory Visit: Payer: Self-pay | Admitting: Family Medicine

## 2020-10-20 DIAGNOSIS — J449 Chronic obstructive pulmonary disease, unspecified: Secondary | ICD-10-CM

## 2020-10-24 ENCOUNTER — Other Ambulatory Visit: Payer: Self-pay | Admitting: Family Medicine

## 2020-11-16 ENCOUNTER — Ambulatory Visit (INDEPENDENT_AMBULATORY_CARE_PROVIDER_SITE_OTHER): Payer: Medicare Other | Admitting: Family Medicine

## 2020-11-16 ENCOUNTER — Other Ambulatory Visit: Payer: Self-pay

## 2020-11-16 VITALS — BP 136/60 | HR 72 | Temp 97.4°F | Resp 18 | Ht 59.0 in | Wt 72.0 lb

## 2020-11-16 DIAGNOSIS — R251 Tremor, unspecified: Secondary | ICD-10-CM

## 2020-11-16 DIAGNOSIS — L650 Telogen effluvium: Secondary | ICD-10-CM

## 2020-11-16 DIAGNOSIS — K1379 Other lesions of oral mucosa: Secondary | ICD-10-CM

## 2020-11-16 DIAGNOSIS — K5904 Chronic idiopathic constipation: Secondary | ICD-10-CM

## 2020-11-16 MED ORDER — PRIMIDONE 50 MG PO TABS: 50.0000 mg | ORAL_TABLET | Freq: Four times a day (QID) | ORAL | 0 refills | Status: DC | PRN

## 2020-11-16 MED ORDER — LUBIPROSTONE 24 MCG PO CAPS: 24.0000 ug | ORAL_CAPSULE | Freq: Two times a day (BID) | ORAL | 2 refills | Status: DC

## 2020-11-16 MED ORDER — KETOCONAZOLE 2 % EX CREA: 1.0000 "application " | TOPICAL_CREAM | Freq: Two times a day (BID) | CUTANEOUS | 0 refills | Status: DC

## 2020-11-16 MED ORDER — TRIAMCINOLONE ACETONIDE 0.1 % MT PSTE: 1.0000 "application " | PASTE | Freq: Two times a day (BID) | OROMUCOSAL | 0 refills | Status: DC

## 2020-11-16 NOTE — Progress Notes (Signed)
Subjective:  Patient ID: Stephanie Clay, female    DOB: 1931/10/24  Age: 87 y.o. MRN: [id number]  Chief Complaint  Patient presents with  . Hyperlipidemia  . Hypothyroidism    HPI Has mouth ulcers - given magic mouthwash by the dentist last week. This helps, but more keep coming. Has not had this previously. No change in diet.   Saw GI. Increased linzess to 145 mg 2 daily in am. BMs now daily instead of every 3 days. Not hard, but small volume. Continues to have bloating and crampy abdominal pain.   Hair loss: May have improved a little.   Current Outpatient Medications on File Prior to Visit  Medication Sig Dispense Refill  . ALPRAZolam (XANAX) 0.5 MG tablet TAKE 1 TABLET BY MOUTH TWICE A DAY AS NEEDED 60 tablet 1  . Cholecalciferol (VITAMIN D3 PO) Take by mouth daily.    . Cyanocobalamin 2500 MCG TABS Take by mouth daily.    . diclofenac Sodium (VOLTAREN) 1 % GEL Apply topically 4 (four) times daily.    Marland Kitchen dicyclomine (BENTYL) 10 MG capsule TAKE 1 CAPSULE BY MOUTH BEFORE EACH MEAL AND AT BEDTIME AS NEEDED FOR COLON SPASM 120 capsule 0  . fluticasone (FLONASE) 50 MCG/ACT nasal spray Place into both nostrils daily.    . Fluticasone-Umeclidin-Vilant (TRELEGY ELLIPTA) 100-62.5-25 MCG/INH AEPB Inhale 1 puff into the lungs daily. 1 each 2  . gabapentin (NEURONTIN) 300 MG capsule Take 1 capsule (300 mg total) by mouth 3 (three) times daily. 90 capsule 3  . hydrochlorothiazide (HYDRODIURIL) 25 MG tablet TAKE 1 TABLET BY MOUTH DAILY 90 tablet 0  . HYDROcodone-acetaminophen (NORCO/VICODIN) 5-325 MG tablet TAKE 1 TABLET BY MOUTH IN THE MORNING, AT NOON, AND AT BEDTIME. 90 tablet 0  . hydrOXYzine (ATARAX/VISTARIL) 25 MG tablet Take 25 mg by mouth 3 (three) times daily as needed.    Marland Kitchen levothyroxine (SYNTHROID) 50 MCG tablet TAKE 1 TABLET BY MOUTH DAILY 90 tablet 1  . lidocaine (LIDODERM) 5 % Place 3 patches onto the skin every 12 (twelve) hours.    . meclizine (ANTIVERT) 12.5 MG  tablet TAKE 1 TABLET EVERY 8 HOURS AS NEEDED FOR DIZZINESS 40 tablet 0  . nitroGLYCERIN (NITROSTAT) 0.4 MG SL tablet Place 0.4 mg under the tongue every 5 (five) minutes as needed for chest pain. X 3 doses    . ondansetron (ZOFRAN) 4 MG tablet TAKE 1 TABLET BY MOUTH THREE TIMES A DAY FOR NAUSEA. 90 tablet 0  . pantoprazole (PROTONIX) 40 MG tablet TAKE 1 TABLET TWICE DAILY 180 tablet 1  . potassium chloride SA (KLOR-CON) 20 MEQ tablet TAKE 3 TABLETS BY MOUTH DAILY 90 tablet 2  . simvastatin (ZOCOR) 40 MG tablet Take 1 tablet (40 mg total) by mouth daily. 30 tablet 2  . torsemide (DEMADEX) 20 MG tablet TAKE 1 TABLET BY MOUTH DAILY 5-6 HOURS AFTER FIRST DOSE OF HYDROCHLOROTHIAZIDE 90 tablet 2  . venlafaxine XR (EFFEXOR-XR) 75 MG 24 hr capsule TAKE 1 CAPSULE BY MOUTH DAILY WITH BREAKFAST. 30 capsule 1   No current facility-administered medications on file prior to visit.   Past Medical History:  Diagnosis Date  . Anemia   . Aneurysm of other specified arteries (HCC)   . Anxiety   . Arthritis   . Asthma   . Bronchiectasis without acute exacerbation (HCC) 01/03/2015   CT chest 12/2014:  Bronchiectasis throughout right lung, left clear.  +tree and bud, + patchy gg infiltrates in RLL Sputum 2016: no  growth, AFB smear neg PFT's 02/2015:  Normal except mild decrease in DLCO   . Cataract    bilateral eye surgery  . Chronic chest pain   . Chronic idiopathic constipation   . Chronic kidney disease, stage 3 (HCC)   . Chronic venous hypertension (idiopathic) with other complications of bilateral lower extremity   . Depression   . Gastro-esophageal reflux   . Heart murmur   . Hyperlipemia   . Hypertension   . Idiopathic progressive neuropathy   . Iron (Fe) deficiency anemia   . Moderate protein-calorie malnutrition (HCC)   . Other idiopathic scoliosis, lumbar region   . Pneumathemia (HCC)   . Primary generalized (osteo)arthritis   . Primary insomnia   . Radiculopathy, lumbar region   . Scoliosis    . Secondary hyperparathyroidism (HCC)   . Thyroid disease   . Unilateral primary osteoarthritis, left knee   . Unilateral primary osteoarthritis, right knee   . Varicose veins    Bilateral leg  . Vitamin D deficiency, unspecified    Past Surgical History:  Procedure Laterality Date  . ABDOMINAL HYSTERECTOMY    . CYSTOCELE REPAIR    . SALPINGOOPHORECTOMY      Family History  Problem Relation Age of Onset  . Emphysema Father   . Heart disease Mother   . Cancer Sister        metastatic lung cancer  . Heart disease Sister   . Hypertension Sister   . Varicose Veins Sister   . Hyperlipidemia Daughter   . Hypertension Daughter   . Diabetes Daughter   . Colon cancer Neg Hx   . Esophageal cancer Neg Hx   . Stomach cancer Neg Hx    Social History   Socioeconomic History  . Marital status: Widowed    Spouse name: carl  . Number of children: 3  . Years of education: 9  . Highest education level: Not on file  Occupational History  . Occupation: retired  Tobacco Use  . Smoking status: Never Smoker  . Smokeless tobacco: Never Used  Vaping Use  . Vaping Use: Never used  Substance and Sexual Activity  . Alcohol use: No    Alcohol/week: 0.0 standard drinks  . Drug use: No  . Sexual activity: Not Currently  Other Topics Concern  . Not on file  Social History Narrative   Right handed   Social Determinants of Health   Financial Resource Strain: Not on file  Food Insecurity: Not on file  Transportation Needs: Not on file  Physical Activity: Not on file  Stress: Not on file  Social Connections: Not on file    Review of Systems  Constitutional: Positive for fatigue. Negative for chills and fever.  HENT: Positive for rhinorrhea. Negative for congestion and sore throat.   Respiratory: Positive for cough. Negative for shortness of breath.   Cardiovascular: Negative for chest pain.  Gastrointestinal: Positive for abdominal pain and constipation. Negative for diarrhea,  nausea and vomiting.  Genitourinary: Negative for dysuria and urgency.  Musculoskeletal: Negative for back pain and myalgias.  Neurological: Positive for dizziness, weakness and headaches. Negative for light-headedness.  Psychiatric/Behavioral: Negative for dysphoric mood. The patient is not nervous/anxious.      Objective:  BP 136/60   Pulse 72   Temp (!) 97.4 F (36.3 C)   Resp 18   Ht 4\' 11"  (1.499 m)   Wt 72 lb (32.7 kg)   BMI 14.54 kg/m   BP/Weight 11/16/2020 10/05/2020 10/03/2020  Systolic  BP 136 120 132  Diastolic BP 60 70 64  Wt. (Lbs) 72 70 70.2  BMI 14.54 14.14 14.18    Physical Exam Vitals reviewed.  Constitutional:      Appearance: Normal appearance.  HENT:     Mouth/Throat:     Mouth: Oral lesions present.     Tongue: Lesions present.  Cardiovascular:     Rate and Rhythm: Normal rate and regular rhythm.     Heart sounds: Normal heart sounds.  Pulmonary:     Effort: Pulmonary effort is normal.     Breath sounds: Normal breath sounds.  Abdominal:     Palpations: Abdomen is soft.     Tenderness: There is no abdominal tenderness.  Neurological:     Mental Status: She is alert and oriented to person, place, and time.  Psychiatric:        Mood and Affect: Mood normal.        Behavior: Behavior normal.     Diabetic Foot Exam - Simple   No data filed      Lab Results  Component Value Date   WBC 13.9 (H) 10/03/2020   HGB 10.3 (L) 10/03/2020   HCT 30.3 (L) 10/03/2020   PLT 421 10/03/2020   GLUCOSE 137 (H) 10/03/2020   CHOL 193 09/09/2020   TRIG 71 09/09/2020   HDL 58 09/09/2020   LDLCALC 122 (H) 09/09/2020   ALT 15 10/03/2020   AST 21 10/03/2020   NA 141 10/03/2020   K 3.7 10/03/2020   CL 99 10/03/2020   CREATININE 1.09 (H) 10/03/2020   BUN 33 (H) 10/03/2020   CO2 26 10/03/2020   TSH 4.370 10/03/2020   HGBA1C 5.5 03/08/2020      Assessment & Plan:   1. Tremor Start on primidone 50 mg one twice daily and may increase to four times a  day if persistent tremor and medicine is tolerated at lower doses. - primidone (MYSOLINE) 50 MG tablet; Take 1 tablet (50 mg total) by mouth 4 (four) times daily as needed (tremor).  Dispense: 120 tablet; Refill: 0  2. Chronic idiopathic constipation Change linzess to amitiza 24 mc one twice daily.  3. Acute telogen effluvium  Recommend rogaine.otc.  4. Mouth sores Triamincolone paste - inside mouth.  Outside mouth use nizoral cream.   Meds ordered this encounter  Medications  . lubiprostone (AMITIZA) 24 MCG capsule    Sig: Take 1 capsule (24 mcg total) by mouth 2 (two) times daily with a meal.    Dispense:  60 capsule    Refill:  2  . primidone (MYSOLINE) 50 MG tablet    Sig: Take 1 tablet (50 mg total) by mouth 4 (four) times daily as needed (tremor).    Dispense:  120 tablet    Refill:  0  . ketoconazole (NIZORAL) 2 % cream    Sig: Apply 1 application topically 2 (two) times daily.    Dispense:  15 g    Refill:  0  . triamcinolone (KENALOG) 0.1 % paste    Sig: Use as directed 1 application in the mouth or throat 2 (two) times daily.    Dispense:  5 g    Refill:  0    Follow-up: Return in about 3 months (around 02/14/2021).  An After Visit Summary was printed and given to the patient.  Blane Ohara, MD [facility] [phone]

## 2020-11-16 NOTE — Patient Instructions (Addendum)
Change linzess to amitiza 24 mcg one twice daily.   Start on primidone 50 mg one twice daily and may increase to four times a day if persistent tremor and medicine is tolerated at lower doses.  Inside mouth: triamincolone paste.  Outside mouth use nizoral cream.   Hair loss: may use rogaine otc.

## 2020-11-20 ENCOUNTER — Encounter: Payer: Self-pay | Admitting: Family Medicine

## 2020-11-21 ENCOUNTER — Telehealth: Payer: Self-pay

## 2020-11-21 NOTE — Telephone Encounter (Signed)
Stephanie Clay called to report that she took Primidone 50  Mg once and she experienced dizziness, sluggishness and she stopped the medication.

## 2020-11-21 NOTE — Telephone Encounter (Signed)
Ok. We can refer her to neurology for her tremor. No other medicines to help this. Kc

## 2020-11-22 ENCOUNTER — Telehealth: Payer: Self-pay

## 2020-11-22 NOTE — Telephone Encounter (Signed)
Patient's daughter is going to discuss this with her Mom and call us back if she is willing to proceed with referral.

## 2020-11-22 NOTE — Telephone Encounter (Signed)
Dawn called back and she declined referral to neurology at this time.

## 2020-11-24 NOTE — Progress Notes (Signed)
Office Visit Note  Patient: Stephanie Clay             Date of Birth: Mar 09, 1932           MRN: [id number]             PCP: Stephanie Brome, MD Referring: Stephanie Brome, MD Visit Date: 12/08/2020 Occupation: '@GUAROCC' @  Subjective:  Pain in multiple joints   History of Present Illness: Stephanie Clay is a 87 y.o. female seen in consultation per request of her PCP for evaluation of joint pain.  According to the patient and her daughter Stephanie Clay she has had discomfort in multiple joints for the last 10 years.  She was diagnosed with degenerative disc disease of lumbar spine many years ago.  She continues to have lower back pain.  She was treated with hydrocodone in the past for pain management.  She continues to have pain and discomfort in her bilateral hands and her bilateral feet.  She states she has decreased grip strength.  She also feels that her hands swell at times.  None of the other joints are painful.  There is no family history of autoimmune disease.  Activities of Daily Living:  Patient reports morning stiffness for all day.  Patient Denies nocturnal pain.  Difficulty dressing/grooming: Denies Difficulty climbing stairs: Reports Difficulty getting out of chair: Denies Difficulty using hands for taps, buttons, cutlery, and/or writing: Reports  Review of Systems  Constitutional: Positive for fatigue. Negative for night sweats, weight gain and weight loss.  HENT: Positive for mouth sores. Negative for trouble swallowing, trouble swallowing, mouth dryness and nose dryness.   Eyes: Positive for visual disturbance. Negative for photophobia, pain, redness, itching and dryness.  Respiratory: Positive for cough and shortness of breath. Negative for wheezing and difficulty breathing.   Cardiovascular: Negative for chest pain, palpitations, hypertension, irregular heartbeat and swelling in legs/feet.  Gastrointestinal: Positive for constipation. Negative for blood  in stool and diarrhea.  Endocrine: Negative for increased urination.  Genitourinary: Negative for difficulty urinating and vaginal dryness.  Musculoskeletal: Positive for arthralgias, joint pain, joint swelling, myalgias, morning stiffness, muscle tenderness and myalgias. Negative for muscle weakness.  Skin: Negative for color change, rash, hair loss, redness, skin tightness, ulcers and sensitivity to sunlight.  Allergic/Immunologic: Negative for susceptible to infections.  Neurological: Positive for dizziness, tremors, headaches and memory loss. Negative for night sweats and weakness.  Hematological: Positive for bruising/bleeding tendency. Negative for swollen glands.  Psychiatric/Behavioral: Positive for confusion. Negative for depressed mood and sleep disturbance. The patient is not nervous/anxious.     PMFS History:  Patient Active Problem List   Diagnosis Date Noted  . Joint pain 03/24/2020  . Decreased pedal pulses 03/24/2020  . Severe protein-calorie malnutrition ([place]) 03/24/2020  . Normocytic anemia 01/07/2020  . Chronic bilateral low back pain without sciatica 11/29/2019  . Mild vitamin D deficiency 11/29/2019  . Localized edema 11/26/2019  . Other fatigue 11/26/2019  . Abdominal pain 01/27/2018  . Allergic rhinitis 08/20/2016  . Underweight 07/02/2016  . Hypomagnesemia 07/01/2016  . Hypokalemia 07/01/2016  . Hyperglycemia 07/01/2016  . Hypothyroidism 07/01/2016  . Essential hypertension 07/01/2016  . Hyperlipidemia 07/01/2016  . AKI (acute kidney injury) ([place]) 06/30/2016  . Dysphagia 03/01/2015  . Bronchiectasis with evidence of chronic atypical infection 01/03/2015    Past Medical History:  Diagnosis Date  . Anemia   . Aneurysm of other specified arteries ([place])   . Anxiety   . Arthritis   .  Asthma   . Bronchiectasis without acute exacerbation ([place]) 01/03/2015   CT chest 12/2014:  Bronchiectasis throughout right lung, left clear.  +tree and bud, + patchy gg  infiltrates in RLL Sputum 2016: no growth, AFB smear neg PFT's 02/2015:  Normal except mild decrease in DLCO   . Cataract    bilateral eye surgery  . Chronic chest pain   . Chronic idiopathic constipation   . Chronic kidney disease, stage 3 ([place])   . Chronic venous hypertension (idiopathic) with other complications of bilateral lower extremity   . Depression   . Gastro-esophageal reflux   . Heart murmur   . Hyperlipemia   . Hypertension   . Idiopathic progressive neuropathy   . Iron (Fe) deficiency anemia   . Moderate protein-calorie malnutrition ([place])   . Other idiopathic scoliosis, lumbar region   . Pneumathemia ([place])   . Primary generalized (osteo)arthritis   . Primary insomnia   . Radiculopathy, lumbar region   . Scoliosis   . Secondary hyperparathyroidism ([place])   . Thyroid disease   . Unilateral primary osteoarthritis, left knee   . Unilateral primary osteoarthritis, right knee   . Varicose veins    Bilateral leg  . Vitamin D deficiency, unspecified     Family History  Problem Relation Age of Onset  . Emphysema Father   . Heart disease Mother   . Cancer Sister        metastatic lung cancer  . Heart disease Sister   . Hypertension Sister   . Varicose Veins Sister   . Hyperlipidemia Daughter   . Hypertension Daughter   . Diabetes Daughter   . Hyperlipidemia Daughter   . Hypertension Daughter   . Thyroid disease Daughter   . Thyroid disease Daughter   . Colon cancer Neg Hx   . Esophageal cancer Neg Hx   . Stomach cancer Neg Hx    Past Surgical History:  Procedure Laterality Date  . ABDOMINAL HYSTERECTOMY    . CYSTOCELE REPAIR    . SALPINGOOPHORECTOMY     Social History   Social History Narrative   Right handed   Immunization History  Administered Date(s) Administered  . Influenza, High Dose Seasonal PF 06/28/2017  . Influenza,inj,Quad PF,6+ Mos 06/30/2015, 08/20/2016, 08/11/2020  . Influenza-Unspecified 07/22/2014, 07/23/2019  . Pneumococcal  Conjugate-13 06/20/2015  . Pneumococcal Polysaccharide-23 07/23/2007  . Tdap 02/20/2018     Objective: Vital Signs: BP 128/72 (BP Location: Right Arm, Patient Position: Sitting, Cuff Size: Small)   Pulse 79   Resp 13   Ht 5' (1.524 m)   Wt 72 lb 9.6 oz (32.9 kg)   BMI 14.18 kg/m    Physical Exam Vitals and nursing note reviewed.  Constitutional:      Appearance: She is well-developed and well-nourished.  HENT:     Head: Normocephalic and atraumatic.  Eyes:     Extraocular Movements: EOM normal.     Conjunctiva/sclera: Conjunctivae normal.  Cardiovascular:     Rate and Rhythm: Normal rate and regular rhythm.     Pulses: Intact distal pulses.     Heart sounds: Normal heart sounds.  Pulmonary:     Effort: Pulmonary effort is normal.     Breath sounds: Rales present.  Abdominal:     General: Bowel sounds are normal.     Palpations: Abdomen is soft.  Musculoskeletal:     Cervical back: Normal range of motion.  Lymphadenopathy:     Cervical: No cervical adenopathy.  Skin:  General: Skin is warm and dry.     Capillary Refill: Capillary refill takes less than 2 seconds.  Neurological:     Mental Status: She is alert and oriented to person, place, and time.  Psychiatric:        Mood and Affect: Mood and affect normal.        Behavior: Behavior normal.      Musculoskeletal Exam: C-spine was in good range of motion.  She has limited painful range of motion of her lumbar spine.  Shoulder joints, elbow joints, wrist joints, MCPs with good range of motion.  She has bilateral PIP DIP and CMC prominence with no synovitis.  Hip joints were difficult to assess in the sitting position.  Knee joints with good range of motion with no synovitis.  She had no tenderness over MTPs or PIPs.  No synovitis was noted.  CDAI Exam: CDAI Score: - Patient Global: -; Provider Global: - Swollen: -; Tender: - Joint Exam 12/08/2020   No joint exam has been documented for this visit   There is  currently no information documented on the homunculus. Go to the Rheumatology activity and complete the homunculus joint exam.  Investigation: No additional findings.  Imaging: XR Foot 2 Views Left  Result Date: 12/08/2020 Generalized osteopenia was noted.  First MTP narrowing and subluxation was noted.  PIP and DIP narrowing was noted.  No intertarsal or tibiotalar or subtalar joint space narrowing was noted.  Posterior calcaneal spur was noted.  No erosive changes were noted. Impression: These findings are consistent with osteoarthritis of the foot.  XR Foot 2 Views Right  Result Date: 12/08/2020 Generalized osteopenia was noted.  First MTP narrowing and subluxation was noted.  PIP and DIP narrowing was noted.  No intertarsal or tibiotalar or subtalar joint space narrowing was noted.  No erosive changes were noted. Impression: These findings are consistent with osteoarthritis of the foot.  XR Hand 2 View Left  Result Date: 12/08/2020 Generalized osteopenia was noted.  CMC narrowing and subluxation was noted.  First MCP subluxation was noted.  PIP and DIP narrowing was noted.  No other MCP joint, intercarpal or radiocarpal joint space narrowing was noted.  No erosive changes were noted. Impression: These findings are consistent with osteoarthritis of the hand.  XR Hand 2 View Right  Result Date: 12/08/2020 Generalized osteopenia was noted.  Chondrocalcinosis was noted in the wrist joint.  CMC, PIP and DIP narrowing was noted.  Subluxation of first MCP joint was noted.  No MCP, intercarpal radiocarpal joint space narrowing was noted.  No erosive changes were noted. Impression: These findings are consistent with osteoarthritis of the hand and chondrocalcinosis.  XR Lumbar Spine 2-3 Views  Result Date: 12/08/2020 Severe dextro scoliosis was noted.  Multilevel spondylosis was noted.  Severe narrowing was noted between L1-L2, L2-L3, L5-S1.  Facet joint arthropathy with foraminal narrowing was  noted.  SI joints could not be visualized. Impression: These findings are consistent with severe scoliosis, multilevel spondylosis and facet joint arthropathy.   Recent Labs: Lab Results  Component Value Date   WBC 13.9 (H) 10/03/2020   HGB 10.3 (L) 10/03/2020   PLT 421 10/03/2020   NA 141 10/03/2020   K 3.7 10/03/2020   CL 99 10/03/2020   CO2 26 10/03/2020   GLUCOSE 137 (H) 10/03/2020   BUN 33 (H) 10/03/2020   CREATININE 1.09 (H) 10/03/2020   BILITOT 0.2 10/03/2020   ALKPHOS 75 10/03/2020   AST 21 10/03/2020  ALT 15 10/03/2020   PROT 7.7 10/03/2020   ALBUMIN 3.6 10/03/2020   CALCIUM 9.7 10/03/2020   GFRAA 53 (L) 10/03/2020    Speciality Comments: No specialty comments available.  Procedures:  No procedures performed Allergies: Lisinopril, Remeron [mirtazapine], Ciprofloxacin, Levofloxacin, Nitrofurantoin, Omnicef [cefdinir], and Sertraline hcl   Assessment / Plan:     Visit Diagnoses: Pain in both hands - 03/08/20: ANA+, RF 14.6, ESR 33, uric acid 7.2. 08/11/20: RF 15.9, ESR 40, TSH 3.780, folate>20, vitamin b12>2000 -she complains of pain and discomfort in her bilateral hands.  There is no synovitis on examination.  Clinical findings are consistent with osteoarthritis.  Rheumatoid factor is borderline positive which is not significant and not unusual for age.  Sed rate is normal for her age and anemia.  Plan: XR Hand 2 View Right, XR Hand 2 View Left.  X-ray findings were discussed with the patient which were consistent with osteoarthritis.  Handout on hand muscle strengthening exercises was given.  Joint protection was discussed.  She was offered physical therapy but she would like to think more about it.  Primary osteoarthritis of both hands-joint protection muscle strengthening was discussed.  Pain in both feet -she complains of feet discomfort.  No synovitis was noted.  Plan: XR Foot 2 Views Right, XR Foot 2 Views Left.  X-rays were consistent with osteoarthritis.  DDD  (degenerative disc disease), lumbar -she complains of lower back pain.  I reviewed MRI of her lumbar spine from 2010 which was consistent with degenerative disc disease and facet joint arthropathy.  Plan: XR Lumbar Spine 2-3 Views.  X-ray showed severe dextroscoliosis, multilevel spondylosis and facet joint arthropathy.  She may benefit from pain management referral.  She would like to think about it.  A handout on back exercises was given.  Essential hypertension-blood pressure is well controlled.  History of hyperlipidemia  Bronchiectasis with evidence of chronic atypical infection-followed by pulmonologist.  Other specified hypothyroidism  Other fatigue  Normocytic anemia  Mild vitamin D deficiency-last vitamin D value was normal.  Patient is not vaccinated against COVID-19.  She does not want to get vaccinated.  She understands the risk of not getting vaccination.  Use of mask, social distancing and hand hygiene was discussed.   Orders: Orders Placed This Encounter  Procedures  . XR Hand 2 View Right  . XR Hand 2 View Left  . XR Foot 2 Views Right  . XR Foot 2 Views Left  . XR Lumbar Spine 2-3 Views   No orders of the defined types were placed in this encounter.    Follow-Up Instructions: Return if symptoms worsen or fail to improve, for Osteoarthritis.   Bo Merino, MD  Note - This record has been created using Editor, commissioning.  Chart creation errors have been sought, but may not always  have been located. Such creation errors do not reflect on  the standard of medical care.

## 2020-12-07 ENCOUNTER — Other Ambulatory Visit: Payer: Self-pay | Admitting: Family Medicine

## 2020-12-08 ENCOUNTER — Ambulatory Visit (INDEPENDENT_AMBULATORY_CARE_PROVIDER_SITE_OTHER): Payer: Medicare Other | Admitting: Rheumatology

## 2020-12-08 ENCOUNTER — Ambulatory Visit: Payer: Self-pay

## 2020-12-08 ENCOUNTER — Encounter: Payer: Self-pay | Admitting: Rheumatology

## 2020-12-08 ENCOUNTER — Other Ambulatory Visit: Payer: Self-pay

## 2020-12-08 VITALS — BP 128/72 | HR 79 | Resp 13 | Ht 60.0 in | Wt 72.6 lb

## 2020-12-08 DIAGNOSIS — Z8639 Personal history of other endocrine, nutritional and metabolic disease: Secondary | ICD-10-CM | POA: Diagnosis not present

## 2020-12-08 DIAGNOSIS — M79642 Pain in left hand: Secondary | ICD-10-CM | POA: Diagnosis not present

## 2020-12-08 DIAGNOSIS — D649 Anemia, unspecified: Secondary | ICD-10-CM | POA: Diagnosis not present

## 2020-12-08 DIAGNOSIS — M79672 Pain in left foot: Secondary | ICD-10-CM

## 2020-12-08 DIAGNOSIS — M79641 Pain in right hand: Secondary | ICD-10-CM | POA: Diagnosis not present

## 2020-12-08 DIAGNOSIS — I10 Essential (primary) hypertension: Secondary | ICD-10-CM

## 2020-12-08 DIAGNOSIS — M79671 Pain in right foot: Secondary | ICD-10-CM

## 2020-12-08 DIAGNOSIS — E559 Vitamin D deficiency, unspecified: Secondary | ICD-10-CM | POA: Diagnosis not present

## 2020-12-08 DIAGNOSIS — E876 Hypokalemia: Secondary | ICD-10-CM

## 2020-12-08 DIAGNOSIS — M5136 Other intervertebral disc degeneration, lumbar region: Secondary | ICD-10-CM

## 2020-12-08 DIAGNOSIS — R5383 Other fatigue: Secondary | ICD-10-CM | POA: Diagnosis not present

## 2020-12-08 DIAGNOSIS — E038 Other specified hypothyroidism: Secondary | ICD-10-CM | POA: Diagnosis not present

## 2020-12-08 DIAGNOSIS — M2550 Pain in unspecified joint: Secondary | ICD-10-CM

## 2020-12-08 DIAGNOSIS — M19041 Primary osteoarthritis, right hand: Secondary | ICD-10-CM

## 2020-12-08 DIAGNOSIS — M19042 Primary osteoarthritis, left hand: Secondary | ICD-10-CM

## 2020-12-08 DIAGNOSIS — J471 Bronchiectasis with (acute) exacerbation: Secondary | ICD-10-CM

## 2020-12-08 NOTE — Patient Instructions (Signed)
Hand Exercises Hand exercises can be helpful for almost anyone. These exercises can strengthen the hands, improve flexibility and movement, and increase blood flow to the hands. These results can make work and daily tasks easier. Hand exercises can be especially helpful for people who have joint pain from arthritis or have nerve damage from overuse (carpal tunnel syndrome). These exercises can also help people who have injured a hand. Exercises Most of these hand exercises are gentle stretching and motion exercises. It is usually safe to do them often throughout the day. Warming up your hands before exercise may help to reduce stiffness. You can do this with gentle massage or by placing your hands in warm water for 10-15 minutes. It is normal to feel some stretching, pulling, tightness, or mild discomfort as you begin new exercises. This will gradually improve. Stop an exercise right away if you feel sudden, severe pain or your pain gets worse. Ask your health care provider which exercises are best for you. Knuckle bend or "claw" fist 1. Stand or sit with your arm, hand, and all five fingers pointed straight up. Make sure to keep your wrist straight during the exercise. 2. Gently bend your fingers down toward your palm until the tips of your fingers are touching the top of your palm. Keep your big knuckle straight and just bend the small knuckles in your fingers. 3. Hold this position for __________ seconds. 4. Straighten (extend) your fingers back to the starting position. Repeat this exercise 5-10 times with each hand. Full finger fist 1. Stand or sit with your arm, hand, and all five fingers pointed straight up. Make sure to keep your wrist straight during the exercise. 2. Gently bend your fingers into your palm until the tips of your fingers are touching the middle of your palm. 3. Hold this position for __________ seconds. 4. Extend your fingers back to the starting position, stretching every  joint fully. Repeat this exercise 5-10 times with each hand. Straight fist 1. Stand or sit with your arm, hand, and all five fingers pointed straight up. Make sure to keep your wrist straight during the exercise. 2. Gently bend your fingers at the big knuckle, where your fingers meet your hand, and the middle knuckle. Keep the knuckle at the tips of your fingers straight and try to touch the bottom of your palm. 3. Hold this position for __________ seconds. 4. Extend your fingers back to the starting position, stretching every joint fully. Repeat this exercise 5-10 times with each hand. Tabletop 1. Stand or sit with your arm, hand, and all five fingers pointed straight up. Make sure to keep your wrist straight during the exercise. 2. Gently bend your fingers at the big knuckle, where your fingers meet your hand, as far down as you can while keeping the small knuckles in your fingers straight. Think of forming a tabletop with your fingers. 3. Hold this position for __________ seconds. 4. Extend your fingers back to the starting position, stretching every joint fully. Repeat this exercise 5-10 times with each hand. Finger spread 1. Place your hand flat on a table with your palm facing down. Make sure your wrist stays straight as you do this exercise. 2. Spread your fingers and thumb apart from each other as far as you can until you feel a gentle stretch. Hold this position for __________ seconds. 3. Bring your fingers and thumb tight together again. Hold this position for __________ seconds. Repeat this exercise 5-10 times with each hand.   Making circles 1. Stand or sit with your arm, hand, and all five fingers pointed straight up. Make sure to keep your wrist straight during the exercise. 2. Make a circle by touching the tip of your thumb to the tip of your index finger. 3. Hold for __________ seconds. Then open your hand wide. 4. Repeat this motion with your thumb and each finger on your  hand. Repeat this exercise 5-10 times with each hand. Thumb motion 1. Sit with your forearm resting on a table and your wrist straight. Your thumb should be facing up toward the ceiling. Keep your fingers relaxed as you move your thumb. 2. Lift your thumb up as high as you can toward the ceiling. Hold for __________ seconds. 3. Bend your thumb across your palm as far as you can, reaching the tip of your thumb for the small finger (pinkie) side of your palm. Hold for __________ seconds. Repeat this exercise 5-10 times with each hand. Grip strengthening 1. Hold a stress ball or other soft ball in the middle of your hand. 2. Slowly increase the pressure, squeezing the ball as much as you can without causing pain. Think of bringing the tips of your fingers into the middle of your palm. All of your finger joints should bend when doing this exercise. 3. Hold your squeeze for __________ seconds, then relax. Repeat this exercise 5-10 times with each hand.   Contact a health care provider if:  Your hand pain or discomfort gets much worse when you do an exercise.  Your hand pain or discomfort does not improve within 2 hours after you exercise. If you have any of these problems, stop doing these exercises right away. Do not do them again unless your health care provider says that you can. Get help right away if:  You develop sudden, severe hand pain or swelling. If this happens, stop doing these exercises right away. Do not do them again unless your health care provider says that you can. This information is not intended to replace advice given to you by your health care provider. Make sure you discuss any questions you have with your health care provider. Document Revised: 01/29/2019 Document Reviewed: 10/09/2018 [employer] Patient Education  2021 [employer] Back Exercises The following exercises strengthen the muscles that help to support the trunk and back. They also help to keep the lower back  flexible. Doing these exercises can help to prevent back pain or lessen existing pain.  If you have back pain or discomfort, try doing these exercises 2-3 times each day or as told by your health care provider.  As your pain improves, do them once each day, but increase the number of times that you repeat the steps for each exercise (do more repetitions).  To prevent the recurrence of back pain, continue to do these exercises once each day or as told by your health care provider. Do exercises exactly as told by your health care provider and adjust them as directed. It is normal to feel mild stretching, pulling, tightness, or discomfort as you do these exercises, but you should stop right away if you feel sudden pain or your pain gets worse. Exercises Single knee to chest Repeat these steps 3-5 times for each leg: 1. Lie on your back on a firm bed or the floor with your legs extended. 2. Bring one knee to your chest. Your other leg should stay extended and in contact with the floor. 3. Hold your knee in place by   grabbing your knee or thigh with both hands and hold. 4. Pull on your knee until you feel a gentle stretch in your lower back or buttocks. 5. Hold the stretch for 10-30 seconds. 6. Slowly release and straighten your leg. Pelvic tilt Repeat these steps 5-10 times: 1. Lie on your back on a firm bed or the floor with your legs extended. 2. Bend your knees so they are pointing toward the ceiling and your feet are flat on the floor. 3. Tighten your lower abdominal muscles to press your lower back against the floor. This motion will tilt your pelvis so your tailbone points up toward the ceiling instead of pointing to your feet or the floor. 4. With gentle tension and even breathing, hold this position for 5-10 seconds. Cat-cow Repeat these steps until your lower back becomes more flexible: 1. Get into a hands-and-knees position on a firm surface. Keep your hands under your shoulders, and  keep your knees under your hips. You may place padding under your knees for comfort. 2. Let your head hang down toward your chest. Contract your abdominal muscles and point your tailbone toward the floor so your lower back becomes rounded like the back of a cat. 3. Hold this position for 5 seconds. 4. Slowly lift your head, let your abdominal muscles relax and point your tailbone up toward the ceiling so your back forms a sagging arch like the back of a cow. 5. Hold this position for 5 seconds.   Press-ups Repeat these steps 5-10 times: 1. Lie on your abdomen (face-down) on the floor. 2. Place your palms near your head, about shoulder-width apart. 3. Keeping your back as relaxed as possible and keeping your hips on the floor, slowly straighten your arms to raise the top half of your body and lift your shoulders. Do not use your back muscles to raise your upper torso. You may adjust the placement of your hands to make yourself more comfortable. 4. Hold this position for 5 seconds while you keep your back relaxed. 5. Slowly return to lying flat on the floor.   Bridges Repeat these steps 10 times: 1. Lie on your back on a firm surface. 2. Bend your knees so they are pointing toward the ceiling and your feet are flat on the floor. Your arms should be flat at your sides, next to your body. 3. Tighten your buttocks muscles and lift your buttocks off the floor until your waist is at almost the same height as your knees. You should feel the muscles working in your buttocks and the back of your thighs. If you do not feel these muscles, slide your feet 1-2 inches farther away from your buttocks. 4. Hold this position for 3-5 seconds. 5. Slowly lower your hips to the starting position, and allow your buttocks muscles to relax completely. If this exercise is too easy, try doing it with your arms crossed over your chest.   Abdominal crunches Repeat these steps 5-10 times: 1. Lie on your back on a firm bed  or the floor with your legs extended. 2. Bend your knees so they are pointing toward the ceiling and your feet are flat on the floor. 3. Cross your arms over your chest. 4. Tip your chin slightly toward your chest without bending your neck. 5. Tighten your abdominal muscles and slowly raise your trunk (torso) high enough to lift your shoulder blades a tiny bit off the floor. Avoid raising your torso higher than that because it can   put too much stress on your low back and does not help to strengthen your abdominal muscles. 6. Slowly return to your starting position. Back lifts Repeat these steps 5-10 times: 1. Lie on your abdomen (face-down) with your arms at your sides, and rest your forehead on the floor. 2. Tighten the muscles in your legs and your buttocks. 3. Slowly lift your chest off the floor while you keep your hips pressed to the floor. Keep the back of your head in line with the curve in your back. Your eyes should be looking at the floor. 4. Hold this position for 3-5 seconds. 5. Slowly return to your starting position. Contact a health care provider if:  Your back pain or discomfort gets much worse when you do an exercise.  Your worsening back pain or discomfort does not lessen within 2 hours after you exercise. If you have any of these problems, stop doing these exercises right away. Do not do them again unless your health care provider says that you can. Get help right away if:  You develop sudden, severe back pain. If this happens, stop doing the exercises right away. Do not do them again unless your health care provider says that you can. This information is not intended to replace advice given to you by your health care provider. Make sure you discuss any questions you have with your health care provider. Document Revised: 02/12/2019 Document Reviewed: 07/10/2018 [employer] Patient Education  2021 [employer]  

## 2020-12-12 ENCOUNTER — Other Ambulatory Visit: Payer: Self-pay | Admitting: Family Medicine

## 2020-12-12 ENCOUNTER — Other Ambulatory Visit: Payer: Self-pay | Admitting: Physician Assistant

## 2020-12-12 DIAGNOSIS — J449 Chronic obstructive pulmonary disease, unspecified: Secondary | ICD-10-CM

## 2020-12-14 ENCOUNTER — Telehealth: Payer: Self-pay

## 2020-12-14 NOTE — Telephone Encounter (Signed)
Patient's daughter Stephanie Clay called stating her mom had an appointment last week and was given exercises which have helped.  She states Dr. Corliss Skains discussed possibly referring her to physical therapy or pain management if her symptoms did not improve.  She requested we call her mom at [phone] and let her know how to proceed.

## 2020-12-14 NOTE — Telephone Encounter (Signed)
Spoke with patient and she states the hand exercises are working. She states she does not want to be referred to physical therapy or pain management. Patient states she will continue to do the exercises that were provided at her appointment.

## 2020-12-26 ENCOUNTER — Other Ambulatory Visit: Payer: Self-pay | Admitting: Physician Assistant

## 2020-12-26 ENCOUNTER — Other Ambulatory Visit: Payer: Self-pay | Admitting: Family Medicine

## 2020-12-29 ENCOUNTER — Ambulatory Visit: Payer: Medicare Other | Admitting: Rheumatology

## 2021-01-09 ENCOUNTER — Telehealth: Payer: Self-pay

## 2021-01-09 ENCOUNTER — Other Ambulatory Visit: Payer: Self-pay

## 2021-01-09 NOTE — Telephone Encounter (Signed)
Stephanie Clay called requesting a refill on vicodin.  I do not see a recent pain medication in her list.

## 2021-01-10 ENCOUNTER — Other Ambulatory Visit: Payer: Self-pay | Admitting: Family Medicine

## 2021-01-10 MED ORDER — HYDROCODONE-ACETAMINOPHEN 5-325 MG PO TABS: 1.0000 | ORAL_TABLET | Freq: Two times a day (BID) | ORAL | 0 refills | Status: DC | PRN

## 2021-01-10 NOTE — Telephone Encounter (Signed)
Sent refill of hydrocodone/apap 5/325 mg one twice a day prn severe pain. 60/0. This is less than the tid dosing in the past. I have tallked with her about my concerns about sedation and increase in falls. Kc

## 2021-01-11 ENCOUNTER — Other Ambulatory Visit: Payer: Self-pay | Admitting: Legal Medicine

## 2021-01-11 NOTE — Telephone Encounter (Signed)
I think this is your patient lp

## 2021-01-12 ENCOUNTER — Other Ambulatory Visit: Payer: Self-pay | Admitting: Physician Assistant

## 2021-01-12 DIAGNOSIS — J449 Chronic obstructive pulmonary disease, unspecified: Secondary | ICD-10-CM

## 2021-01-30 ENCOUNTER — Other Ambulatory Visit: Payer: Self-pay | Admitting: Family Medicine

## 2021-01-31 ENCOUNTER — Other Ambulatory Visit: Payer: Self-pay | Admitting: Physician Assistant

## 2021-02-16 ENCOUNTER — Encounter: Payer: Self-pay | Admitting: Family Medicine

## 2021-02-16 ENCOUNTER — Ambulatory Visit (INDEPENDENT_AMBULATORY_CARE_PROVIDER_SITE_OTHER): Payer: Medicare Other | Admitting: Family Medicine

## 2021-02-16 ENCOUNTER — Other Ambulatory Visit: Payer: Self-pay

## 2021-02-16 ENCOUNTER — Telehealth: Payer: Self-pay | Admitting: Family Medicine

## 2021-02-16 VITALS — BP 144/64 | HR 72 | Temp 97.3°F | Resp 16 | Ht 60.0 in | Wt 72.2 lb

## 2021-02-16 DIAGNOSIS — R9389 Abnormal findings on diagnostic imaging of other specified body structures: Secondary | ICD-10-CM

## 2021-02-16 DIAGNOSIS — R197 Diarrhea, unspecified: Secondary | ICD-10-CM | POA: Diagnosis not present

## 2021-02-16 DIAGNOSIS — R109 Unspecified abdominal pain: Secondary | ICD-10-CM | POA: Diagnosis not present

## 2021-02-16 DIAGNOSIS — R140 Abdominal distension (gaseous): Secondary | ICD-10-CM | POA: Diagnosis not present

## 2021-02-16 DIAGNOSIS — Z8719 Personal history of other diseases of the digestive system: Secondary | ICD-10-CM | POA: Diagnosis not present

## 2021-02-16 DIAGNOSIS — K909 Intestinal malabsorption, unspecified: Secondary | ICD-10-CM

## 2021-02-16 DIAGNOSIS — I2510 Atherosclerotic heart disease of native coronary artery without angina pectoris: Secondary | ICD-10-CM | POA: Diagnosis not present

## 2021-02-16 DIAGNOSIS — E43 Unspecified severe protein-calorie malnutrition: Secondary | ICD-10-CM | POA: Diagnosis not present

## 2021-02-16 DIAGNOSIS — I10 Essential (primary) hypertension: Secondary | ICD-10-CM

## 2021-02-16 DIAGNOSIS — R10817 Generalized abdominal tenderness: Secondary | ICD-10-CM | POA: Diagnosis not present

## 2021-02-16 DIAGNOSIS — J449 Chronic obstructive pulmonary disease, unspecified: Secondary | ICD-10-CM | POA: Diagnosis not present

## 2021-02-16 DIAGNOSIS — R0609 Other forms of dyspnea: Secondary | ICD-10-CM

## 2021-02-16 DIAGNOSIS — K921 Melena: Secondary | ICD-10-CM

## 2021-02-16 DIAGNOSIS — E782 Mixed hyperlipidemia: Secondary | ICD-10-CM | POA: Diagnosis not present

## 2021-02-16 DIAGNOSIS — R0600 Dyspnea, unspecified: Secondary | ICD-10-CM

## 2021-02-16 DIAGNOSIS — I700 Atherosclerosis of aorta: Secondary | ICD-10-CM | POA: Diagnosis not present

## 2021-02-16 DIAGNOSIS — J479 Bronchiectasis, uncomplicated: Secondary | ICD-10-CM | POA: Diagnosis not present

## 2021-02-16 LAB — POCT URINALYSIS DIP (CLINITEK)
Bilirubin, UA: NEGATIVE
Blood, UA: NEGATIVE
Glucose, UA: NEGATIVE mg/dL
Ketones, POC UA: NEGATIVE mg/dL
Nitrite, UA: NEGATIVE
POC PROTEIN,UA: NEGATIVE
Spec Grav, UA: 1.015 (ref 1.010–1.025)
Urobilinogen, UA: 0.2 E.U./dL
pH, UA: 6 (ref 5.0–8.0)

## 2021-02-16 NOTE — Patient Instructions (Addendum)
Double check if taking amitiza. If so, stop it.  Decrease linzess 145 mg once daily in am.  Order stool studies.  Change trelegy to breztri 2 puffs twice a day. Still needs rinse mouth out . Ordering ct scan of abdomen/pelvis/chest. Ordering Labs to get at hospital.  disease, we suggest a three-times-weekly regimen of: ?Azithromycin (500 mg three times per week) PLUS ?Rifampin (600 mg three times per week) PLUS ?Ethambutol (25 mg/kg three times per week)

## 2021-02-16 NOTE — Progress Notes (Signed)
  Chronic Care Management   Outreach Note  02/16/2021 Name: Krisanne Lich MRN: [id number] DOB: [birth year]  Referred by: Blane Ohara, MD Reason for referral : No chief complaint on file.   An unsuccessful telephone outreach was attempted today. The patient was referred to the pharmacist for assistance with care management and care coordination.   Follow Up Plan:   Carley Porcher [employer] Scheduler

## 2021-02-16 NOTE — Progress Notes (Signed)
Subjective:  Patient ID: Stephanie Clay, female    DOB: 1932-06-03  Age: 87 y.o. MRN: [id number]  Chief Complaint  Patient presents with  . Hypothyroidism  . Hyperlipidemia  . Tremors    HPI Patient is an 87 yo female with hypertension, hypothyroidism, COPD/asthma, hyperlipidemia, depression, chronic pain, who presents for chronic follow up. Pt is feeling terrible.  Complaining of DOE, fatigue, and cough. Patient does not like the trelegy. It leaves a powder on the top of her mouth. Tremors: pt did not benefit from primidone.  Complaining of abdominal pain/bloating/diarrhea. Pt is currently on linzess 290 mcg once daily.  Dicyclomine qac and qhs which helps. Reports diarrhea numerous times per day. Has mucous with stool.  Hyperlipidemia: currently on simvastatin. Depression is poorly controlled. Pt refuses counseling. She is currently on effexor xr 75 mg once in am and xanax 0.5 mg one twice daily prn anxiety. I have tried pt on numerous antidepressants over the last 10+ yrs and all help for a time and then stop.  Hypertension: Hydrochlorothiazide and torsemide.  This is also for chronic swelling of lower extremities.  Patient has a lot of varicosities in her legs that bother her.  Current Outpatient Medications on File Prior to Visit  Medication Sig Dispense Refill  . HYDROcodone-acetaminophen (NORCO/VICODIN) 5-325 MG tablet Take 1 tablet by mouth 2 (two) times daily as needed for severe pain (OSteoarthritis of lumbar spine.). 60 tablet 0  . ALPRAZolam (XANAX) 0.5 MG tablet TAKE 1 TABLET BY MOUTH TWICE A DAY AS NEEDED 60 tablet 1  . Cholecalciferol (VITAMIN D3 PO) Take by mouth daily.    . Cyanocobalamin 2500 MCG TABS Take by mouth daily.    . diclofenac Sodium (VOLTAREN) 1 % GEL Apply topically 4 (four) times daily.    Marland Kitchen dicyclomine (BENTYL) 10 MG capsule TAKE 1 CAPSULE BY MOUTH BEFORE EACH MEAL AND AT BEDTIME AS NEEDED FOR COLON SPASM 120 capsule 0  . fluticasone  (FLONASE) 50 MCG/ACT nasal spray USE 2 SPRAYS IN EACH NOSTRIL DAILY. 16 g 4  . Fluticasone-Umeclidin-Vilant (TRELEGY ELLIPTA) 100-62.5-25 MCG/INH AEPB Inhale 1 puff into the lungs daily. 1 each 2  . gabapentin (NEURONTIN) 300 MG capsule Take 1 capsule (300 mg total) by mouth 3 (three) times daily. 90 capsule 3  . hydrochlorothiazide (HYDRODIURIL) 25 MG tablet TAKE 1 TABLET BY MOUTH DAILY 90 tablet 0  . hydrOXYzine (ATARAX/VISTARIL) 25 MG tablet Take 25 mg by mouth 3 (three) times daily as needed.    Marland Kitchen levothyroxine (SYNTHROID) 50 MCG tablet TAKE 1 TABLET BY MOUTH DAILY 90 tablet 0  . lidocaine (LIDODERM) 5 % Place 3 patches onto the skin every 12 (twelve) hours.    Marland Kitchen LINZESS 290 MCG CAPS capsule Take 290 mcg by mouth every morning.    . meclizine (ANTIVERT) 12.5 MG tablet TAKE 1 TABLET EVERY 8 HOURS AS NEEDED FOR DIZZINESS 40 tablet 0  . nitroGLYCERIN (NITROSTAT) 0.4 MG SL tablet Place 0.4 mg under the tongue every 5 (five) minutes as needed for chest pain. X 3 doses    . ondansetron (ZOFRAN) 4 MG tablet TAKE 1 TABLET BY MOUTH THREE TIMES A DAY FOR NAUSEA. 90 tablet 0  . pantoprazole (PROTONIX) 40 MG tablet TAKE 1 TABLET TWICE DAILY 180 tablet 1  . potassium chloride SA (KLOR-CON) 20 MEQ tablet TAKE 3 TABLETS BY MOUTH ONCE DAILY. 90 tablet 0  . simvastatin (ZOCOR) 40 MG tablet TAKE 1 TABLET BY MOUTH DAILY 90 tablet 0  .  torsemide (DEMADEX) 20 MG tablet TAKE 1 TABLET BY MOUTH DAILY 5-6 HOURS AFTER FIRST DOSE OF HYDROCHLOROTHIAZIDE 90 tablet 2  . triamcinolone (KENALOG) 0.1 % paste Use as directed 1 application in the mouth or throat 2 (two) times daily. 5 g 0  . venlafaxine XR (EFFEXOR-XR) 75 MG 24 hr capsule TAKE 1 CAPSULE BY MOUTH DAILY WITH BREAKFAST 30 capsule 0   No current facility-administered medications on file prior to visit.   Past Medical History:  Diagnosis Date  . Anemia   . Aneurysm of other specified arteries (HCC)   . Anxiety   . Arthritis   . Asthma   . Bronchiectasis  without acute exacerbation (HCC) 01/03/2015   CT chest 12/2014:  Bronchiectasis throughout right lung, left clear.  +tree and bud, + patchy gg infiltrates in RLL Sputum 2016: no growth, AFB smear neg PFT's 02/2015:  Normal except mild decrease in DLCO   . Cataract    bilateral eye surgery  . Chronic chest pain   . Chronic idiopathic constipation   . Chronic kidney disease, stage 3 (HCC)   . Chronic venous hypertension (idiopathic) with other complications of bilateral lower extremity   . Depression   . Gastro-esophageal reflux   . Heart murmur   . Hyperlipemia   . Hypertension   . Idiopathic progressive neuropathy   . Iron (Fe) deficiency anemia   . Moderate protein-calorie malnutrition (HCC)   . Other idiopathic scoliosis, lumbar region   . Pneumathemia (HCC)   . Primary generalized (osteo)arthritis   . Primary insomnia   . Radiculopathy, lumbar region   . Scoliosis   . Secondary hyperparathyroidism (HCC)   . Thyroid disease   . Unilateral primary osteoarthritis, left knee   . Unilateral primary osteoarthritis, right knee   . Varicose veins    Bilateral leg  . Vitamin D deficiency, unspecified    Past Surgical History:  Procedure Laterality Date  . ABDOMINAL HYSTERECTOMY    . CYSTOCELE REPAIR    . SALPINGOOPHORECTOMY      Family History  Problem Relation Age of Onset  . Emphysema Father   . Heart disease Mother   . Cancer Sister        metastatic lung cancer  . Heart disease Sister   . Hypertension Sister   . Varicose Veins Sister   . Hyperlipidemia Daughter   . Hypertension Daughter   . Diabetes Daughter   . Hyperlipidemia Daughter   . Hypertension Daughter   . Thyroid disease Daughter   . Thyroid disease Daughter   . Colon cancer Neg Hx   . Esophageal cancer Neg Hx   . Stomach cancer Neg Hx    Social History   Socioeconomic History  . Marital status: Widowed    Spouse name: carl  . Number of children: 3  . Years of education: 9  . Highest education  level: Not on file  Occupational History  . Occupation: retired  Tobacco Use  . Smoking status: Never Smoker  . Smokeless tobacco: Never Used  Vaping Use  . Vaping Use: Never used  Substance and Sexual Activity  . Alcohol use: No    Alcohol/week: 0.0 standard drinks  . Drug use: No  . Sexual activity: Not Currently  Other Topics Concern  . Not on file  Social History Narrative   Right handed   Social Determinants of Health   Financial Resource Strain: Not on file  Food Insecurity: Not on file  Transportation Needs: Not on file  Physical Activity: Not on file  Stress: Not on file  Social Connections: Not on file    Review of Systems  Constitutional: Positive for appetite change and fatigue. Negative for chills and fever.  HENT: Negative for congestion, ear pain and sore throat.   Eyes: Positive for visual disturbance.  Respiratory: Positive for cough and shortness of breath.   Cardiovascular: Negative for chest pain.  Gastrointestinal: Positive for abdominal distention (bloating) and diarrhea (mucous.). Negative for abdominal pain, constipation, nausea and vomiting.  Endocrine: Positive for polydipsia.  Genitourinary: Negative for dysuria and urgency.  Musculoskeletal: Negative for arthralgias and myalgias.  Skin: Negative for rash.  Neurological: Positive for headaches. Negative for dizziness.  Psychiatric/Behavioral: Negative for dysphoric mood. The patient is not nervous/anxious.      Objective:  BP (!) 144/64   Pulse 72   Temp (!) 97.3 F (36.3 C)   Resp 16   Ht 5' (1.524 m)   Wt 72 lb 3.2 oz (32.7 kg)   BMI 14.10 kg/m   BP/Weight 02/16/2021 12/08/2020 11/16/2020  Systolic BP 144 128 136  Diastolic BP 64 72 60  Wt. (Lbs) 72.2 72.6 72  BMI 14.1 14.18 14.54    Physical Exam Vitals reviewed.  Constitutional:      Comments: thin  Cardiovascular:     Rate and Rhythm: Normal rate and regular rhythm.  Pulmonary:     Effort: Pulmonary effort is normal. No  respiratory distress.     Breath sounds: Rhonchi and rales present.  Abdominal:     General: There is distension.     Palpations: Abdomen is soft.     Tenderness: There is abdominal tenderness (generalized). There is no rebound.  Neurological:     Mental Status: She is alert.  Psychiatric:        Behavior: Behavior normal.     Comments: depressed     Diabetic Foot Exam - Simple   No data filed      Lab Results  Component Value Date   WBC 13.9 (H) 10/03/2020   HGB 10.3 (L) 10/03/2020   HCT 30.3 (L) 10/03/2020   PLT 421 10/03/2020   GLUCOSE 137 (H) 10/03/2020   CHOL 193 09/09/2020   TRIG 71 09/09/2020   HDL 58 09/09/2020   LDLCALC 122 (H) 09/09/2020   ALT 15 10/03/2020   AST 21 10/03/2020   NA 141 10/03/2020   K 3.7 10/03/2020   CL 99 10/03/2020   CREATININE 1.09 (H) 10/03/2020   BUN 33 (H) 10/03/2020   CO2 26 10/03/2020   TSH 4.370 10/03/2020   HGBA1C 5.5 03/08/2020      Assessment & Plan:   1. Diarrhea due to malabsorption Double check if taking amitiza. If so, stop it.  Decrease linzess 145 mg once daily in am. Samples given.  Order stool studies.   2. Severe protein-calorie malnutrition (HCC) Recommend eat 3 meals per day.  3. Essential hypertension, benign The current medical regimen is effective;  continue present plan and medications. - Comprehensive metabolic panel - CBC with Differential/Platelet  4. Mixed hyperlipidemia The current medical regimen is effective;  continue present plan and medications.  5. Dyspnea on exertion - CT Chest W Contrast  6. Generalized abdominal tenderness without rebound tenderness - POCT URINALYSIS DIP (CLINITEK) - CT ABDOMEN PELVIS W CONTRAST  7. Abdominal distension - CT ABDOMEN PELVIS W CONTRAST  8. Blood in stool Intermittent  9. Chronic obstructive pulmonary disease, unspecified COPD type (HCC) Change trelegy to breztri  10. Abdominal distension (gaseous) - CT ABDOMEN PELVIS W CONTRAST    Orders  Placed This Encounter  Procedures  . CT ABDOMEN PELVIS W CONTRAST  . CT Chest W Contrast  . Comprehensive metabolic panel  . CBC with Differential/Platelet  . CCM pharmacy monitoring  . POCT URINALYSIS DIP (CLINITEK)    I spent over 40 minutes with face to face evaluation as well as review of testing and discussion with Dr. Delton Coombes.  Follow-up: Return in about 4 weeks (around 03/16/2021).  An After Visit Summary was printed and given to the patient.  Blane Ohara, MD [facility] [phone]  ADDENDUM:  CT OF ABD/PELVIS SHOWED AORTIC ATHEROSCLEROSIS AND MODERATE AMOUNT OF STOOL.  CT OF CHEST: CONCERNING FOR MAI. I spoke with Dr. Delton Coombes, her pulmonologist she saw in 2019. He is going to speak with his office and get her an appointment for next week. I am going to have come in am and I intend to do a rectal exam to see if pt may be impacted. Also will have her go [facility] with order for sputum with AFB stain, gram stain, and culture.  I discussed with patient and pulmonary office had already called to make her an appointment next Friday!

## 2021-02-17 ENCOUNTER — Encounter: Payer: Self-pay | Admitting: Family Medicine

## 2021-02-17 ENCOUNTER — Ambulatory Visit (INDEPENDENT_AMBULATORY_CARE_PROVIDER_SITE_OTHER): Payer: Medicare Other | Admitting: Family Medicine

## 2021-02-17 VITALS — BP 144/80 | HR 84 | Temp 97.3°F | Resp 16

## 2021-02-17 DIAGNOSIS — F331 Major depressive disorder, recurrent, moderate: Secondary | ICD-10-CM | POA: Diagnosis not present

## 2021-02-17 DIAGNOSIS — K5904 Chronic idiopathic constipation: Secondary | ICD-10-CM | POA: Diagnosis not present

## 2021-02-17 DIAGNOSIS — R9389 Abnormal findings on diagnostic imaging of other specified body structures: Secondary | ICD-10-CM | POA: Diagnosis not present

## 2021-02-17 MED ORDER — VENLAFAXINE HCL ER 150 MG PO TB24: 150.0000 mg | ORAL_TABLET | Freq: Every morning | ORAL | 1 refills | Status: DC

## 2021-02-17 MED ORDER — BREZTRI AEROSPHERE 160-9-4.8 MCG/ACT IN AERO: 2.0000 | INHALATION_SPRAY | Freq: Two times a day (BID) | RESPIRATORY_TRACT | 3 refills | Status: DC

## 2021-02-17 MED ORDER — HYDROCODONE-ACETAMINOPHEN 5-325 MG PO TABS: 1.0000 | ORAL_TABLET | Freq: Two times a day (BID) | ORAL | 0 refills | Status: DC | PRN

## 2021-02-17 NOTE — Patient Instructions (Signed)
Increase effexor x 150 mg once daily in am.  Continue linzess 145 mg once daily. If sxs worsening, increase back to Linzess 290 mg once daily in AM  Go To [facility] for sputum stain and culture (AFB)

## 2021-02-17 NOTE — Progress Notes (Signed)
Acute Office Visit  Subjective:    Patient ID: Stephanie Clay, female    DOB: January 17, 1932, 87 y.o.   MRN: [id number]  Chief Complaint  Patient presents with  . Constipation    HPI Patient is in today for follow up from yesterday. Patient's ct scan of abdomen/pelvis showed large amount of stool. Pt had complained of loose stools. I had pt come back to do a rectal exam to evaluate for impaction with stool leakage.   CT of chest showed concerning for MAI. I am sending pt to hospital for sputum collection and AFB STAINING and culture.   Past Medical History:  Diagnosis Date  . Anemia   . Aneurysm of other specified arteries (HCC)   . Anxiety   . Arthritis   . Asthma   . Bronchiectasis without acute exacerbation (HCC) 01/03/2015   CT chest 12/2014:  Bronchiectasis throughout right lung, left clear.  +tree and bud, + patchy gg infiltrates in RLL Sputum 2016: no growth, AFB smear neg PFT's 02/2015:  Normal except mild decrease in DLCO   . Cataract    bilateral eye surgery  . Chronic chest pain   . Chronic idiopathic constipation   . Chronic kidney disease, stage 3 (HCC)   . Chronic venous hypertension (idiopathic) with other complications of bilateral lower extremity   . Depression   . Gastro-esophageal reflux   . Heart murmur   . Hyperlipemia   . Hypertension   . Idiopathic progressive neuropathy   . Iron (Fe) deficiency anemia   . Moderate protein-calorie malnutrition (HCC)   . Other idiopathic scoliosis, lumbar region   . Pneumathemia (HCC)   . Primary generalized (osteo)arthritis   . Primary insomnia   . Radiculopathy, lumbar region   . Scoliosis   . Secondary hyperparathyroidism (HCC)   . Thyroid disease   . Unilateral primary osteoarthritis, left knee   . Unilateral primary osteoarthritis, right knee   . Varicose veins    Bilateral leg  . Vitamin D deficiency, unspecified     Past Surgical History:  Procedure Laterality Date  . ABDOMINAL HYSTERECTOMY     . CYSTOCELE REPAIR    . SALPINGOOPHORECTOMY      Family History  Problem Relation Age of Onset  . Emphysema Father   . Heart disease Mother   . Cancer Sister        metastatic lung cancer  . Heart disease Sister   . Hypertension Sister   . Varicose Veins Sister   . Hyperlipidemia Daughter   . Hypertension Daughter   . Diabetes Daughter   . Hyperlipidemia Daughter   . Hypertension Daughter   . Thyroid disease Daughter   . Thyroid disease Daughter   . Colon cancer Neg Hx   . Esophageal cancer Neg Hx   . Stomach cancer Neg Hx     Social History   Socioeconomic History  . Marital status: Widowed    Spouse name: carl  . Number of children: 3  . Years of education: 9  . Highest education level: Not on file  Occupational History  . Occupation: retired  Tobacco Use  . Smoking status: Never Smoker  . Smokeless tobacco: Never Used  Vaping Use  . Vaping Use: Never used  Substance and Sexual Activity  . Alcohol use: No    Alcohol/week: 0.0 standard drinks  . Drug use: No  . Sexual activity: Not Currently  Other Topics Concern  . Not on file  Social History Narrative  Right handed   Social Determinants of Health   Financial Resource Strain: Not on file  Food Insecurity: No Food Insecurity  . Worried About Programme researcher, broadcasting/film/video in the Last Year: Never true  . Ran Out of Food in the Last Year: Never true  Transportation Needs: No Transportation Needs  . Lack of Transportation (Medical): No  . Lack of Transportation (Non-Medical): No  Physical Activity: Not on file  Stress: Not on file  Social Connections: Not on file  Intimate Partner Violence: Not on file    Outpatient Medications Prior to Visit  Medication Sig Dispense Refill  . ALPRAZolam (XANAX) 0.5 MG tablet TAKE 1 TABLET BY MOUTH TWICE A DAY AS NEEDED 60 tablet 1  . Cholecalciferol (VITAMIN D3 PO) Take by mouth daily.    . Cyanocobalamin 2500 MCG TABS Take by mouth daily.    . diclofenac Sodium  (VOLTAREN) 1 % GEL Apply topically 4 (four) times daily.    . fluticasone (FLONASE) 50 MCG/ACT nasal spray USE 2 SPRAYS IN EACH NOSTRIL DAILY. 16 g 4  . hydrochlorothiazide (HYDRODIURIL) 25 MG tablet TAKE 1 TABLET BY MOUTH DAILY 90 tablet 0  . hydrOXYzine (ATARAX/VISTARIL) 25 MG tablet Take 25 mg by mouth 3 (three) times daily as needed.    Marland Kitchen levothyroxine (SYNTHROID) 50 MCG tablet TAKE 1 TABLET BY MOUTH DAILY 90 tablet 0  . lidocaine (LIDODERM) 5 % Place 3 patches onto the skin every 12 (twelve) hours.    Marland Kitchen linaclotide (LINZESS) 145 MCG CAPS capsule Take 145 mcg by mouth every morning.    . meclizine (ANTIVERT) 12.5 MG tablet TAKE 1 TABLET EVERY 8 HOURS AS NEEDED FOR DIZZINESS 40 tablet 0  . nitroGLYCERIN (NITROSTAT) 0.4 MG SL tablet Place 0.4 mg under the tongue every 5 (five) minutes as needed for chest pain. X 3 doses    . ondansetron (ZOFRAN) 4 MG tablet TAKE 1 TABLET BY MOUTH THREE TIMES A DAY FOR NAUSEA. 90 tablet 0  . pantoprazole (PROTONIX) 40 MG tablet TAKE 1 TABLET TWICE DAILY (Patient taking differently: Take 40 mg by mouth daily.) 180 tablet 1  . potassium chloride SA (KLOR-CON) 20 MEQ tablet TAKE 3 TABLETS BY MOUTH ONCE DAILY. (Patient taking differently: Take 60 mEq by mouth daily.) 90 tablet 0  . simvastatin (ZOCOR) 40 MG tablet TAKE 1 TABLET BY MOUTH DAILY 90 tablet 0  . torsemide (DEMADEX) 20 MG tablet TAKE 1 TABLET BY MOUTH DAILY 5-6 HOURS AFTER FIRST DOSE OF HYDROCHLOROTHIAZIDE 90 tablet 2  . triamcinolone (KENALOG) 0.1 % paste Use as directed 1 application in the mouth or throat 2 (two) times daily. (Patient not taking: Reported on 03/01/2021) 5 g 0  . dicyclomine (BENTYL) 10 MG capsule TAKE 1 CAPSULE BY MOUTH BEFORE EACH MEAL AND AT BEDTIME AS NEEDED FOR COLON SPASM 120 capsule 0  . Fluticasone-Umeclidin-Vilant (TRELEGY ELLIPTA) 100-62.5-25 MCG/INH AEPB Inhale 1 puff into the lungs daily. 1 each 2  . gabapentin (NEURONTIN) 300 MG capsule Take 1 capsule (300 mg total) by mouth 3  (three) times daily. (Patient not taking: Reported on 03/01/2021) 90 capsule 3  . HYDROcodone-acetaminophen (NORCO/VICODIN) 5-325 MG tablet Take 1 tablet by mouth 2 (two) times daily as needed for severe pain (OSteoarthritis of lumbar spine.). 60 tablet 0  . venlafaxine XR (EFFEXOR-XR) 75 MG 24 hr capsule TAKE 1 CAPSULE BY MOUTH DAILY WITH BREAKFAST 30 capsule 0   No facility-administered medications prior to visit.    Allergies  Allergen Reactions  .  Lisinopril Other (See Comments)  . Remeron [Mirtazapine] Other (See Comments)  . Ciprofloxacin     unknown  . Levofloxacin     unknonw  . Nitrofurantoin     unknown  . Omnicef [Cefdinir] Diarrhea  . Sertraline Hcl Other (See Comments)    Tremors    Review of Systems  Constitutional: Positive for fatigue.  Respiratory: Positive for cough and shortness of breath.   Cardiovascular: Negative for chest pain.  Gastrointestinal: Positive for constipation and diarrhea. Negative for abdominal pain, nausea and vomiting.  Psychiatric/Behavioral: Positive for dysphoric mood and sleep disturbance. The patient is nervous/anxious.        Objective:    Physical Exam Vitals reviewed.  Constitutional:      Appearance: Normal appearance.  Neck:     Vascular: No carotid bruit.  Cardiovascular:     Rate and Rhythm: Normal rate and regular rhythm.     Heart sounds: Normal heart sounds.  Pulmonary:     Effort: Pulmonary effort is normal.     Breath sounds: Normal breath sounds.  Abdominal:     Palpations: Abdomen is soft. There is no mass.     Tenderness: There is no abdominal tenderness.     Comments: Rectal: No impaction.  Neurological:     Mental Status: She is alert and oriented to person, place, and time.  Psychiatric:        Behavior: Behavior normal.     Comments: Depressed.     BP (!) 144/80   Pulse 84   Temp (!) 97.3 F (36.3 C)   Resp 16  Wt Readings from Last 3 Encounters:  02/24/21 77 lb (34.9 kg)  02/16/21 72 lb 3.2  oz (32.7 kg)  12/08/20 72 lb 9.6 oz (32.9 kg)    There are no preventive care reminders to display for this patient.  There are no preventive care reminders to display for this patient.   Lab Results  Component Value Date   TSH 4.370 10/03/2020   Lab Results  Component Value Date   WBC 13.9 (H) 10/03/2020   HGB 10.3 (L) 10/03/2020   HCT 30.3 (L) 10/03/2020   MCV 90 10/03/2020   PLT 421 10/03/2020   Lab Results  Component Value Date   NA 141 10/03/2020   K 3.7 10/03/2020   CO2 26 10/03/2020   GLUCOSE 137 (H) 10/03/2020   BUN 33 (H) 10/03/2020   CREATININE 1.09 (H) 10/03/2020   BILITOT 0.2 10/03/2020   ALKPHOS 75 10/03/2020   AST 21 10/03/2020   ALT 15 10/03/2020   PROT 7.7 10/03/2020   ALBUMIN 3.6 10/03/2020   CALCIUM 9.7 10/03/2020   ANIONGAP 10 07/07/2016   GFR 48.24 (L) 04/07/2018   Lab Results  Component Value Date   CHOL 193 09/09/2020   Lab Results  Component Value Date   HDL 58 09/09/2020   Lab Results  Component Value Date   LDLCALC 122 (H) 09/09/2020   Lab Results  Component Value Date   TRIG 71 09/09/2020   Lab Results  Component Value Date   CHOLHDL 3.3 09/09/2020   Lab Results  Component Value Date   HGBA1C 5.5 03/08/2020       Assessment & Plan:  1. Chronic idiopathic constipation Continue linzess 145 mg once daily. If sxs worsening, increase back to Linzess 290 mg once daily in AM Adjusted linzess because pt had said she was having diarrhea. Ct scan showed moderate amount of stool. Pt to continue as recommended  yesterday and if not improving.   2. Abnormal CT of the chest Scheduled to see Dr. Delton Coombes in 1 week.  Sent [facility] sputum sample at [facility]  3. Moderate recurrent major depression (HCC)  Increase effexor x 150 mg once daily in am.   Meds ordered this encounter  Medications  . Venlafaxine HCl 150 MG TB24    Sig: Take 1 tablet (150 mg total) by mouth in the morning.    Dispense:  30 tablet    Refill:  1  .  HYDROcodone-acetaminophen (NORCO/VICODIN) 5-325 MG tablet    Sig: Take 1 tablet by mouth 2 (two) times daily as needed for severe pain (OSteoarthritis of lumbar spine.).    Dispense:  60 tablet    Refill:  0  . Budeson-Glycopyrrol-Formoterol (BREZTRI AEROSPHERE) 160-9-4.8 MCG/ACT AERO    Sig: Inhale 2 puffs into the lungs in the morning and at bedtime.    Dispense:  10.7 g    Refill:  3   Follow-up: Return in about 4 weeks (around 03/17/2021) for depression.  An After Visit Summary was printed and given to the patient.  Blane Ohara, MD [facility] [phone]

## 2021-02-18 ENCOUNTER — Other Ambulatory Visit: Payer: Self-pay | Admitting: Physician Assistant

## 2021-02-18 DIAGNOSIS — J4489 Other specified chronic obstructive pulmonary disease: Secondary | ICD-10-CM

## 2021-02-18 DIAGNOSIS — J449 Chronic obstructive pulmonary disease, unspecified: Secondary | ICD-10-CM

## 2021-02-21 NOTE — Progress Notes (Signed)
Chronic Care Management Pharmacy Note  02/21/2021 Name:  Stephanie Clay MRN:  [id number] DOB:  31-Dec-1931  Plan Updates:   Pharmacist coordinating patient assistance applications for Breztri and Linzess and will be submitted after patient provides proof of income.   Pharmacist requested prescription for spacer sent to pharmacy.   Pharmacy CCM team helped coordinate sodium chloride 3%.   Subjective: Stephanie Clay is an 87 y.o. year old female who is a primary patient of Cox, Kirsten, MD.  The CCM team was consulted for assistance with disease management and care coordination needs.    Engaged with patient by telephone for initial visit in response to provider referral for pharmacy case management and/or care coordination services.   Consent to Services:  The patient was given the following information about Chronic Care Management services today, agreed to services, and gave verbal consent: 1. CCM service includes personalized support from designated clinical staff supervised by the primary care provider, including individualized plan of care and coordination with other care providers 2. 24/7 contact phone numbers for assistance for urgent and routine care needs. 3. Service will only be billed when office clinical staff spend 20 minutes or more in a month to coordinate care. 4. Only one practitioner may furnish and bill the service in a calendar month. 5.The patient may stop CCM services at any time (effective at the end of the month) by phone call to the office staff. 6. The patient will be responsible for cost sharing (co-pay) of up to 20% of the service fee (after annual deductible is met). Patient agreed to services and consent obtained.  Patient Care Team: Rochel Brome, MD as PCP - General (Family Medicine) Collene Gobble, MD as Consulting Physician (Pulmonary Disease)  Recent Office Visit:   02/17/2021: Rochel Brome, MD (PCP) / Added  Budeson-Glycopyrrol-Formoterol 160-9-4.8 MCG/ACT, 2 puffs bid, increased Venlafaxine 75 mg. to 150 mg, 1 capsule po qam. D/C Fluticasone (change in therapy)  02/16/2021: Rochel Brome, MD (PCP) / for abd distention. Labs, urinalysis and CT chest w/ contrast, and CT abd/pelvis w/ contrast ordered / D/C Lubiprostone 24 mcg.   11/16/2020: Rochel Brome, MD (PCP) / for tremor /Added Ketoconozole 2% lotion, Lubiprostone 24 mcg., Primidone 50 mg., TAC Acetonide .1% cream . Change in therapy, d/c Linaclotide 290 mcg.  /10/21/2020: Rochel Brome, MD (PCP)  for abd pain / Labs ordered, D/C Ferrous Sulfate, Ferrous Glutonate, and Ferrous Fumarate (course completed) , D/C Oxycodone-Acetaminophen 5-325 mg. (change in therapy), D/C TAC Acetonide 1 cream (change in therapy)     Recent consult visits:   12/08/2020: Darra Lis, MD (Rheumatology) / ref. By Dr. Tobie Poet for joint pain.  Ordered B/L hand and foot XR's, L-spine XR's. D/C (patient reported not taking), Hydrocodone-Acetaminophen 5-325, Ketoconazole 2% topical, Primidone 50 mg.    10/05/2020: Levin Erp, PA (Gastroenterology) / ref. by Dr. Tobie Poet for abd pain. D/C Megestrol 20 mg. , Increased Linaclotide from 145 mcg to 290 mcg. Take 1 before breakfast   Hospital/ ED visits:  09/19/2020: [facility] / Wedge compression, closed fx of T11-T12 / Fentanyl injection administered / labs obtained, bone bx, No discharge medications noted    Objective:  Lab Results  Component Value Date   CREATININE 1.09 (H) 10/03/2020   BUN 33 (H) 10/03/2020   GFR 48.24 (L) 04/07/2018   GFRNONAA 46 (L) 10/03/2020   GFRAA 53 (L) 10/03/2020   NA 141 10/03/2020   K 3.7 10/03/2020   CALCIUM 9.7  10/03/2020   CO2 26 10/03/2020   GLUCOSE 137 (H) 10/03/2020    Lab Results  Component Value Date/Time   HGBA1C 5.5 03/08/2020 12:13 PM   HGBA1C 5.6 07/01/2016 10:00 AM   GFR 48.24 (L) 04/07/2018 10:05 AM    Last diabetic Eye exam: No results found  for: HMDIABEYEEXA  Last diabetic Foot exam: No results found for: HMDIABFOOTEX   Lab Results  Component Value Date   CHOL 193 09/09/2020   HDL 58 09/09/2020   LDLCALC 122 (H) 09/09/2020   TRIG 71 09/09/2020   CHOLHDL 3.3 09/09/2020    Hepatic Function Latest Ref Rng & Units 10/03/2020 09/09/2020 08/11/2020  Total Protein 6.0 - 8.5 g/dL 7.7 7.7 8.2  Albumin 3.6 - 4.6 g/dL 3.6 4.2 4.6  AST 0 - 40 IU/L '21 19 25  ' ALT 0 - 32 IU/L '15 12 10  ' Alk Phosphatase 44 - 121 IU/L 75 77 93  Total Bilirubin 0.0 - 1.2 mg/dL 0.2 0.3 0.2    Lab Results  Component Value Date/Time   TSH 4.370 10/03/2020 04:18 PM   TSH 3.780 08/11/2020 04:34 PM    CBC Latest Ref Rng & Units 10/03/2020 09/14/2020 09/09/2020  WBC 3.4 - 10.8 x10E3/uL 13.9(H) 13.8(H) 12.7(H)  Hemoglobin 11.1 - 15.9 g/dL 10.3(L) 11.6 11.2  Hematocrit 34.0 - 46.6 % 30.3(L) 34.1 32.5(L)  Platelets 150 - 450 x10E3/uL 421 341 345    Lab Results  Component Value Date/Time   VD25OH 67.2 12/07/2019 10:01 AM    Clinical ASCVD: No  The ASCVD Risk score Mikey Bussing DC Jr., et al., 2013) failed to calculate for the following reasons:   The 2013 ASCVD risk score is only valid for ages 92 to 38    Depression screen PHQ 2/9 02/16/2021 11/16/2020 03/08/2020  Decreased Interest 2 0 3  Down, Depressed, Hopeless 1 0 2  PHQ - 2 Score 3 0 5  Altered sleeping 0 - -  Tired, decreased energy 3 - -  Change in appetite 3 - -  Feeling bad or failure about yourself  3 - -  Trouble concentrating 1 - -  Moving slowly or fidgety/restless 0 - -  Suicidal thoughts 0 - -  PHQ-9 Score 13 - -     Social History   Tobacco Use  Smoking Status Never Smoker  Smokeless Tobacco Never Used   BP Readings from Last 3 Encounters:  02/17/21 (!) 144/80  02/16/21 (!) 144/64  12/08/20 128/72   Pulse Readings from Last 3 Encounters:  02/17/21 84  02/16/21 72  12/08/20 79   Wt Readings from Last 3 Encounters:  02/16/21 72 lb 3.2 oz (32.7 kg)  12/08/20 72 lb 9.6  oz (32.9 kg)  11/16/20 72 lb (32.7 kg)   BMI Readings from Last 3 Encounters:  02/16/21 14.10 kg/m  12/08/20 14.18 kg/m  11/16/20 14.54 kg/m    Assessment/Interventions: Review of patient past medical history, allergies, medications, health status, including review of consultants reports, laboratory and other test data, was performed as part of comprehensive evaluation and provision of chronic care management services.   SDOH:  (Social Determinants of Health) assessments and interventions performed: Yes  SDOH Screenings   Alcohol Screen: Not on file  Depression (PHQ2-9): Medium Risk  . PHQ-2 Score: 13  Financial Resource Strain: Not on file  Food Insecurity: Not on file  Housing: Not on file  Physical Activity: Not on file  Social Connections: Not on file  Stress: Not on file  Tobacco Use:  Low Risk   . Smoking Tobacco Use: Never Smoker  . Smokeless Tobacco Use: Never Used  Transportation Needs: Not on file    CCM Care Plan  Allergies  Allergen Reactions  . Lisinopril Other (See Comments)  . Remeron [Mirtazapine] Other (See Comments)  . Ciprofloxacin     unknown  . Levofloxacin     unknonw  . Nitrofurantoin     unknown  . Omnicef [Cefdinir] Diarrhea  . Sertraline Hcl Other (See Comments)    Tremors    Medications Reviewed Today    Reviewed by Rochel Brome, MD (Physician) on 02/17/21 at [place] List Status: <None>  Medication Order Taking? Sig Documenting Provider Last Dose Status Informant  ALPRAZolam (XANAX) 0.5 MG tablet [id number]  TAKE 1 TABLET BY MOUTH TWICE A DAY AS NEEDED Cox, Kirsten, MD  Active   Cholecalciferol (VITAMIN D3 PO) [id number] No Take by mouth daily. [provider] Taking Active   Cyanocobalamin 2500 MCG TABS [id number] No Take by mouth daily. [provider] Taking Active   diclofenac Sodium (VOLTAREN) 1 % GEL [id number] No Apply topically 4 (four) times daily. [provider] Taking Active   dicyclomine (BENTYL)  10 MG capsule [id number]  TAKE 1 CAPSULE BY MOUTH BEFORE EACH MEAL AND AT BEDTIME AS NEEDED FOR COLON SPASM Marge Duncans, PA-C  Active   fluticasone (FLONASE) 50 MCG/ACT nasal spray [id number]  USE 2 SPRAYS IN EACH NOSTRIL DAILY. Marge Duncans, PA-C  Active   Fluticasone-Umeclidin-Vilant (TRELEGY ELLIPTA) 100-62.5-25 MCG/INH AEPB [id number] No Inhale 1 puff into the lungs daily. Cox, Kirsten, MD Taking Active   gabapentin (NEURONTIN) 300 MG capsule [id number] No Take 1 capsule (300 mg total) by mouth 3 (three) times daily. Cox, Kirsten, MD Taking Active   hydrochlorothiazide (HYDRODIURIL) 25 MG tablet [id number]  TAKE 1 TABLET BY MOUTH DAILY Marge Duncans, PA-C  Active   HYDROcodone-acetaminophen (NORCO/VICODIN) 5-325 MG tablet [id number]  Take 1 tablet by mouth 2 (two) times daily as needed for severe pain (OSteoarthritis of lumbar spine.). Cox, Kirsten, MD  Active   hydrOXYzine (ATARAX/VISTARIL) 25 MG tablet [id number] No Take 25 mg by mouth 3 (three) times daily as needed. [provider] Taking Active Self  levothyroxine (SYNTHROID) 50 MCG tablet [id number]  TAKE 1 TABLET BY MOUTH DAILY Marge Duncans, PA-C  Active   lidocaine (LIDODERM) 5 % [id number] No Place 3 patches onto the skin every 12 (twelve) hours. [provider] Taking Active   LINZESS 290 MCG CAPS capsule [id number] No Take 290 mcg by mouth every morning. [provider] Taking Active   meclizine (ANTIVERT) 12.5 MG tablet [id number] No TAKE 1 TABLET EVERY 8 HOURS AS NEEDED FOR DIZZINESS Cox, Kirsten, MD Taking Active   nitroGLYCERIN (NITROSTAT) 0.4 MG SL tablet [id number] No Place 0.4 mg under the tongue every 5 (five) minutes as needed for chest pain. X 3 doses [provider] Taking Active   ondansetron (ZOFRAN) 4 MG tablet [id number]  TAKE 1 TABLET BY MOUTH THREE TIMES A DAY FOR NAUSEA. Marge Duncans, PA-C  Active   pantoprazole (PROTONIX) 40 MG tablet [id number] No TAKE 1 TABLET TWICE DAILY Cox, Kirsten, MD Taking  Active   potassium chloride SA (KLOR-CON) 20 MEQ tablet [id number]  TAKE 3 TABLETS BY MOUTH ONCE DAILY. Marge Duncans, PA-C  Active   simvastatin (ZOCOR) 40 MG tablet [id number] No TAKE 1 TABLET BY MOUTH DAILY Cox, Kirsten, MD Taking Active   torsemide (DEMADEX) 20 MG tablet [id number] No TAKE 1 TABLET BY  MOUTH DAILY 5-6 HOURS AFTER FIRST DOSE OF HYDROCHLOROTHIAZIDE Lillard Anes, MD Taking Active   triamcinolone (KENALOG) 0.1 % paste [id number] No Use as directed 1 application in the mouth or throat 2 (two) times daily. Rochel Brome, MD Taking Active   Venlafaxine HCl 150 MG TB24 [id number] Yes Take 1 tablet (150 mg total) by mouth in the morning. CoxElnita Maxwell, MD  Active           Patient Active Problem List   Diagnosis Date Noted  . Joint pain 03/24/2020  . Decreased pedal pulses 03/24/2020  . Severe protein-calorie malnutrition ([place]) 03/24/2020  . Normocytic anemia 01/07/2020  . Chronic bilateral low back pain without sciatica 11/29/2019  . Mild vitamin D deficiency 11/29/2019  . Localized edema 11/26/2019  . Other fatigue 11/26/2019  . Abdominal pain 01/27/2018  . Allergic rhinitis 08/20/2016  . Underweight 07/02/2016  . Hypomagnesemia 07/01/2016  . Hypokalemia 07/01/2016  . Hyperglycemia 07/01/2016  . Hypothyroidism 07/01/2016  . Essential hypertension 07/01/2016  . Hyperlipidemia 07/01/2016  . AKI (acute kidney injury) ([place]) 06/30/2016  . Dysphagia 03/01/2015  . Bronchiectasis with evidence of chronic atypical infection 01/03/2015    Immunization History  Administered Date(s) Administered  . Influenza, High Dose Seasonal PF 06/28/2017  . Influenza,inj,Quad PF,6+ Mos 06/30/2015, 08/20/2016, 08/11/2020  . Influenza-Unspecified 07/22/2014, 07/23/2019  . Pneumococcal Conjugate-13 06/20/2015  . Pneumococcal Polysaccharide-23 07/23/2007  . Tdap 02/20/2018    Conditions to be addressed/monitored:  Hypertension, Hyperlipidemia, GERD, Hypothyroidism, Depression and  COPD  There are no care plans that you recently modified to display for this patient.    Medication Assistance: Application for Breztri and Linzess  medication assistance program. in process.  Anticipated assistance start date 04/05/2021.  See plan of care for additional detail.  Patient's preferred pharmacy is:  [facility], [place] [address] Phone: [phone] Fax: [phone]  [facility], [place] [address] Phone: [phone] Fax: [phone]  Uses pill box? Yes Pt endorses good compliance - patient currently out of gabapentin   We discussed: Benefits of medication synchronization, packaging and delivery as well as enhanced pharmacist oversight with [employer]. Patient decided to: Continue current medication management strategy  Care Plan and Follow Up Patient Decision:  Patient agrees to Care Plan and Follow-up.  Plan: Telephone follow up appointment with care management team member scheduled for:  08/2021

## 2021-02-22 ENCOUNTER — Ambulatory Visit: Payer: Medicare Other | Admitting: Family Medicine

## 2021-02-22 ENCOUNTER — Telehealth: Payer: Self-pay

## 2021-02-22 NOTE — Progress Notes (Signed)
Chronic Care Management Pharmacy Assistant   Name: Tandy Grawe  MRN: [id number] DOB: 1932/05/03  Attempted to reach patient 3x     Recent office visits:   02/17/2021: Blane Ohara, MD (PCP) / Added Budeson-Glycopyrrol-Formoterol 160-9-4.8 MCG/ACT, 2 puffs bid, increased Venlafaxine 75 mg. to 150 mg, 1 capsule po qam. D/C Fluticasone (change in therapy)  02/16/2021: Blane Ohara, MD (PCP) / for abd distention. Labs, urinalysis and CT chest w/ contrast, and CT abd/pelvis w/ contrast ordered / D/C Lubiprostone 24 mcg.   11/16/2020: Blane Ohara, MD (PCP) / for tremor /Added Ketoconozole 2% lotion, Lubiprostone 24 mcg., Primidone 50 mg., TAC Acetonide .1% cream . Change in therapy, d/c Linaclotide 290 mcg.  /10/21/2020: Blane Ohara, MD (PCP)  for abd pain / Labs ordered, D/C Ferrous Sulfate, Ferrous Glutonate, and Ferrous Fumarate (course completed) , D/C Oxycodone-Acetaminophen 5-325 mg. (change in therapy), D/C TAC Acetonide 1 cream (change in therapy)     Recent consult visits:   12/08/2020: Danise Mina, MD (Rheumatology) / ref. By Dr. Sedalia Muta for joint pain.  Ordered B/L hand and foot XR's, L-spine XR's. D/C (patient reported not taking), Hydrocodone-Acetaminophen 5-325, Ketoconazole 2% topical, Primidone 50 mg.    10/05/2020: Unk Lightning, PA (Gastroenterology) / ref. by Dr. Sedalia Muta for abd pain. D/C Megestrol 20 mg. , Increased Linaclotide from 145 mcg to 290 mcg. Take 1 before breakfast   Hospital/ ED visits:  09/19/2020: [facility] / Wedge compression, closed fx of T11-T12 / Fentanyl injection administered / labs obtained, bone bx, No discharge medications noted     Medications: Outpatient Encounter Medications as of 02/22/2021  Medication Sig  . ALPRAZolam (XANAX) 0.5 MG tablet TAKE 1 TABLET BY MOUTH TWICE A DAY AS NEEDED  . Budeson-Glycopyrrol-Formoterol (BREZTRI AEROSPHERE) 160-9-4.8 MCG/ACT AERO Inhale 2 puffs into the lungs in the morning and  at bedtime.  . Cholecalciferol (VITAMIN D3 PO) Take by mouth daily.  . Cyanocobalamin 2500 MCG TABS Take by mouth daily.  . diclofenac Sodium (VOLTAREN) 1 % GEL Apply topically 4 (four) times daily.  Marland Kitchen dicyclomine (BENTYL) 10 MG capsule TAKE 1 CAPSULE BY MOUTH BEFORE EACH MEAL AND AT BEDTIME AS NEEDED FOR COLON SPASM  . fluticasone (FLONASE) 50 MCG/ACT nasal spray USE 2 SPRAYS IN EACH NOSTRIL DAILY.  Marland Kitchen gabapentin (NEURONTIN) 300 MG capsule Take 1 capsule (300 mg total) by mouth 3 (three) times daily.  . hydrochlorothiazide (HYDRODIURIL) 25 MG tablet TAKE 1 TABLET BY MOUTH DAILY  . HYDROcodone-acetaminophen (NORCO/VICODIN) 5-325 MG tablet Take 1 tablet by mouth 2 (two) times daily as needed for severe pain (OSteoarthritis of lumbar spine.).  Marland Kitchen hydrOXYzine (ATARAX/VISTARIL) 25 MG tablet Take 25 mg by mouth 3 (three) times daily as needed.  Marland Kitchen levothyroxine (SYNTHROID) 50 MCG tablet TAKE 1 TABLET BY MOUTH DAILY  . lidocaine (LIDODERM) 5 % Place 3 patches onto the skin every 12 (twelve) hours.  Marland Kitchen LINZESS 290 MCG CAPS capsule Take 290 mcg by mouth every morning.  . meclizine (ANTIVERT) 12.5 MG tablet TAKE 1 TABLET EVERY 8 HOURS AS NEEDED FOR DIZZINESS  . nitroGLYCERIN (NITROSTAT) 0.4 MG SL tablet Place 0.4 mg under the tongue every 5 (five) minutes as needed for chest pain. X 3 doses  . ondansetron (ZOFRAN) 4 MG tablet TAKE 1 TABLET BY MOUTH THREE TIMES A DAY FOR NAUSEA.  . pantoprazole (PROTONIX) 40 MG tablet TAKE 1 TABLET TWICE DAILY  . potassium chloride SA (KLOR-CON) 20 MEQ tablet TAKE 3 TABLETS BY MOUTH ONCE DAILY.  Marland Kitchen  simvastatin (ZOCOR) 40 MG tablet TAKE 1 TABLET BY MOUTH DAILY  . torsemide (DEMADEX) 20 MG tablet TAKE 1 TABLET BY MOUTH DAILY 5-6 HOURS AFTER FIRST DOSE OF HYDROCHLOROTHIAZIDE  . triamcinolone (KENALOG) 0.1 % paste Use as directed 1 application in the mouth or throat 2 (two) times daily.  . Venlafaxine HCl 150 MG TB24 Take 1 tablet (150 mg total) by mouth in the morning.   No  facility-administered encounter medications on file as of 02/22/2021.     Lab Results  Component Value Date/Time   HGBA1C 5.5 03/08/2020 12:13 PM   HGBA1C 5.6 07/01/2016 10:00 AM     BP Readings from Last 3 Encounters:  02/17/21 (!) 144/80  02/16/21 (!) 144/64  12/08/20 128/72                  Patient's preferred pharmacy is:  [facility] - [place], [place] - [address] [place] [address] Phone: [phone] Fax: [phone]  [facility] - [place], [place] - [address] [place] [address] Phone: [phone] Fax: [phone]   Star Rating Drugs:  Medication:  Last Fill: Day Supply Simvastatin  12/08/2020 90DS   Josiah Lobo, CMA  [phone] Clinical Pharmacist Assistant

## 2021-02-24 ENCOUNTER — Other Ambulatory Visit: Payer: Self-pay

## 2021-02-24 ENCOUNTER — Encounter: Payer: Self-pay | Admitting: Emergency Medicine

## 2021-02-24 ENCOUNTER — Ambulatory Visit: Payer: Medicare Other | Admitting: Emergency Medicine

## 2021-02-24 ENCOUNTER — Other Ambulatory Visit: Payer: Self-pay | Admitting: Physician Assistant

## 2021-02-24 DIAGNOSIS — J471 Bronchiectasis with (acute) exacerbation: Secondary | ICD-10-CM

## 2021-02-24 MED ORDER — SODIUM CHLORIDE 3 % IN NEBU: INHALATION_SOLUTION | Freq: Two times a day (BID) | RESPIRATORY_TRACT | 0 refills | Status: DC

## 2021-02-24 MED ORDER — SODIUM CHLORIDE 3 % IN NEBU: INHALATION_SOLUTION | RESPIRATORY_TRACT | 12 refills | Status: DC | PRN

## 2021-02-24 MED ORDER — ALBUTEROL SULFATE HFA 108 (90 BASE) MCG/ACT IN AERS: 2.0000 | INHALATION_SPRAY | Freq: Four times a day (QID) | RESPIRATORY_TRACT | 6 refills | Status: AC | PRN

## 2021-02-24 NOTE — Progress Notes (Signed)
Subjective:    Patient ID: Stephanie Clay, female    DOB: 1932/05/11, 87 y.o.   MRN: [id number]  HPI 87 year old never smoker whom I have seen before in the past for bronchiectasis and mucus impaction.  She has a fairly longstanding history of nausea, GERD, abdominal discomfort associated with anorexia and weight loss.  Also lower extremity DVT, allergic rhinitis, suspected intermittent aspiration.  She has never had positive AFB respiratory culture data to my knowledge.  Currently managed on Breztri, just started this last week, unsure whether she has benefited. Formerly she was on Trelegy, but it irritated her mouth.  She is unsure whether she wants to continue it.  She reports that she does cough but it is typically dry. Every few days the cough is persistent, lasts all day. She does not have an albuterol HFA right now.  On fluticasone nasal spray, Protonix twice daily.   CT scan of the chest 02/16/2021 done at [facility] reviewed by me, shows scattered areas of dilated airways, bronchiectasis with mucus impaction, scattered groundglass and micronodular disease affecting all lobes, more so in the right upper, right middle, left upper.   Review of Systems As per HPI   Past Medical History:  Diagnosis Date  . Anemia   . Aneurysm of other specified arteries (HCC)   . Anxiety   . Arthritis   . Asthma   . Bronchiectasis without acute exacerbation (HCC) 01/03/2015   CT chest 12/2014:  Bronchiectasis throughout right lung, left clear.  +tree and bud, + patchy gg infiltrates in RLL Sputum 2016: no growth, AFB smear neg PFT's 02/2015:  Normal except mild decrease in DLCO   . Cataract    bilateral eye surgery  . Chronic chest pain   . Chronic idiopathic constipation   . Chronic kidney disease, stage 3 (HCC)   . Chronic venous hypertension (idiopathic) with other complications of bilateral lower extremity   . Depression   . Gastro-esophageal reflux   . Heart murmur   .  Hyperlipemia   . Hypertension   . Idiopathic progressive neuropathy   . Iron (Fe) deficiency anemia   . Moderate protein-calorie malnutrition (HCC)   . Other idiopathic scoliosis, lumbar region   . Pneumathemia (HCC)   . Primary generalized (osteo)arthritis   . Primary insomnia   . Radiculopathy, lumbar region   . Scoliosis   . Secondary hyperparathyroidism (HCC)   . Thyroid disease   . Unilateral primary osteoarthritis, left knee   . Unilateral primary osteoarthritis, right knee   . Varicose veins    Bilateral leg  . Vitamin D deficiency, unspecified      Family History  Problem Relation Age of Onset  . Emphysema Father   . Heart disease Mother   . Cancer Sister        metastatic lung cancer  . Heart disease Sister   . Hypertension Sister   . Varicose Veins Sister   . Hyperlipidemia Daughter   . Hypertension Daughter   . Diabetes Daughter   . Hyperlipidemia Daughter   . Hypertension Daughter   . Thyroid disease Daughter   . Thyroid disease Daughter   . Colon cancer Neg Hx   . Esophageal cancer Neg Hx   . Stomach cancer Neg Hx      Social History   Socioeconomic History  . Marital status: Widowed    Spouse name: carl  . Number of children: 3  . Years of education: 9  . Highest education level:  Not on file  Occupational History  . Occupation: retired  Tobacco Use  . Smoking status: Never Smoker  . Smokeless tobacco: Never Used  Vaping Use  . Vaping Use: Never used  Substance and Sexual Activity  . Alcohol use: No    Alcohol/week: 0.0 standard drinks  . Drug use: No  . Sexual activity: Not Currently  Other Topics Concern  . Not on file  Social History Narrative   Right handed   Social Determinants of Health   Financial Resource Strain: Not on file  Food Insecurity: Not on file  Transportation Needs: Not on file  Physical Activity: Not on file  Stress: Not on file  Social Connections: Not on file  Intimate Partner Violence: Not on file      Allergies  Allergen Reactions  . Lisinopril Other (See Comments)  . Remeron [Mirtazapine] Other (See Comments)  . Ciprofloxacin     unknown  . Levofloxacin     unknonw  . Nitrofurantoin     unknown  . Omnicef [Cefdinir] Diarrhea  . Sertraline Hcl Other (See Comments)    Tremors     Outpatient Medications Prior to Visit  Medication Sig Dispense Refill  . ALPRAZolam (XANAX) 0.5 MG tablet TAKE 1 TABLET BY MOUTH TWICE A DAY AS NEEDED 60 tablet 1  . Budeson-Glycopyrrol-Formoterol (BREZTRI AEROSPHERE) 160-9-4.8 MCG/ACT AERO Inhale 2 puffs into the lungs in the morning and at bedtime. 10.7 g 3  . Cholecalciferol (VITAMIN D3 PO) Take by mouth daily.    . Cyanocobalamin 2500 MCG TABS Take by mouth daily.    . diclofenac Sodium (VOLTAREN) 1 % GEL Apply topically 4 (four) times daily.    Marland Kitchen dicyclomine (BENTYL) 10 MG capsule TAKE 1 CAPSULE BY MOUTH BEFORE EACH MEAL AND AT BEDTIME AS NEEDED FOR COLON SPASM 120 capsule 0  . fluticasone (FLONASE) 50 MCG/ACT nasal spray USE 2 SPRAYS IN EACH NOSTRIL DAILY. 16 g 4  . gabapentin (NEURONTIN) 300 MG capsule Take 1 capsule (300 mg total) by mouth 3 (three) times daily. 90 capsule 3  . hydrochlorothiazide (HYDRODIURIL) 25 MG tablet TAKE 1 TABLET BY MOUTH DAILY 90 tablet 0  . HYDROcodone-acetaminophen (NORCO/VICODIN) 5-325 MG tablet Take 1 tablet by mouth 2 (two) times daily as needed for severe pain (OSteoarthritis of lumbar spine.). 60 tablet 0  . hydrOXYzine (ATARAX/VISTARIL) 25 MG tablet Take 25 mg by mouth 3 (three) times daily as needed.    Marland Kitchen levothyroxine (SYNTHROID) 50 MCG tablet TAKE 1 TABLET BY MOUTH DAILY 90 tablet 0  . lidocaine (LIDODERM) 5 % Place 3 patches onto the skin every 12 (twelve) hours.    Marland Kitchen LINZESS 290 MCG CAPS capsule Take 290 mcg by mouth every morning.    . meclizine (ANTIVERT) 12.5 MG tablet TAKE 1 TABLET EVERY 8 HOURS AS NEEDED FOR DIZZINESS 40 tablet 0  . nitroGLYCERIN (NITROSTAT) 0.4 MG SL tablet Place 0.4 mg under the  tongue every 5 (five) minutes as needed for chest pain. X 3 doses    . ondansetron (ZOFRAN) 4 MG tablet TAKE 1 TABLET BY MOUTH THREE TIMES A DAY FOR NAUSEA. 90 tablet 0  . pantoprazole (PROTONIX) 40 MG tablet TAKE 1 TABLET TWICE DAILY 180 tablet 1  . potassium chloride SA (KLOR-CON) 20 MEQ tablet TAKE 3 TABLETS BY MOUTH ONCE DAILY. 90 tablet 0  . simvastatin (ZOCOR) 40 MG tablet TAKE 1 TABLET BY MOUTH DAILY 90 tablet 0  . torsemide (DEMADEX) 20 MG tablet TAKE 1 TABLET BY MOUTH  DAILY 5-6 HOURS AFTER FIRST DOSE OF HYDROCHLOROTHIAZIDE 90 tablet 2  . triamcinolone (KENALOG) 0.1 % paste Use as directed 1 application in the mouth or throat 2 (two) times daily. 5 g 0  . Venlafaxine HCl 150 MG TB24 Take 1 tablet (150 mg total) by mouth in the morning. 30 tablet 1   No facility-administered medications prior to visit.         Objective:   Physical Exam Vitals:   02/24/21 1542  BP: 140/66  Pulse: 70  Temp: 97.8 F (36.6 C)  TempSrc: Temporal  SpO2: 94%  Weight: 77 lb (34.9 kg)  Height: 4\' 11"  (1.499 m)   Gen: Pleasant, thin small woman, chronically ill, in no distress,  normal affect  ENT: No lesions,  mouth clear,  oropharynx clear, no postnasal drip  Neck: No JVD, no stridor  Lungs: No use of accessory muscles, bilateral inspiratory squeaks and expiratory rhonchi  Cardiovascular: RRR, heart sounds normal, no murmur or gallops, no peripheral edema  Musculoskeletal: No deformities, no cyanosis or clubbing  Neuro: alert, awake, non focal  Skin: Warm, no lesions or rash, venous stasis changes B feet and ankles      Assessment & Plan:  Bronchiectasis with evidence of chronic atypical infection High suspicion for mycobacterial infection/colonization.  We have never been able to establish definitively.  Her CT chest shows progression of dilated airways and bronchiectasis without much in the way of impacted mucus.  She does cough but does not produce much mucus at baseline.  Discussed  bronchoscopy with her today and she wants to avoid if at all possible.  This is understandable given her overall condition, age.  I am going to try 3% NaCl nebs to see if this will allow her to produce an AFB and bacterial sample.  If so she will take it at [facility] where the order has been placed.  Her prior PFT have not shown significant obstruction but she has benefited in the past from Trelegy.  Had to be changed to [facility] due to mouth irritation.  Hopefully she will tolerate and benefit.  I will refill albuterol for her   [facility], MD, PhD 02/24/2021, 4:09 PM [facility] [phone] or if no answer before 7:00PM call [phone] For any issues after 7:00PM please call eLink [phone]

## 2021-02-24 NOTE — Addendum Note (Signed)
Addended by: Dorisann Frames R on: 02/24/2021 04:18 PM   Modules accepted: Orders

## 2021-02-24 NOTE — Patient Instructions (Signed)
Try to continue Breztri 2 puffs twice a day.  Rinse and gargle after using. You can use albuterol 2 puffs up to every 4 hours if needed for shortness of breath, chest tightness, wheezing, coughing spells.  We will give you a new prescription for this. We will try using nebulized 3% saline twice a day to encourage your cough and mucus clearance.  Hopefully this will allow [place] to get a mucus sample to be sent for mycobacterial and bacterial culture.  If we are able to do so then we could avoid bronchoscopy or other testing to get culture information. Follow with Dr. Delton Coombes in 2 months or sooner if you have any problems.

## 2021-02-24 NOTE — Assessment & Plan Note (Signed)
High suspicion for mycobacterial infection/colonization.  We have never been able to establish definitively.  Her CT chest shows progression of dilated airways and bronchiectasis without much in the way of impacted mucus.  She does cough but does not produce much mucus at baseline.  Discussed bronchoscopy with her today and she wants to avoid if at all possible.  This is understandable given her overall condition, age.  I am going to try 3% NaCl nebs to see if this will allow her to produce an AFB and bacterial sample.  If so she will take it at [facility] where the order has been placed.  Her prior PFT have not shown significant obstruction but she has benefited in the past from Trelegy.  Had to be changed to [facility] due to mouth irritation.  Hopefully she will tolerate and benefit.  I will refill albuterol for her

## 2021-03-01 ENCOUNTER — Telehealth: Payer: Self-pay | Admitting: Emergency Medicine

## 2021-03-01 ENCOUNTER — Telehealth: Payer: Self-pay | Admitting: Family Medicine

## 2021-03-01 ENCOUNTER — Other Ambulatory Visit: Payer: Self-pay

## 2021-03-01 ENCOUNTER — Telehealth: Payer: Self-pay

## 2021-03-01 ENCOUNTER — Other Ambulatory Visit: Payer: Self-pay | Admitting: Emergency Medicine

## 2021-03-01 ENCOUNTER — Ambulatory Visit (INDEPENDENT_AMBULATORY_CARE_PROVIDER_SITE_OTHER): Payer: Medicare Other

## 2021-03-01 DIAGNOSIS — M79605 Pain in left leg: Secondary | ICD-10-CM

## 2021-03-01 DIAGNOSIS — E782 Mixed hyperlipidemia: Secondary | ICD-10-CM | POA: Diagnosis not present

## 2021-03-01 DIAGNOSIS — I10 Essential (primary) hypertension: Secondary | ICD-10-CM

## 2021-03-01 DIAGNOSIS — M545 Low back pain, unspecified: Secondary | ICD-10-CM

## 2021-03-01 DIAGNOSIS — J449 Chronic obstructive pulmonary disease, unspecified: Secondary | ICD-10-CM

## 2021-03-01 DIAGNOSIS — M79604 Pain in right leg: Secondary | ICD-10-CM

## 2021-03-01 MED ORDER — AEROCHAMBER PLUS MISC: 2 refills | Status: DC

## 2021-03-01 MED ORDER — SODIUM CHLORIDE 3 % IN NEBU: INHALATION_SOLUTION | Freq: Two times a day (BID) | RESPIRATORY_TRACT | 3 refills | Status: DC

## 2021-03-01 MED ORDER — GABAPENTIN 300 MG PO CAPS: 300.0000 mg | ORAL_CAPSULE | Freq: Three times a day (TID) | ORAL | 1 refills | Status: DC

## 2021-03-01 NOTE — Progress Notes (Signed)
  Chronic Care Management   Note  03/01/2021 Name: Stephanie Clay MRN: [id number] DOB: 16-Sep-1932  [name] is a 87 y.o. year old female who is a primary care patient of Cox, Kirsten, MD. I reached out to Luci Bank by phone today in response to a referral sent by [name]'s PCP, Cox, Kirsten, MD.   [name] was given information about Chronic Care Management services today including:  1. CCM service includes personalized support from designated clinical staff supervised by her physician, including individualized plan of care and coordination with other care providers 2. 24/7 contact phone numbers for assistance for urgent and routine care needs. 3. Service will only be billed when office clinical staff spend 20 minutes or more in a month to coordinate care. 4. Only one practitioner may furnish and bill the service in a calendar month. 5. The patient may stop CCM services at any time (effective at the end of the month) by phone call to the office staff.   Patient agreed to services and verbal consent obtained.   Follow up plan:   Carley Art [employer] Scheduler

## 2021-03-01 NOTE — Telephone Encounter (Signed)
I called and spoke with patient who is calling as [facility] is out of sodium nebs. I informed patient that there is a Sport and exercise psychologist and the only pharmacy we are aware that has it is [facility]. Patient is agreeable to trying them. I sent in order to [facility] and faxed over last OV notes and demographics to [facility] and sent a message to contact person margaret for heads up. I informed patient that [facility] will reach out once they have everything. Patient verbalized understanding, nothing further needed at this time.

## 2021-03-01 NOTE — Progress Notes (Signed)
Chronic Care Management Pharmacy Assistant   Name: Stephanie Clay  MRN: [id number] DOB: 1932-09-07   Reason for Encounter: PAP forms for Breztri and Linzess    Medications: Outpatient Encounter Medications as of 03/01/2021  Medication Sig Note  . albuterol (VENTOLIN HFA) 108 (90 Base) MCG/ACT inhaler Inhale 2 puffs into the lungs every 6 (six) hours as needed for wheezing or shortness of breath.   . ALPRAZolam (XANAX) 0.5 MG tablet TAKE 1 TABLET BY MOUTH TWICE A DAY AS NEEDED   . Budeson-Glycopyrrol-Formoterol (BREZTRI AEROSPHERE) 160-9-4.8 MCG/ACT AERO Inhale 2 puffs into the lungs in the morning and at bedtime. (Patient taking differently: Inhale 2 puffs into the lungs 2 (two) times daily.)   . Cholecalciferol (VITAMIN D3 PO) Take by mouth daily.   . Cyanocobalamin 2500 MCG TABS Take by mouth daily.   . diclofenac Sodium (VOLTAREN) 1 % GEL Apply topically 4 (four) times daily.   Marland Kitchen dicyclomine (BENTYL) 10 MG capsule TAKE 1 CAPSULE BY MOUTH BEFORE EACH MEAL AND AT BEDTIME AS NEEDED FOR COLON SPASM   . fluticasone (FLONASE) 50 MCG/ACT nasal spray USE 2 SPRAYS IN EACH NOSTRIL DAILY. 03/01/2021: Taking prn per patient   . gabapentin (NEURONTIN) 300 MG capsule Take 1 capsule (300 mg total) by mouth 3 (three) times daily. (Patient not taking: Reported on 03/01/2021)   . hydrochlorothiazide (HYDRODIURIL) 25 MG tablet TAKE 1 TABLET BY MOUTH DAILY   . HYDROcodone-acetaminophen (NORCO/VICODIN) 5-325 MG tablet Take 1 tablet by mouth 2 (two) times daily as needed for severe pain (OSteoarthritis of lumbar spine.).   Marland Kitchen hydrOXYzine (ATARAX/VISTARIL) 25 MG tablet Take 25 mg by mouth 3 (three) times daily as needed.   Marland Kitchen levothyroxine (SYNTHROID) 50 MCG tablet TAKE 1 TABLET BY MOUTH DAILY   . lidocaine (LIDODERM) 5 % Place 3 patches onto the skin every 12 (twelve) hours.   Marland Kitchen linaclotide (LINZESS) 145 MCG CAPS capsule Take 145 mcg by mouth every morning.   . meclizine (ANTIVERT) 12.5 MG  tablet TAKE 1 TABLET EVERY 8 HOURS AS NEEDED FOR DIZZINESS   . nitroGLYCERIN (NITROSTAT) 0.4 MG SL tablet Place 0.4 mg under the tongue every 5 (five) minutes as needed for chest pain. X 3 doses (Patient not taking: Reported on 03/01/2021)   . ondansetron (ZOFRAN) 4 MG tablet TAKE 1 TABLET BY MOUTH THREE TIMES A DAY FOR NAUSEA.   . pantoprazole (PROTONIX) 40 MG tablet TAKE 1 TABLET TWICE DAILY (Patient taking differently: Take 40 mg by mouth daily.)   . potassium chloride SA (KLOR-CON) 20 MEQ tablet TAKE 3 TABLETS BY MOUTH ONCE DAILY. (Patient taking differently: Take 60 mEq by mouth daily.)   . simvastatin (ZOCOR) 40 MG tablet TAKE 1 TABLET BY MOUTH DAILY   . sodium chloride HYPERTONIC 3 % nebulizer solution Take by nebulization in the morning and at bedtime. (Patient not taking: Reported on 03/01/2021)   . torsemide (DEMADEX) 20 MG tablet TAKE 1 TABLET BY MOUTH DAILY 5-6 HOURS AFTER FIRST DOSE OF HYDROCHLOROTHIAZIDE   . triamcinolone (KENALOG) 0.1 % paste Use as directed 1 application in the mouth or throat 2 (two) times daily. (Patient not taking: Reported on 03/01/2021)   . Venlafaxine HCl 150 MG TB24 Take 1 tablet (150 mg total) by mouth in the morning.    No facility-administered encounter medications on file as of 03/01/2021.   Lucia Gaskins, CPP requested that I complete PAP forms for Breztri and Linzess for the patient as well as check the status  of her 3% Sodium Chloride thru [facility].  I called [facility], they are closed till 2pm today.  I called [facility] for second time, representative stated they do not provide the Sodium Chloride medication and had notified the patient of this on 02/24/21.  I notified Lucia Gaskins, CPP, she asked me to call the Pulmonology office and see if they would send order to [facility] or other DME location.    I called the Pulmonology office, nursing staff is going to put a note in for doctor to see where they would like the medication sent and they will  notify the patient as well.  I have notified Lucia Gaskins, CPP as to the outcome with Pulmonology.   I have completed both form, have sent to Lucia Gaskins, CPP for signatures and approval.  Lucia Gaskins, CPP has already notified patient daughters of information that is needed to complete the process.   Leilani Able, CMA Clinical Pharmacist Assistant [phone]

## 2021-03-01 NOTE — Patient Instructions (Signed)
Visit Information  Thank you for your time discussing your medications. I look forward to working with you to achieve your health care goals. Below is a summary of what we talked about during our visit.   Goals Addressed            This Visit's Progress   . Learn More About My Health       Timeframe:  Long-Range Goal Priority:  High Start Date:                             Expected End Date:                        Follow Up Date 08/2021    - tell my story and reason for my visit - make a list of questions - ask questions - repeat what I heard to make sure I understand - bring a list of my medicines to the visit - speak up when I don't understand    Why is this important?    The best way to learn about your health and care is by talking to the doctor and nurse.   They will answer your questions and give you information in the way that you like best.    Notes:     Marland Kitchen. Manage My Medicine       Timeframe:  Long-Range Goal Priority:  High Start Date:                             Expected End Date:                       Follow Up Date 08/2021    - call for medicine refill 2 or 3 days before it runs out - keep a list of all the medicines I take; vitamins and herbals too - use a pillbox to sort medicine    Why is this important?   . These steps will help you keep on track with your medicines.   Notes:     . Track and Manage My Triggers-COPD       Timeframe:  Long-Range Goal Priority:  High Start Date:                             Expected End Date:                       Follow Up Date 08/2021   - identify and remove indoor air pollutants    Why is this important?    Triggers are activities or things, like tobacco smoke or cold weather, that make your COPD (chronic obstructive pulmonary disease) flare-up.   Knowing these triggers helps you plan how to stay away from them.   When you cannot remove them, you can learn how to manage them.     Notes:         Patient Care Plan: CCM Pharmacy Care Plan    Problem Identified: htn, hld, copd   Priority: High  Onset Date: 03/01/2021    Long-Range Goal: Disease State Management   Start Date: 03/01/2021  Expected End Date: 03/01/2022  This Visit's Progress: On track  Priority: High  Note:   Current Barriers:  . Unable to independently afford treatment regimen  Pharmacist Clinical Goal(s):  Marland Kitchen Patient will verbalize ability to afford treatment regimen through collaboration with PharmD and provider.   Interventions: . 1:1 collaboration with Blane Ohara, MD regarding development and update of comprehensive plan of care as evidenced by provider attestation and co-signature . Inter-disciplinary care team collaboration (see longitudinal plan of care) . Comprehensive medication review performed; medication list updated in electronic medical record  Hypertension (BP goal <140/90) -Controlled -Current treatment: . hydrochlorothiazide 25 mg daily  . Torsemide 20 mg daily 5-6 hours after first dose of hydrochlorothiazide -Medications previously tried:  atenolol-chlorthalidone, spironolactone -Current home readings: not checking  -Current dietary habits: has a good appetite per patient. Eats oatmeal, toast with jelly, sandwich, meat and vegetables.  -Current exercise habits: limited due to mobility -Denies hypotensive/hypertensive symptoms -Educated on BP goals and benefits of medications for prevention of heart attack, stroke and kidney damage; Daily salt intake goal < 2300 mg; -Counseled to monitor BP at home as needed, document, and provide log at future appointments -Recommended to continue current medication  Hyperlipidemia: (LDL goal < 100) -Not ideally controlled -Current treatment: . simvastatin 40 mg daily  -Medications previously tried: none reported  -Current dietary patterns: vegetables, meat, sandwich, oatmeal -Current exercise habits: limited due to mobility -Educated on  Cholesterol goals;  Benefits of statin for ASCVD risk reduction; Importance of limiting foods high in cholesterol; -Recommended to continue current medication  Depression/Anxiety (Goal: manage symptoms) -Controlled -Current treatment: . Alprazolam 0.5 mg bid prn  . Venlafaxine xr 150 mg daily am . Hydroxyzine 25 mg tid prn  -Medications previously tried/failed: none reported -PHQ9: 13 -Educated on Benefits of medication for symptom control -Recommended to continue current medication  Hypothyroid (Goal: manage symptoms) -Controlled -Current treatment  . Levothyroxine 50 mcg daily -Medications previously tried: none reported  -Recommended to continue current medication  GERD/ abdominal pain/ nausea/ constipation (Goal: manage symptoms) -Controlled -Current treatment  . dicyclomine 10 mg before each meal and at bedtime prn colon spasm . Linzess 290 mcg capsule every morning . Pantoprazole 40 mg bid  . Ondansetron 4 mg tid prn nausea -Medications previously tried: none reported  -Recommended to continue current medication  Osteoarthritis of lumbar spine  (Goal: manage pain) -Controlled -Current treatment  . hydrocodone-acetaminophen 5-325 mg bid prn severe pain  . Gabapentin 300 mg tid  . Diclofenac 1% gel qid . Lidocaine 5% 3 patches onto skin every 12 hours -Medications previously tried: none reported  -Recommended to continue current medication  COPD (Goal: manage symtpoms) -Controlled -Current treatment  . Breztri-aerosphere 2 puffs twice daily  . flonase 2 sprays each nostril daily -Medications previously tried: Trelegy  -Recommended to continue current medication Collaborated with provider to order spacer. Consulted pulmonology and DME company to assist in sodium chloride 3% nebulizer solution.   Health Maintenance -Vaccine gaps: COVID vaccine and Shingrix -Current therapy:  . nitroglycerin 0.4 mg sl every 5 minutes prn chest pain  . Meclizine 12.5 mg every 8  hours prn dizziness . Cyanocobalamin 2500 mcg daily  . Vitamin d daily . Potassium 20 meq 3 tablets daily  -Patient is satisfied with current therapy and denies issues -Recommended to continue current medication   Patient Goals/Self-Care Activities . Patient will:  - take medications as prescribed focus on medication adherence by pill box collaborate with provider on medication access solutions  Follow Up Plan: Telephone follow up appointment with care management team member scheduled for: 08/2021      Stephanie Clay was given information about Chronic  Care Management services today including:  1. CCM service includes personalized support from designated clinical staff supervised by her physician, including individualized plan of care and coordination with other care providers 2. 24/7 contact phone numbers for assistance for urgent and routine care needs. 3. Standard insurance, coinsurance, copays and deductibles apply for chronic care management only during months in which we provide at least 20 minutes of these services. Most insurances cover these services at 100%, however patients may be responsible for any copay, coinsurance and/or deductible if applicable. This service may help you avoid the need for more expensive face-to-face services. 4. Only one practitioner may furnish and bill the service in a calendar month. 5. The patient may stop CCM services at any time (effective at the end of the month) by phone call to the office staff.  Patient agreed to services and verbal consent obtained.   The patient verbalized understanding of instructions, educational materials, and care plan provided today and declined offer to receive copy of patient instructions, educational materials, and care plan.  Telephone follow up appointment with pharmacy team member scheduled for: 08/2021  Juliane Lack, PharmD Clinical Pharmacist [facility] [phone] (office) [phone]  (mobile)  American Journal for Respiratory Critical Care Medicine, 190, P5-P6. Retrieved from https://www.thoracic.org/patients/patient-resources/resources/metered-dose-inhaler-mdi.pdf">  How to Use a Metered Dose Inhaler  A metered dose inhaler (MDI) is a handheld device filled with medicine that must be breathed into the lungs (inhaled). The medicine is delivered by pushing down on a metal canister. This releases a preset amount of spray and mist through the mouth and into the lungs. Each MDI canister holds a certain number of doses (puffs). Using a spacer with a metered dose inhaler may be recommended to help get more medicine into the lungs. A spacer is a plastic tube that connects to the MDI on one end and has a mouthpiece on the other end. A spacer holds the medicine in the tube for a short time. This allows more medicine to be inhaled. The MDI can be used to deliver many kinds of inhaled medicines, including:  Quick relief or rescue medicines, such as bronchodilators.  Controller medicines, such as corticosteroids. What are the risks?  If you do not use your inhaler correctly, medicine might not reach your lungs to help you breathe.  If you do not have enough strength to push down the canister to make it spray, ask your health care provider for ways to help.  The medicine in the MDI may cause side effects, such as: ? Mouth sores (thrush). ? Cough. ? Hoarseness. ? Shakiness. ? Headache. Supplies needed:  A metered dose inhaler.  A spacer, if recommended. How to use a metered dose inhaler without a spacer 1. Remove the cap from the inhaler. 2. If you are using the inhaler for the first time, shake it for 5 seconds, turn it away from your face, then release 4 puffs into the air. This is called priming. 3. Shake the inhaler for 5 seconds. 4. Position the inhaler so the top of the canister faces up. 5. Put your index finger on the top of the medicine canister. Support the bottom  of the inhaler with your thumb. 6. Breathe out normally and as completely as possible, away from the inhaler. 7. Either place the inhaler between your teeth and close your lips tightly around the mouthpiece, or hold the inhaler 1-2 inches (2.5-5 cm) away from your open mouth. Keep your tongue down out of the way. If  you are unsure which technique to use, ask your health care provider. 8. Press the canister down with your index finger to release the medicine. Inhale deeply and slowly through your mouth until your lungs are completely filled. Do not breathe in through your nose. Inhaling should take 4-6 seconds. 9. Hold the medicine in your lungs for 5-10 seconds (10 seconds is best). This helps the medicine get into the small airways of your lungs. 10. Remove the inhaler from your mouth, turn your head, and breathe out normally. 11. Wait about 1 minute between puffs or as directed. Then repeat steps 3-10 until you have taken the number of puffs that your health care provider directed. 12. Put the cap on the inhaler. 13. If you are using a steroid inhaler, rinse your mouth with water, gargle, and spit out the water. Do not swallow the water.   How to use a metered dose inhaler with a spacer 1. Remove the cap from the inhaler. 2. If you are using the inhaler for the first time, shake it for 5 seconds, turn it away from your face, then release 4 puffs into the air. This is called priming. 3. Shake the inhaler for 5 seconds. 4. Place the open end of the spacer onto the inhaler mouthpiece. 5. Position the inhaler so the top of the canister faces up and the spacer mouthpiece faces you. 6. Put your index finger on the top of the medicine canister. Support the bottom of the inhaler and the spacer with your thumb. 7. Breathe out normally and as completely as possible, away from the spacer. 8. Place the spacer between your teeth and close your lips tightly around it. Keep your tongue down out of the  way. 9. Press the canister down with your index finger to release the medicine, then inhale deeply and slowly through your mouth until your lungs are completely filled. Do not breathe in through your nose. Inhaling should take 4-6 seconds. 10. Hold the medicine in your lungs for 5-10 seconds (10 seconds is best). This helps the medicine get into the small airways of your lungs. 11. Remove the spacer from your mouth, turn your head, and breathe out normally. 12. Wait about 1 minute between puffs or as directed. Then repeat steps 3-11 until you have taken the number of puffs that your health care provider directed. 13. Remove the spacer from the inhaler and put the cap on the inhaler. 14. If you are using a steroid inhaler, rinse your mouth with water, gargle, and spit out the water. Do not swallow the water.   Follow these instructions at home: Caring for your MDI  Store your inhaler at or near room temperature. A cold MDI will not work properly.  Follow directions on the package insert for care and cleaning of your MDI and spacer. General instructions  Take your inhaled medicine only as told by your health care provider. Do not use the inhaler more than directed by your health care provider.  Refill your MDI with medicine before all the preset doses have been used. ? If your inhaler has a counter, check it to determine how full your MDI is. The number you see tells you how many doses are left. ? If your inhaler does not have a counter, ask your health care provider when you will need to refill it. Then write the refill date on a calendar or on your MDI canister. ? Keep in mind that you cannot tell when the medicine  in an inhaler is empty by shaking it. You may feel or hear something in the canister even when the preset medicine doses have been used up. Keeping track of your dosages is important.  Do not use any products that contain nicotine or tobacco, such as cigarettes, e-cigarettes, and  chewing tobacco. If you need help quitting, ask your health care provider.  Keep all follow-up visits as told by your health care provider. This is important. Where to find more information  Centers for Disease Control and Prevention: FootballExhibition.com.br  American Lung Association: www.lung.org Contact a health care provider if:  Symptoms are only partially relieved with your inhaler.  You are having trouble using your inhaler.  You have side effects from the medicine.  You have chills or a fever.  You have night sweats.  There is blood in your thick saliva (phlegm). Get help right away if:  You have dizziness.  You have a fast heart rate.  You have severe shortness of breath.  You have difficulty breathing. These symptoms may represent a serious problem that is an emergency. Do not wait to see if the symptoms will go away. Get medical help right away. Call your local emergency services (911 in the [place] Do not drive yourself to the hospital. Summary  A metered dose inhaler is a handheld device for taking medicine that must be breathed into the lungs (inhaled).  Take your inhaled medicine only as told by your health care provider. Do not use the inhaler more than directed by your health care provider.  You cannot tell when the medicine is gone in an inhaler by shaking it. Refill it with medicine before all the preset doses have been used.  Follow directions on the package insert for care and cleaning of your MDI and spacer. This information is not intended to replace advice given to you by your health care provider. Make sure you discuss any questions you have with your health care provider. Document Revised: 11/24/2019 Document Reviewed: 11/24/2019 Elsevier Patient Education  2021 [employer].

## 2021-03-02 NOTE — Progress Notes (Signed)
03/02/21   Lucia Gaskins CPP, asked if I would call daughter and let them know her spacer was sent to [facility] and that her nebules have been sent to [facility].  I left a message for [name].  Leilani Able, CMA Clinical Pharmacist Assistant [phone]

## 2021-03-03 ENCOUNTER — Other Ambulatory Visit: Payer: Self-pay | Admitting: Family Medicine

## 2021-03-03 ENCOUNTER — Other Ambulatory Visit: Payer: Self-pay | Admitting: Physician Assistant

## 2021-03-06 ENCOUNTER — Other Ambulatory Visit: Payer: Self-pay | Admitting: Family Medicine

## 2021-03-07 ENCOUNTER — Other Ambulatory Visit: Payer: Self-pay | Admitting: Family Medicine

## 2021-03-16 ENCOUNTER — Other Ambulatory Visit: Payer: Self-pay

## 2021-03-16 ENCOUNTER — Ambulatory Visit (INDEPENDENT_AMBULATORY_CARE_PROVIDER_SITE_OTHER): Payer: Medicare Other | Admitting: Family Medicine

## 2021-03-16 VITALS — BP 134/60 | HR 80 | Temp 97.2°F | Resp 16 | Ht 59.0 in | Wt 73.6 lb

## 2021-03-16 DIAGNOSIS — M810 Age-related osteoporosis without current pathological fracture: Secondary | ICD-10-CM | POA: Diagnosis not present

## 2021-03-16 DIAGNOSIS — E782 Mixed hyperlipidemia: Secondary | ICD-10-CM

## 2021-03-16 DIAGNOSIS — I10 Essential (primary) hypertension: Secondary | ICD-10-CM

## 2021-03-16 DIAGNOSIS — M4125 Other idiopathic scoliosis, thoracolumbar region: Secondary | ICD-10-CM

## 2021-03-16 DIAGNOSIS — K5904 Chronic idiopathic constipation: Secondary | ICD-10-CM | POA: Diagnosis not present

## 2021-03-16 DIAGNOSIS — J189 Pneumonia, unspecified organism: Secondary | ICD-10-CM

## 2021-03-16 NOTE — Progress Notes (Signed)
Subjective:  Patient ID: Stephanie Clay, female    DOB: [birth year]  Age: 87 y.o. MRN: [id number]  Chief Complaint  Patient presents with  . Constipation  . Fatigue    HPI Continued cough and sob, may be improved. Coughed up blood twice. Seeing [place] pul[facility]ology (Dr. Delton Coombes.) CT scan of chest showed possible MAI. Pt working on getting sputum for AFB stain and culture.  Constipation: On linzess 145 mg once daily. Fiber cereal.  Complaining of muscle spasms in back. Pt has scoliosis.   Current Outpatient Medications on File Prior to Visit  Medication Sig Dispense Refill  . albuterol (VENTOLIN HFA) 108 (90 Base) MCG/ACT inhaler Inhale 2 puffs into the lungs every 6 (six) hours as needed for wheezing or shortness of breath. 8 g 6  . ALPRAZolam (XANAX) 0.5 MG tablet TAKE 1 TABLET BY MOUTH TWICE A DAY AS NEEDED 60 tablet 1  . Budeson-Glycopyrrol-Formoterol (BREZTRI AEROSPHERE) 160-9-4.8 MCG/ACT AERO Inhale 2 puffs into the lungs in the morning and at bedtime. (Patient taking differently: Inhale 2 puffs into the lungs 2 (two) times daily.) 10.7 g 3  . Cholecalciferol (VITAMIN D3 PO) Take by mouth daily.    . Cyanocobalamin 2500 MCG TABS Take by mouth daily.    . diclofenac Sodium (VOLTAREN) 1 % GEL Apply topically 4 (four) times daily.    Marland Kitchen dicyclomine (BENTYL) 10 MG capsule TAKE 1 CAPSULE BY MOUTH BEFORE EACH MEAL AND AT BEDTIME AS NEEDED FOR COLON SPASM 120 capsule 0  . fluticasone (FLONASE) 50 MCG/ACT nasal spray USE 2 SPRAYS IN EACH NOSTRIL DAILY. 16 g 4  . gabapentin (NEURONTIN) 300 MG capsule Take 1 capsule (300 mg total) by mouth 3 (three) times daily. 90 capsule 1  . hydrochlorothiazide (HYDRODIURIL) 25 MG tablet TAKE 1 TABLET BY MOUTH DAILY 90 tablet 1  . HYDROcodone-acetaminophen (NORCO/VICODIN) 5-325 MG tablet Take 1 tablet by mouth 2 (two) times daily as needed for severe pain (OSteoarthritis of lumbar spine.). 60 tablet 0  . hydrOXYzine (ATARAX/VISTARIL) 25 MG tablet  Take 25 mg by mouth 3 (three) times daily as needed.    Marland Kitchen levothyroxine (SYNTHROID) 50 MCG tablet TAKE 1 TABLET BY MOUTH DAILY 90 tablet 0  . lidocaine (LIDODERM) 5 % Place 3 patches onto the skin every 12 (twelve) hours.    Marland Kitchen linaclotide (LINZESS) 145 MCG CAPS capsule Take 145 mcg by mouth every morning.    . meclizine (ANTIVERT) 12.5 MG tablet TAKE 1 TABLET EVERY 8 HOURS AS NEEDED FOR DIZZINESS 40 tablet 0  . nitroGLYCERIN (NITROSTAT) 0.4 MG SL tablet Place 0.4 mg under the tongue every 5 (five) minutes as needed for chest pain. X 3 doses    . ondansetron (ZOFRAN) 4 MG tablet TAKE 1 TABLET BY MOUTH THREE TIMES A DAY FOR NAUSEA. 90 tablet 0  . pantoprazole (PROTONIX) 40 MG tablet TAKE 1 TABLET TWICE DAILY (Patient taking differently: Take 40 mg by mouth daily.) 180 tablet 1  . potassium chloride SA (KLOR-CON) 20 MEQ tablet TAKE 3 TABLETS BY MOUTH ONCE DAILY. (Patient taking differently: Take 60 mEq by mouth daily.) 90 tablet 0  . simvastatin (ZOCOR) 40 MG tablet TAKE 1 TABLET BY MOUTH DAILY 90 tablet 0  . sodium chloride HYPERTONIC 3 % nebulizer solution Take by nebulization in the morning and at bedtime. 750 mL 3  . Spacer/Aero-Holding Chambers (AEROCHAMBER PLUS) inhaler Use as instructed 1 each 2  . torsemide (DEMADEX) 20 MG tablet TAKE 1 TABLET BY MOUTH DAILY 5-6  HOURS AFTER FIRST DOSE OF HYDROCHLOROTHIAZIDE 90 tablet 2  . triamcinolone (KENALOG) 0.1 % paste Use as directed 1 application in the mouth or throat 2 (two) times daily. (Patient not taking: Reported on 03/01/2021) 5 g 0  . Venlafaxine HCl 150 MG TB24 Take 1 tablet (150 mg total) by mouth in the morning. 30 tablet 1   No current facility-administered medications on file prior to visit.   Past Medical History:  Diagnosis Date  . Anemia   . Aneurysm of other specified arteries (HCC)   . Anxiety   . Arthritis   . Asthma   . Bronchiectasis without acute exacerbation (HCC) 01/03/2015   CT chest 12/2014:  Bronchiectasis throughout  right lung, left clear.  +tree and bud, + patchy gg infiltrates in RLL Sputum 2016: no growth, AFB smear neg PFT's 02/2015:  Normal except mild decrease in DLCO   . Cataract    bilateral eye surgery  . Chronic chest pain   . Chronic idiopathic constipation   . Chronic kidney disease, stage 3 (HCC)   . Chronic venous hypertension (idiopathic) with other complications of bilateral lower extremity   . Depression   . Gastro-esophageal reflux   . Heart murmur   . Hyperlipemia   . Hypertension   . Idiopathic progressive neuropathy   . Iron (Fe) deficiency anemia   . Moderate protein-calorie malnutrition (HCC)   . Other idiopathic scoliosis, lumbar region   . Pneumathemia (HCC)   . Primary generalized (osteo)arthritis   . Primary insomnia   . Radiculopathy, lumbar region   . Scoliosis   . Secondary hyperparathyroidism (HCC)   . Thyroid disease   . Unilateral primary osteoarthritis, left knee   . Unilateral primary osteoarthritis, right knee   . Varicose veins    Bilateral leg  . Vitamin D deficiency, unspecified    Past Surgical History:  Procedure Laterality Date  . ABDOMINAL HYSTERECTOMY    . CYSTOCELE REPAIR    . SALPINGOOPHORECTOMY      Family History  Problem Relation Age of Onset  . Emphysema Father   . Heart disease Mother   . Cancer Sister        metastatic lung cancer  . Heart disease Sister   . Hypertension Sister   . Varicose Veins Sister   . Hyperlipidemia Daughter   . Hypertension Daughter   . Diabetes Daughter   . Hyperlipidemia Daughter   . Hypertension Daughter   . Thyroid disease Daughter   . Thyroid disease Daughter   . Colon cancer Neg Hx   . Esophageal cancer Neg Hx   . Stomach cancer Neg Hx    Social History   Socioeconomic History  . Marital status: Widowed    Spouse name: carl  . Number of children: 3  . Years of education: 9  . Highest education level: Not on file  Occupational History  . Occupation: retired  Tobacco Use  . Smoking  status: Never Smoker  . Smokeless tobacco: Never Used  Vaping Use  . Vaping Use: Never used  Substance and Sexual Activity  . Alcohol use: No    Alcohol/week: 0.0 standard drinks  . Drug use: No  . Sexual activity: Not Currently  Other Topics Concern  . Not on file  Social History Narrative   Right handed   Social Determinants of Health   Financial Resource Strain: Not on file  Food Insecurity: No Food Insecurity  . Worried About Programme researcher, broadcasting/film/videounning Out of Food in the Last Year:  Never true  . Ran Out of Food in the Last Year: Never true  Transportation Needs: No Transportation Needs  . Lack of Transportation (Medical): No  . Lack of Transportation (Non-Medical): No  Physical Activity: Not on file  Stress: Not on file  Social Connections: Not on file    Review of Systems  Constitutional: Positive for fatigue. Negative for chills and fever.  HENT: Negative for congestion, rhinorrhea and sore throat.   Respiratory: Positive for cough. Negative for shortness of breath.   Cardiovascular: Negative for chest pain.  Gastrointestinal: Positive for abdominal pain, constipation and nausea. Negative for diarrhea and vomiting.  Genitourinary: Negative for dysuria and urgency.  Musculoskeletal: Positive for arthralgias, back pain and myalgias.  Neurological: Positive for headaches. Negative for dizziness, weakness and light-headedness.  Psychiatric/Behavioral: Negative for dysphoric mood. The patient is not nervous/anxious.      Objective:  BP 134/60   Pulse 80   Temp (!) 97.2 F (36.2 C)   Resp 16   Ht 4\' 11"  (1.499 m)   Wt 73 lb 9.6 oz (33.4 kg)   BMI 14.87 kg/m   BP/Weight 03/16/2021 02/24/2021 02/17/2021  Systolic BP 134 140 144  Diastolic BP 60 66 80  Wt. (Lbs) 73.6 77 -  BMI 14.87 15.55 -    Physical Exam Vitals reviewed.  Constitutional:      Appearance: Normal appearance.     Comments: Thin.  Neck:     Vascular: No carotid bruit.  Cardiovascular:     Rate and Rhythm:  Normal rate and regular rhythm.     Pulses: Normal pulses.     Heart sounds: Normal heart sounds. No murmur heard.   Pulmonary:     Effort: Pulmonary effort is normal. No respiratory distress.     Breath sounds: Wheezing and rhonchi present.  Abdominal:     General: Abdomen is flat. Bowel sounds are normal.     Palpations: Abdomen is soft.     Tenderness: There is no abdominal tenderness.  Neurological:     Mental Status: She is alert and oriented to person, place, and time.  Psychiatric:        Mood and Affect: Mood normal.        Behavior: Behavior normal.     Lab Results  Component Value Date   WBC 13.9 (H) 10/03/2020   HGB 10.3 (L) 10/03/2020   HCT 30.3 (L) 10/03/2020   PLT 421 10/03/2020   GLUCOSE 137 (H) 10/03/2020   CHOL 193 09/09/2020   TRIG 71 09/09/2020   HDL 58 09/09/2020   LDLCALC 122 (H) 09/09/2020   ALT 15 10/03/2020   AST 21 10/03/2020   NA 141 10/03/2020   K 3.7 10/03/2020   CL 99 10/03/2020   CREATININE 1.09 (H) 10/03/2020   BUN 33 (H) 10/03/2020   CO2 26 10/03/2020   TSH 4.370 10/03/2020   HGBA1C 5.5 03/08/2020      Assessment & Plan:   1. Mixed hyperlipidemia Well controlled.  No changes to medicines.  Continue to work on eating a healthy diet and exercise.  Labs drawn today.  - Lipid panel  2. Essential hypertension, benign Well controlled.  No changes to medicines.  Continue to work on eating a healthy diet and exercise.  Labs drawn today.  - CBC with Differential/Platelet - Comprehensive metabolic panel  3. Senile osteoporosis - VITAMIN D 25 Hydroxy (Vit-D Deficiency, Fractures)   4. Other idiopathic scoliosis, thoracolumbar region Recommend try ice. Continue hydrocodone.  5. Pulmonary infection Follow up with Dr. Delton Coombes.  Work on getting sputum AFB stain.   6. Chronic idiopathic constipation Start on trulance 3 mg once daily. Hold linzess. Continue fiber.   Orders Placed This Encounter  Procedures  . CBC with  Differential/Platelet  . Comprehensive metabolic panel  . Lipid panel  . VITAMIN D 25 Hydroxy (Vit-D Deficiency, Fractures)     Follow-up: Return in about 6 weeks (around 04/27/2021).  An After Visit Summary was printed and given to the patient.  Blane Ohara, MD [facility] [phone]

## 2021-03-16 NOTE — Patient Instructions (Signed)
Continue Linzess until able to get trulance 3 mg once daily from pharmacy.  Scoliosis/muscle spasm: Try ice.  Get that sputum.

## 2021-03-17 LAB — LIPID PANEL
Chol/HDL Ratio: 2.1 ratio (ref 0.0–4.4)
Cholesterol, Total: 160 mg/dL (ref 100–199)
HDL: 76 mg/dL (ref >39)
LDL Chol Calc (NIH): 70 mg/dL (ref 0–99)
Triglycerides: 75 mg/dL (ref 0–149)
VLDL Cholesterol Cal: 14 mg/dL (ref 5–40)

## 2021-03-17 LAB — CBC WITH DIFFERENTIAL/PLATELET
Basophils Absolute: 0.1 10*3/uL (ref 0.0–0.2)
Basos: 1 %
EOS (ABSOLUTE): 0.5 10*3/uL — ABNORMAL HIGH (ref 0.0–0.4)
Eos: 4 %
Hematocrit: 33.6 % — ABNORMAL LOW (ref 34.0–46.6)
Hemoglobin: 11.1 g/dL (ref 11.1–15.9)
Immature Grans (Abs): 0 10*3/uL (ref 0.0–0.1)
Immature Granulocytes: 0 %
Lymphocytes Absolute: 2.7 10*3/uL (ref 0.7–3.1)
Lymphs: 20 %
MCH: 30.8 pg (ref 26.6–33.0)
MCHC: 33 g/dL (ref 31.5–35.7)
MCV: 93 fL (ref 79–97)
Monocytes Absolute: 1.1 10*3/uL — ABNORMAL HIGH (ref 0.1–0.9)
Monocytes: 8 %
Neutrophils Absolute: 9 10*3/uL — ABNORMAL HIGH (ref 1.4–7.0)
Neutrophils: 67 %
Platelets: 269 10*3/uL (ref 150–450)
RBC: 3.6 x10E6/uL — ABNORMAL LOW (ref 3.77–5.28)
RDW: 12.3 % (ref 11.7–15.4)
WBC: 13.4 10*3/uL — ABNORMAL HIGH (ref 3.4–10.8)

## 2021-03-17 LAB — COMPREHENSIVE METABOLIC PANEL
ALT: 9 IU/L (ref 0–32)
AST: 19 IU/L (ref 0–40)
Albumin/Globulin Ratio: 1.3 (ref 1.2–2.2)
Albumin: 4.4 g/dL (ref 3.6–4.6)
Alkaline Phosphatase: 74 IU/L (ref 44–121)
BUN/Creatinine Ratio: 36 — ABNORMAL HIGH (ref 12–28)
BUN: 40 mg/dL — ABNORMAL HIGH (ref 8–27)
Bilirubin Total: 0.3 mg/dL (ref 0.0–1.2)
CO2: 31 mmol/L — ABNORMAL HIGH (ref 20–29)
Calcium: 10.3 mg/dL (ref 8.7–10.3)
Chloride: 95 mmol/L — ABNORMAL LOW (ref 96–106)
Creatinine, Ser: 1.12 mg/dL — ABNORMAL HIGH (ref 0.57–1.00)
Globulin, Total: 3.5 g/dL (ref 1.5–4.5)
Glucose: 92 mg/dL (ref 65–99)
Potassium: 3.9 mmol/L (ref 3.5–5.2)
Sodium: 143 mmol/L (ref 134–144)
Total Protein: 7.9 g/dL (ref 6.0–8.5)
eGFR: 48 mL/min/{1.73_m2} — ABNORMAL LOW (ref >59)

## 2021-03-17 LAB — VITAMIN D 25 HYDROXY (VIT D DEFICIENCY, FRACTURES): Vit D, 25-Hydroxy: 99.9 ng/mL (ref 30.0–100.0)

## 2021-03-17 LAB — CARDIOVASCULAR RISK ASSESSMENT

## 2021-03-20 ENCOUNTER — Encounter: Payer: Self-pay | Admitting: Family Medicine

## 2021-03-21 ENCOUNTER — Other Ambulatory Visit: Payer: Self-pay | Admitting: Physician Assistant

## 2021-03-25 ENCOUNTER — Other Ambulatory Visit: Payer: Self-pay | Admitting: Family Medicine

## 2021-03-27 ENCOUNTER — Other Ambulatory Visit: Payer: Self-pay | Admitting: Family Medicine

## 2021-03-30 ENCOUNTER — Telehealth: Payer: Self-pay

## 2021-03-30 NOTE — Progress Notes (Signed)
Chronic Care Management Pharmacy Assistant   Name: Stephanie Clay  MRN: [id number] DOB: 04/13/1932  Reason for Encounter: follow up on income needed for PAP applications       Medications: Outpatient Encounter Medications as of 03/30/2021  Medication Sig Note   albuterol (VENTOLIN HFA) 108 (90 Base) MCG/ACT inhaler Inhale 2 puffs into the lungs every 6 (six) hours as needed for wheezing or shortness of breath.    ALPRAZolam (XANAX) 0.5 MG tablet TAKE 1 TABLET BY MOUTH TWICE A DAY AS NEEDED    Budeson-Glycopyrrol-Formoterol (BREZTRI AEROSPHERE) 160-9-4.8 MCG/ACT AERO Inhale 2 puffs into the lungs in the morning and at bedtime. (Patient taking differently: Inhale 2 puffs into the lungs 2 (two) times daily.)    Cholecalciferol (VITAMIN D3 PO) Take by mouth daily.    Cyanocobalamin 2500 MCG TABS Take by mouth daily.    diclofenac Sodium (VOLTAREN) 1 % GEL Apply topically 4 (four) times daily.    dicyclomine (BENTYL) 10 MG capsule TAKE 1 CAPSULE BY MOUTH BEFORE EACH MEAL AND AT BEDTIME AS NEEDED FOR COLON SPASM    fluticasone (FLONASE) 50 MCG/ACT nasal spray USE 2 SPRAYS IN EACH NOSTRIL DAILY. 03/01/2021: Taking prn per patient    gabapentin (NEURONTIN) 300 MG capsule Take 1 capsule (300 mg total) by mouth 3 (three) times daily.    hydrochlorothiazide (HYDRODIURIL) 25 MG tablet TAKE 1 TABLET BY MOUTH DAILY    HYDROcodone-acetaminophen (NORCO/VICODIN) 5-325 MG tablet Take 1 tablet by mouth 2 (two) times daily as needed for severe pain (OSteoarthritis of lumbar spine.).    hydrOXYzine (ATARAX/VISTARIL) 25 MG tablet Take 25 mg by mouth 3 (three) times daily as needed.    levothyroxine (SYNTHROID) 50 MCG tablet TAKE 1 TABLET BY MOUTH DAILY    lidocaine (LIDODERM) 5 % Place 3 patches onto the skin every 12 (twelve) hours.    linaclotide (LINZESS) 145 MCG CAPS capsule Take 145 mcg by mouth every morning.    meclizine (ANTIVERT) 12.5 MG tablet TAKE 1 TABLET BY MOUTH EVERY 8 HOURS AS  NEEDED FOR DIZZINESS    nitroGLYCERIN (NITROSTAT) 0.4 MG SL tablet Place 0.4 mg under the tongue every 5 (five) minutes as needed for chest pain. X 3 doses    ondansetron (ZOFRAN) 4 MG tablet TAKE 1 TABLET BY MOUTH THREE TIMES A DAY FOR NAUSEA.    pantoprazole (PROTONIX) 40 MG tablet TAKE 1 TABLET TWICE DAILY (Patient taking differently: Take 40 mg by mouth daily.)    potassium chloride SA (KLOR-CON) 20 MEQ tablet TAKE 3 TABLETS BY MOUTH ONCE DAILY.    simvastatin (ZOCOR) 40 MG tablet TAKE 1 TABLET BY MOUTH DAILY    sodium chloride HYPERTONIC 3 % nebulizer solution Take by nebulization in the morning and at bedtime.    Spacer/Aero-Holding Chambers (AEROCHAMBER PLUS) inhaler Use as instructed    torsemide (DEMADEX) 20 MG tablet TAKE 1 TABLET BY MOUTH DAILY 5-6 HOURS AFTER FIRST DOSE OF HYDROCHLOROTHIAZIDE    triamcinolone (KENALOG) 0.1 % paste Use as directed 1 application in the mouth or throat 2 (two) times daily. (Patient not taking: Reported on 03/01/2021)    Venlafaxine HCl 150 MG TB24 Take 1 tablet (150 mg total) by mouth in the morning.    No facility-administered encounter medications on file as of 03/30/2021.     Lucia Gaskins CPP asked me to reach out to the patient daughter and remind her we need proof of income to complete the PAP forms for patient.  6/16/22Huntley Dec  Brown CPP, had not received the information.  I left another message for patients daughter as to what is needed to complete the forms.  I have notified Lucia Gaskins CPP.   Gaps AWV: none listed   [facility] Levothyroxine  01/30/21 90 Simvastatin  03/06/21 90 HCTZ   03/06/21 90   Leilani Able, [place] Clinical Pharmacist Assistant [phone]

## 2021-04-11 ENCOUNTER — Other Ambulatory Visit: Payer: Self-pay | Admitting: Family Medicine

## 2021-04-11 ENCOUNTER — Other Ambulatory Visit: Payer: Self-pay | Admitting: Physician Assistant

## 2021-04-19 DIAGNOSIS — S0990XA Unspecified injury of head, initial encounter: Secondary | ICD-10-CM | POA: Diagnosis not present

## 2021-04-19 DIAGNOSIS — M47812 Spondylosis without myelopathy or radiculopathy, cervical region: Secondary | ICD-10-CM | POA: Diagnosis not present

## 2021-04-19 DIAGNOSIS — Z7982 Long term (current) use of aspirin: Secondary | ICD-10-CM | POA: Diagnosis not present

## 2021-04-19 DIAGNOSIS — N183 Chronic kidney disease, stage 3 unspecified: Secondary | ICD-10-CM | POA: Diagnosis not present

## 2021-04-19 DIAGNOSIS — Z4789 Encounter for other orthopedic aftercare: Secondary | ICD-10-CM | POA: Diagnosis not present

## 2021-04-19 DIAGNOSIS — S199XXA Unspecified injury of neck, initial encounter: Secondary | ICD-10-CM | POA: Diagnosis not present

## 2021-04-19 DIAGNOSIS — J439 Emphysema, unspecified: Secondary | ICD-10-CM | POA: Diagnosis not present

## 2021-04-19 DIAGNOSIS — F32A Depression, unspecified: Secondary | ICD-10-CM | POA: Diagnosis not present

## 2021-04-19 DIAGNOSIS — K5909 Other constipation: Secondary | ICD-10-CM | POA: Diagnosis not present

## 2021-04-19 DIAGNOSIS — Z743 Need for continuous supervision: Secondary | ICD-10-CM | POA: Diagnosis not present

## 2021-04-19 DIAGNOSIS — R509 Fever, unspecified: Secondary | ICD-10-CM | POA: Diagnosis not present

## 2021-04-19 DIAGNOSIS — D649 Anemia, unspecified: Secondary | ICD-10-CM | POA: Diagnosis not present

## 2021-04-19 DIAGNOSIS — S72002A Fracture of unspecified part of neck of left femur, initial encounter for closed fracture: Secondary | ICD-10-CM | POA: Diagnosis not present

## 2021-04-19 DIAGNOSIS — K219 Gastro-esophageal reflux disease without esophagitis: Secondary | ICD-10-CM | POA: Diagnosis not present

## 2021-04-19 DIAGNOSIS — R278 Other lack of coordination: Secondary | ICD-10-CM | POA: Diagnosis not present

## 2021-04-19 DIAGNOSIS — S72142D Displaced intertrochanteric fracture of left femur, subsequent encounter for closed fracture with routine healing: Secondary | ICD-10-CM | POA: Diagnosis not present

## 2021-04-19 DIAGNOSIS — Z6824 Body mass index (BMI) 24.0-24.9, adult: Secondary | ICD-10-CM | POA: Diagnosis not present

## 2021-04-19 DIAGNOSIS — R531 Weakness: Secondary | ICD-10-CM | POA: Diagnosis not present

## 2021-04-19 DIAGNOSIS — E440 Moderate protein-calorie malnutrition: Secondary | ICD-10-CM | POA: Diagnosis not present

## 2021-04-19 DIAGNOSIS — E876 Hypokalemia: Secondary | ICD-10-CM | POA: Diagnosis not present

## 2021-04-19 DIAGNOSIS — Z86718 Personal history of other venous thrombosis and embolism: Secondary | ICD-10-CM | POA: Diagnosis not present

## 2021-04-19 DIAGNOSIS — Z043 Encounter for examination and observation following other accident: Secondary | ICD-10-CM | POA: Diagnosis not present

## 2021-04-19 DIAGNOSIS — S72142A Displaced intertrochanteric fracture of left femur, initial encounter for closed fracture: Secondary | ICD-10-CM | POA: Diagnosis not present

## 2021-04-19 DIAGNOSIS — E039 Hypothyroidism, unspecified: Secondary | ICD-10-CM | POA: Diagnosis not present

## 2021-04-19 DIAGNOSIS — R059 Cough, unspecified: Secondary | ICD-10-CM | POA: Diagnosis not present

## 2021-04-19 DIAGNOSIS — J479 Bronchiectasis, uncomplicated: Secondary | ICD-10-CM | POA: Diagnosis not present

## 2021-04-19 DIAGNOSIS — J45909 Unspecified asthma, uncomplicated: Secondary | ICD-10-CM | POA: Diagnosis not present

## 2021-04-19 DIAGNOSIS — R0602 Shortness of breath: Secondary | ICD-10-CM | POA: Diagnosis not present

## 2021-04-19 DIAGNOSIS — R000 Tachycardia, unspecified: Secondary | ICD-10-CM | POA: Diagnosis not present

## 2021-04-19 DIAGNOSIS — I471 Supraventricular tachycardia: Secondary | ICD-10-CM | POA: Diagnosis not present

## 2021-04-19 DIAGNOSIS — E785 Hyperlipidemia, unspecified: Secondary | ICD-10-CM | POA: Diagnosis not present

## 2021-04-19 DIAGNOSIS — Z79899 Other long term (current) drug therapy: Secondary | ICD-10-CM | POA: Diagnosis not present

## 2021-04-19 DIAGNOSIS — M150 Primary generalized (osteo)arthritis: Secondary | ICD-10-CM | POA: Diagnosis not present

## 2021-04-19 DIAGNOSIS — I129 Hypertensive chronic kidney disease with stage 1 through stage 4 chronic kidney disease, or unspecified chronic kidney disease: Secondary | ICD-10-CM | POA: Diagnosis not present

## 2021-04-19 DIAGNOSIS — M25522 Pain in left elbow: Secondary | ICD-10-CM | POA: Diagnosis not present

## 2021-04-19 DIAGNOSIS — J9811 Atelectasis: Secondary | ICD-10-CM | POA: Diagnosis not present

## 2021-04-19 DIAGNOSIS — A319 Mycobacterial infection, unspecified: Secondary | ICD-10-CM | POA: Diagnosis not present

## 2021-04-19 DIAGNOSIS — K5904 Chronic idiopathic constipation: Secondary | ICD-10-CM | POA: Diagnosis not present

## 2021-04-19 DIAGNOSIS — Z7401 Bed confinement status: Secondary | ICD-10-CM | POA: Diagnosis not present

## 2021-04-19 DIAGNOSIS — Z881 Allergy status to other antibiotic agents status: Secondary | ICD-10-CM | POA: Diagnosis not present

## 2021-04-19 DIAGNOSIS — W19XXXA Unspecified fall, initial encounter: Secondary | ICD-10-CM | POA: Diagnosis not present

## 2021-04-19 DIAGNOSIS — Z20822 Contact with and (suspected) exposure to covid-19: Secondary | ICD-10-CM | POA: Diagnosis not present

## 2021-04-19 DIAGNOSIS — E860 Dehydration: Secondary | ICD-10-CM | POA: Diagnosis not present

## 2021-04-19 DIAGNOSIS — M6259 Muscle wasting and atrophy, not elsewhere classified, multiple sites: Secondary | ICD-10-CM | POA: Diagnosis not present

## 2021-04-19 DIAGNOSIS — G629 Polyneuropathy, unspecified: Secondary | ICD-10-CM | POA: Diagnosis not present

## 2021-04-19 DIAGNOSIS — S72142S Displaced intertrochanteric fracture of left femur, sequela: Secondary | ICD-10-CM | POA: Diagnosis not present

## 2021-04-19 DIAGNOSIS — J90 Pleural effusion, not elsewhere classified: Secondary | ICD-10-CM | POA: Diagnosis not present

## 2021-04-19 DIAGNOSIS — J449 Chronic obstructive pulmonary disease, unspecified: Secondary | ICD-10-CM | POA: Diagnosis not present

## 2021-04-19 DIAGNOSIS — G319 Degenerative disease of nervous system, unspecified: Secondary | ICD-10-CM | POA: Diagnosis not present

## 2021-04-19 DIAGNOSIS — M1990 Unspecified osteoarthritis, unspecified site: Secondary | ICD-10-CM | POA: Diagnosis not present

## 2021-04-19 DIAGNOSIS — D62 Acute posthemorrhagic anemia: Secondary | ICD-10-CM | POA: Diagnosis not present

## 2021-04-19 DIAGNOSIS — I10 Essential (primary) hypertension: Secondary | ICD-10-CM | POA: Diagnosis not present

## 2021-04-19 DIAGNOSIS — I361 Nonrheumatic tricuspid (valve) insufficiency: Secondary | ICD-10-CM | POA: Diagnosis not present

## 2021-04-19 DIAGNOSIS — R262 Difficulty in walking, not elsewhere classified: Secondary | ICD-10-CM | POA: Diagnosis not present

## 2021-04-19 DIAGNOSIS — R0902 Hypoxemia: Secondary | ICD-10-CM | POA: Diagnosis not present

## 2021-04-19 DIAGNOSIS — I700 Atherosclerosis of aorta: Secondary | ICD-10-CM | POA: Diagnosis not present

## 2021-04-19 DIAGNOSIS — Z888 Allergy status to other drugs, medicaments and biological substances status: Secondary | ICD-10-CM | POA: Diagnosis not present

## 2021-04-19 DIAGNOSIS — E43 Unspecified severe protein-calorie malnutrition: Secondary | ICD-10-CM | POA: Diagnosis not present

## 2021-04-21 DIAGNOSIS — R000 Tachycardia, unspecified: Secondary | ICD-10-CM | POA: Diagnosis not present

## 2021-04-23 DIAGNOSIS — I471 Supraventricular tachycardia: Secondary | ICD-10-CM

## 2021-04-23 DIAGNOSIS — S72142A Displaced intertrochanteric fracture of left femur, initial encounter for closed fracture: Secondary | ICD-10-CM

## 2021-04-23 DIAGNOSIS — I361 Nonrheumatic tricuspid (valve) insufficiency: Secondary | ICD-10-CM

## 2021-04-24 DIAGNOSIS — S72142A Displaced intertrochanteric fracture of left femur, initial encounter for closed fracture: Secondary | ICD-10-CM | POA: Diagnosis not present

## 2021-04-24 DIAGNOSIS — I471 Supraventricular tachycardia: Secondary | ICD-10-CM | POA: Diagnosis not present

## 2021-04-26 DIAGNOSIS — S72142A Displaced intertrochanteric fracture of left femur, initial encounter for closed fracture: Secondary | ICD-10-CM | POA: Diagnosis not present

## 2021-04-26 DIAGNOSIS — E43 Unspecified severe protein-calorie malnutrition: Secondary | ICD-10-CM | POA: Diagnosis not present

## 2021-04-26 DIAGNOSIS — M150 Primary generalized (osteo)arthritis: Secondary | ICD-10-CM | POA: Diagnosis not present

## 2021-04-26 DIAGNOSIS — I471 Supraventricular tachycardia: Secondary | ICD-10-CM | POA: Diagnosis not present

## 2021-04-26 DIAGNOSIS — M25552 Pain in left hip: Secondary | ICD-10-CM | POA: Diagnosis not present

## 2021-04-26 DIAGNOSIS — E782 Mixed hyperlipidemia: Secondary | ICD-10-CM | POA: Diagnosis not present

## 2021-04-26 DIAGNOSIS — R262 Difficulty in walking, not elsewhere classified: Secondary | ICD-10-CM | POA: Diagnosis not present

## 2021-04-26 DIAGNOSIS — E785 Hyperlipidemia, unspecified: Secondary | ICD-10-CM | POA: Diagnosis not present

## 2021-04-26 DIAGNOSIS — D649 Anemia, unspecified: Secondary | ICD-10-CM | POA: Diagnosis not present

## 2021-04-26 DIAGNOSIS — E876 Hypokalemia: Secondary | ICD-10-CM | POA: Diagnosis not present

## 2021-04-26 DIAGNOSIS — K219 Gastro-esophageal reflux disease without esophagitis: Secondary | ICD-10-CM | POA: Diagnosis not present

## 2021-04-26 DIAGNOSIS — I351 Nonrheumatic aortic (valve) insufficiency: Secondary | ICD-10-CM | POA: Diagnosis not present

## 2021-04-26 DIAGNOSIS — S72142S Displaced intertrochanteric fracture of left femur, sequela: Secondary | ICD-10-CM | POA: Diagnosis not present

## 2021-04-26 DIAGNOSIS — R278 Other lack of coordination: Secondary | ICD-10-CM | POA: Diagnosis not present

## 2021-04-26 DIAGNOSIS — J479 Bronchiectasis, uncomplicated: Secondary | ICD-10-CM | POA: Diagnosis not present

## 2021-04-26 DIAGNOSIS — E039 Hypothyroidism, unspecified: Secondary | ICD-10-CM | POA: Diagnosis not present

## 2021-04-26 DIAGNOSIS — R531 Weakness: Secondary | ICD-10-CM | POA: Diagnosis not present

## 2021-04-26 DIAGNOSIS — Z7401 Bed confinement status: Secondary | ICD-10-CM | POA: Diagnosis not present

## 2021-04-26 DIAGNOSIS — Z4789 Encounter for other orthopedic aftercare: Secondary | ICD-10-CM | POA: Diagnosis not present

## 2021-04-26 DIAGNOSIS — R0902 Hypoxemia: Secondary | ICD-10-CM | POA: Diagnosis not present

## 2021-04-26 DIAGNOSIS — J45909 Unspecified asthma, uncomplicated: Secondary | ICD-10-CM | POA: Diagnosis not present

## 2021-04-26 DIAGNOSIS — I10 Essential (primary) hypertension: Secondary | ICD-10-CM | POA: Diagnosis not present

## 2021-04-26 DIAGNOSIS — E440 Moderate protein-calorie malnutrition: Secondary | ICD-10-CM | POA: Diagnosis not present

## 2021-04-26 DIAGNOSIS — S72142D Displaced intertrochanteric fracture of left femur, subsequent encounter for closed fracture with routine healing: Secondary | ICD-10-CM | POA: Diagnosis not present

## 2021-04-26 DIAGNOSIS — K5904 Chronic idiopathic constipation: Secondary | ICD-10-CM | POA: Diagnosis not present

## 2021-04-26 DIAGNOSIS — M6259 Muscle wasting and atrophy, not elsewhere classified, multiple sites: Secondary | ICD-10-CM | POA: Diagnosis not present

## 2021-04-26 DIAGNOSIS — I361 Nonrheumatic tricuspid (valve) insufficiency: Secondary | ICD-10-CM | POA: Diagnosis not present

## 2021-04-26 DIAGNOSIS — J441 Chronic obstructive pulmonary disease with (acute) exacerbation: Secondary | ICD-10-CM | POA: Diagnosis not present

## 2021-04-27 DIAGNOSIS — J441 Chronic obstructive pulmonary disease with (acute) exacerbation: Secondary | ICD-10-CM | POA: Diagnosis not present

## 2021-04-27 DIAGNOSIS — I10 Essential (primary) hypertension: Secondary | ICD-10-CM | POA: Diagnosis not present

## 2021-04-27 DIAGNOSIS — I471 Supraventricular tachycardia: Secondary | ICD-10-CM | POA: Diagnosis not present

## 2021-04-27 DIAGNOSIS — D649 Anemia, unspecified: Secondary | ICD-10-CM | POA: Diagnosis not present

## 2021-04-27 DIAGNOSIS — S72142S Displaced intertrochanteric fracture of left femur, sequela: Secondary | ICD-10-CM | POA: Diagnosis not present

## 2021-04-27 DIAGNOSIS — E039 Hypothyroidism, unspecified: Secondary | ICD-10-CM | POA: Diagnosis not present

## 2021-05-01 DIAGNOSIS — I10 Essential (primary) hypertension: Secondary | ICD-10-CM | POA: Diagnosis not present

## 2021-05-01 DIAGNOSIS — E039 Hypothyroidism, unspecified: Secondary | ICD-10-CM | POA: Diagnosis not present

## 2021-05-01 DIAGNOSIS — I471 Supraventricular tachycardia: Secondary | ICD-10-CM | POA: Diagnosis not present

## 2021-05-01 DIAGNOSIS — S72142S Displaced intertrochanteric fracture of left femur, sequela: Secondary | ICD-10-CM | POA: Diagnosis not present

## 2021-05-01 DIAGNOSIS — J441 Chronic obstructive pulmonary disease with (acute) exacerbation: Secondary | ICD-10-CM | POA: Diagnosis not present

## 2021-05-01 DIAGNOSIS — D649 Anemia, unspecified: Secondary | ICD-10-CM | POA: Diagnosis not present

## 2021-05-02 DIAGNOSIS — I10 Essential (primary) hypertension: Secondary | ICD-10-CM | POA: Diagnosis not present

## 2021-05-02 DIAGNOSIS — S72142S Displaced intertrochanteric fracture of left femur, sequela: Secondary | ICD-10-CM | POA: Diagnosis not present

## 2021-05-02 DIAGNOSIS — J441 Chronic obstructive pulmonary disease with (acute) exacerbation: Secondary | ICD-10-CM | POA: Diagnosis not present

## 2021-05-03 ENCOUNTER — Other Ambulatory Visit: Payer: Self-pay

## 2021-05-03 DIAGNOSIS — M1711 Unilateral primary osteoarthritis, right knee: Secondary | ICD-10-CM | POA: Insufficient documentation

## 2021-05-03 DIAGNOSIS — D649 Anemia, unspecified: Secondary | ICD-10-CM | POA: Insufficient documentation

## 2021-05-03 DIAGNOSIS — F32A Depression, unspecified: Secondary | ICD-10-CM | POA: Insufficient documentation

## 2021-05-03 DIAGNOSIS — J45901 Unspecified asthma with (acute) exacerbation: Secondary | ICD-10-CM | POA: Insufficient documentation

## 2021-05-03 DIAGNOSIS — K5904 Chronic idiopathic constipation: Secondary | ICD-10-CM | POA: Insufficient documentation

## 2021-05-03 DIAGNOSIS — R011 Cardiac murmur, unspecified: Secondary | ICD-10-CM | POA: Insufficient documentation

## 2021-05-03 DIAGNOSIS — I728 Aneurysm of other specified arteries: Secondary | ICD-10-CM | POA: Insufficient documentation

## 2021-05-03 DIAGNOSIS — T790XXA Air embolism (traumatic), initial encounter: Secondary | ICD-10-CM | POA: Insufficient documentation

## 2021-05-03 DIAGNOSIS — S72142S Displaced intertrochanteric fracture of left femur, sequela: Secondary | ICD-10-CM | POA: Diagnosis not present

## 2021-05-03 DIAGNOSIS — E440 Moderate protein-calorie malnutrition: Secondary | ICD-10-CM | POA: Insufficient documentation

## 2021-05-03 DIAGNOSIS — F419 Anxiety disorder, unspecified: Secondary | ICD-10-CM | POA: Insufficient documentation

## 2021-05-03 DIAGNOSIS — H269 Unspecified cataract: Secondary | ICD-10-CM | POA: Insufficient documentation

## 2021-05-03 DIAGNOSIS — Z7409 Other reduced mobility: Secondary | ICD-10-CM | POA: Insufficient documentation

## 2021-05-03 DIAGNOSIS — N2581 Secondary hyperparathyroidism of renal origin: Secondary | ICD-10-CM | POA: Insufficient documentation

## 2021-05-03 DIAGNOSIS — M4126 Other idiopathic scoliosis, lumbar region: Secondary | ICD-10-CM | POA: Insufficient documentation

## 2021-05-03 DIAGNOSIS — I10 Essential (primary) hypertension: Secondary | ICD-10-CM | POA: Insufficient documentation

## 2021-05-03 DIAGNOSIS — N183 Chronic kidney disease, stage 3 unspecified: Secondary | ICD-10-CM | POA: Insufficient documentation

## 2021-05-03 DIAGNOSIS — M419 Scoliosis, unspecified: Secondary | ICD-10-CM | POA: Insufficient documentation

## 2021-05-03 DIAGNOSIS — E785 Hyperlipidemia, unspecified: Secondary | ICD-10-CM | POA: Insufficient documentation

## 2021-05-03 DIAGNOSIS — M1712 Unilateral primary osteoarthritis, left knee: Secondary | ICD-10-CM | POA: Insufficient documentation

## 2021-05-03 DIAGNOSIS — M150 Primary generalized (osteo)arthritis: Secondary | ICD-10-CM | POA: Insufficient documentation

## 2021-05-03 DIAGNOSIS — R079 Chest pain, unspecified: Secondary | ICD-10-CM | POA: Insufficient documentation

## 2021-05-03 DIAGNOSIS — J45909 Unspecified asthma, uncomplicated: Secondary | ICD-10-CM | POA: Insufficient documentation

## 2021-05-03 DIAGNOSIS — M1990 Unspecified osteoarthritis, unspecified site: Secondary | ICD-10-CM | POA: Insufficient documentation

## 2021-05-03 DIAGNOSIS — D509 Iron deficiency anemia, unspecified: Secondary | ICD-10-CM | POA: Insufficient documentation

## 2021-05-03 DIAGNOSIS — F5101 Primary insomnia: Secondary | ICD-10-CM | POA: Insufficient documentation

## 2021-05-03 DIAGNOSIS — Z789 Other specified health status: Secondary | ICD-10-CM

## 2021-05-03 DIAGNOSIS — M5416 Radiculopathy, lumbar region: Secondary | ICD-10-CM | POA: Insufficient documentation

## 2021-05-03 DIAGNOSIS — G603 Idiopathic progressive neuropathy: Secondary | ICD-10-CM | POA: Insufficient documentation

## 2021-05-03 DIAGNOSIS — G8929 Other chronic pain: Secondary | ICD-10-CM | POA: Insufficient documentation

## 2021-05-03 DIAGNOSIS — I87393 Chronic venous hypertension (idiopathic) with other complications of bilateral lower extremity: Secondary | ICD-10-CM | POA: Insufficient documentation

## 2021-05-03 DIAGNOSIS — K219 Gastro-esophageal reflux disease without esophagitis: Secondary | ICD-10-CM | POA: Insufficient documentation

## 2021-05-03 HISTORY — DX: Other reduced mobility: Z74.09

## 2021-05-03 HISTORY — DX: Other specified health status: Z78.9

## 2021-05-04 ENCOUNTER — Encounter: Payer: Self-pay | Admitting: Cardiology

## 2021-05-04 ENCOUNTER — Ambulatory Visit (INDEPENDENT_AMBULATORY_CARE_PROVIDER_SITE_OTHER): Payer: Medicare Other | Admitting: Cardiology

## 2021-05-04 ENCOUNTER — Other Ambulatory Visit: Payer: Self-pay

## 2021-05-04 VITALS — BP 130/50 | HR 64 | Ht 59.0 in | Wt 77.8 lb

## 2021-05-04 DIAGNOSIS — I351 Nonrheumatic aortic (valve) insufficiency: Secondary | ICD-10-CM

## 2021-05-04 DIAGNOSIS — E782 Mixed hyperlipidemia: Secondary | ICD-10-CM

## 2021-05-04 DIAGNOSIS — I10 Essential (primary) hypertension: Secondary | ICD-10-CM | POA: Diagnosis not present

## 2021-05-04 DIAGNOSIS — I361 Nonrheumatic tricuspid (valve) insufficiency: Secondary | ICD-10-CM

## 2021-05-04 DIAGNOSIS — I471 Supraventricular tachycardia, unspecified: Secondary | ICD-10-CM

## 2021-05-04 HISTORY — DX: Supraventricular tachycardia, unspecified: I47.10

## 2021-05-04 HISTORY — DX: Nonrheumatic aortic (valve) insufficiency: I35.1

## 2021-05-04 HISTORY — DX: Nonrheumatic tricuspid (valve) insufficiency: I36.1

## 2021-05-04 HISTORY — DX: Supraventricular tachycardia: I47.1

## 2021-05-04 NOTE — Progress Notes (Signed)
Cardiology Office Note:    Date:  05/04/2021   ID:  Stephanie Clay, DOB 26-Mar-1932, MRN [id number]  PCP:  Blane Oharaox, Kirsten, MD  Cardiologist:  Thomasene RippleKardie Clementina Mareno, DO  Electrophysiologist:  None   Referring MD: Blane Oharaox, Kirsten, MD   Chief Complaint  Patient presents with   Hospitalization Follow-up    History of Present Illness:    Stephanie Clay is a 87 y.o. female with a hx of hypertension, CKD, asthma GERD, hyperlipidemia, DVT previously on Eliquis which was stopped to completion, moderate regurgitation, aortic regurgitation presents today posthospitalization.  The patient is here with her daughter reestablish cardiac care.  The hospitalization the patient was seen by Dr. Tomie Chinaevankar.  She tells me that she presented to the hospital because she was having palpitations and she was noted to be in SVT.  She was treated with AV nodal blockers and she converted to sinus rhythm.  She is here today for follow-up visit.  She offers no complaints at this time.  Past Medical History:  Diagnosis Date   Abdominal pain 01/27/2018   AKI (acute kidney injury) (HCC) 06/30/2016   Allergic rhinitis 08/20/2016   Anemia    Aneurysm of other specified arteries (HCC)    Anxiety    Arthritis    Asthma    Bronchiectasis with evidence of chronic atypical infection 01/03/2015   CT chest 12/2014:  Bronchiectasis throughout right lung, left clear.  +tree and bud, + patchy gg infiltrates in RLL Sputum 2016: no growth, AFB smear neg PFT's 02/2015:  Normal except mild decrease in DLCO   Bronchiectasis without acute exacerbation (HCC) 01/03/2015   CT chest 12/2014:  Bronchiectasis throughout right lung, left clear.  +tree and bud, + patchy gg infiltrates in RLL Sputum 2016: no growth, AFB smear neg PFT's 02/2015:  Normal except mild decrease in DLCO    Cataract    bilateral eye surgery   Chronic bilateral low back pain without sciatica 11/29/2019   Chronic chest pain    Chronic idiopathic constipation     Chronic kidney disease, stage 3 (HCC)    Chronic venous hypertension (idiopathic) with other complications of bilateral lower extremity    Decreased pedal pulses 03/24/2020   Depression    Dysphagia 03/01/2015   Essential hypertension 07/01/2016   Gastro-esophageal reflux    Heart murmur    Hyperglycemia 07/01/2016   Hyperlipemia    Hyperlipidemia 07/01/2016   Hypertension    Hypokalemia 07/01/2016   Hypomagnesemia 07/01/2016   Hypothyroidism 07/01/2016   Idiopathic progressive neuropathy    Iron (Fe) deficiency anemia    Joint pain 03/24/2020   Localized edema 11/26/2019   Mild vitamin D deficiency 11/29/2019   Moderate protein-calorie malnutrition (HCC)    Normocytic anemia 01/07/2020   Other fatigue 11/26/2019   Other idiopathic scoliosis, lumbar region    Pneumathemia (HCC)    Primary generalized (osteo)arthritis    Primary insomnia    Radiculopathy, lumbar region    Scoliosis    Secondary hyperparathyroidism (HCC)    Severe protein-calorie malnutrition (HCC) 03/24/2020   Underweight 07/02/2016   Unilateral primary osteoarthritis, left knee    Unilateral primary osteoarthritis, right knee     Past Surgical History:  Procedure Laterality Date   ABDOMINAL HYSTERECTOMY     CYSTOCELE REPAIR     SALPINGOOPHORECTOMY      Current Medications: Current Meds  Medication Sig   acetaminophen (TYLENOL) 325 MG tablet Take 650 mg by mouth every 6 (six) hours as needed  for mild pain.   albuterol (VENTOLIN HFA) 108 (90 Base) MCG/ACT inhaler Inhale 2 puffs into the lungs every 6 (six) hours as needed for wheezing or shortness of breath.   ALPRAZolam (XANAX) 0.5 MG tablet Take 0.5 mg by mouth every 12 (twelve) hours as needed for anxiety.   aspirin 325 MG tablet Take 325 mg by mouth 3 (three) times daily before meals.   Budeson-Glycopyrrol-Formoterol (BREZTRI AEROSPHERE) 160-9-4.8 MCG/ACT AERO Inhale 2 puffs into the lungs in the morning and at bedtime.   Cholecalciferol (VITAMIN D3 PO) Take 2,000  Units by mouth daily.   Cyanocobalamin 2500 MCG TABS Take 2,500 mcg by mouth daily.   dicyclomine (BENTYL) 10 MG capsule TAKE 1 CAPSULE BY MOUTH BEFORE EACH MEAL AND AT BEDTIME AS NEEDED FOR COLON SPASM   diltiazem (CARDIZEM) 30 MG tablet Take 30 mg by mouth every 8 (eight) hours.   fluticasone (FLONASE) 50 MCG/ACT nasal spray Place 2 sprays into both nostrils as needed for allergies or rhinitis.   gabapentin (NEURONTIN) 300 MG capsule Take 300 mg by mouth 2 (two) times daily.   levothyroxine (SYNTHROID) 50 MCG tablet TAKE 1 TABLET BY MOUTH DAILY   metoprolol succinate (TOPROL-XL) 25 MG 24 hr tablet Take 25 mg by mouth daily.   nitroGLYCERIN (NITROSTAT) 0.4 MG SL tablet Place 0.4 mg under the tongue every 5 (five) minutes as needed for chest pain. X 3 doses   nystatin (MYCOSTATIN) 100000 UNIT/ML suspension Take 5 mLs by mouth 3 (three) times daily.   ondansetron (ZOFRAN-ODT) 4 MG disintegrating tablet Take 4 mg by mouth as needed for nausea/vomiting.   oxyCODONE (OXY IR/ROXICODONE) 5 MG immediate release tablet Take 5 mg by mouth every 4 (four) hours as needed for pain or severe pain.   Oxycodone HCl 10 MG TABS Take 10 mg by mouth every 4 (four) hours as needed (pain).   pantoprazole (PROTONIX) 40 MG tablet TAKE 1 TABLET TWICE DAILY   polyethylene glycol (MIRALAX / GLYCOLAX) 17 g packet Take 17 g by mouth 2 (two) times daily.   potassium chloride SA (KLOR-CON) 20 MEQ tablet TAKE 3 TABLETS BY MOUTH ONCE DAILY.   sennosides-docusate sodium (SENOKOT-S) 8.6-50 MG tablet Take 1 tablet by mouth 2 (two) times daily.   simvastatin (ZOCOR) 10 MG tablet Take 10 mg by mouth daily. cholesterol   venlafaxine XR (EFFEXOR-XR) 150 MG 24 hr capsule TAKE 1 CAPSULE BY MOUTH IN THE MORNING     Allergies:   Lisinopril, Remeron [mirtazapine], Ciprofloxacin, Levofloxacin, Nitrofurantoin, Omnicef [cefdinir], and Sertraline hcl   Social History   Socioeconomic History   Marital status: Widowed    Spouse name: carl    Number of children: 3   Years of education: 9   Highest education level: Not on file  Occupational History   Occupation: retired  Tobacco Use   Smoking status: Never   Smokeless tobacco: Never  Vaping Use   Vaping Use: Never used  Substance and Sexual Activity   Alcohol use: No    Alcohol/week: 0.0 standard drinks   Drug use: No   Sexual activity: Not Currently  Other Topics Concern   Not on file  Social History Narrative   Right handed   Social Determinants of Health   Financial Resource Strain: Not on file  Food Insecurity: No Food Insecurity   Worried About Running Out of Food in the Last Year: Never true   Ran Out of Food in the Last Year: Never true  Transportation Needs: No Transportation  Needs   Lack of Transportation (Medical): No   Lack of Transportation (Non-Medical): No  Physical Activity: Not on file  Stress: Not on file  Social Connections: Not on file     Family History: The patient's family history includes Cancer in her sister; Diabetes in her daughter; Emphysema in her father; Heart disease in her mother and sister; Hyperlipidemia in her daughter and daughter; Hypertension in her daughter, daughter, and sister; Thyroid disease in her daughter and daughter; Varicose Veins in her sister. There is no history of Colon cancer, Esophageal cancer, or Stomach cancer.  ROS:   Review of Systems  Constitution: Negative for decreased appetite, fever and weight gain.  HENT: Negative for congestion, ear discharge, hoarse voice and sore throat.   Eyes: Negative for discharge, redness, vision loss in right eye and visual halos.  Cardiovascular: Negative for chest pain, dyspnea on exertion, leg swelling, orthopnea and palpitations.  Respiratory: Negative for cough, hemoptysis, shortness of breath and snoring.   Endocrine: Negative for heat intolerance and polyphagia.  Hematologic/Lymphatic: Negative for bleeding problem. Does not bruise/bleed easily.  Skin: Negative  for flushing, nail changes, rash and suspicious lesions.  Musculoskeletal: Negative for arthritis, joint pain, muscle cramps, myalgias, neck pain and stiffness.  Gastrointestinal: Negative for abdominal pain, bowel incontinence, diarrhea and excessive appetite.  Genitourinary: Negative for decreased libido, genital sores and incomplete emptying.  Neurological: Negative for brief paralysis, focal weakness, headaches and loss of balance.  Psychiatric/Behavioral: Negative for altered mental status, depression and suicidal ideas.  Allergic/Immunologic: Negative for HIV exposure and persistent infections.    EKGs/Labs/Other Studies Reviewed:    The following studies were reviewed today:   [facility]  [facility] bpm  Echocardiogram done at [facility] on April 24, 2021 shows LVEF 55 to 60%.  Grade 1 diastolic dysfunction.  Mild aortic valve sclerosis.  Moderate regurgitation.  Trace to mild mitral vegetation.  Mild to moderate trackers regurgitation.  Right vertical systolic pressure 46 mmHg.  Recent Labs: 10/03/2020: TSH 4.370 03/16/2021: ALT 9; BUN 40; Creatinine, Ser 1.12; Hemoglobin 11.1; Platelets 269; Potassium 3.9; Sodium 143  Recent Lipid Panel    Component Value Date/Time   CHOL 160 03/16/2021 1213   TRIG 75 03/16/2021 1213   HDL 76 03/16/2021 1213   CHOLHDL 2.1 03/16/2021 1213   LDLCALC 70 03/16/2021 1213    Physical Exam:    VS:  BP (!) 130/50 (BP Location: Left Arm, Patient Position: Sitting, Cuff Size: Small)   Pulse 64   Ht  (1.499 m)   Wt 77 lb 12.8 oz (35.3 kg)   SpO2 94%   BMI 15.71 kg/m     Wt Readings from Last 3 Encounters:  05/04/21 77 lb 12.8 oz (35.3 kg)  03/16/21 73 lb 9.6 oz (33.4 kg)  02/24/21 77 lb (34.9 kg)     GEN: Well nourished, well developed in no acute distress HEENT: Normal NECK: No JVD; No carotid bruits LYMPHATICS: No lymphadenopathy CARDIAC: S1S2 noted,RRR, no murmurs, rubs,  gallops RESPIRATORY:  Clear to auscultation without rales, wheezing or rhonchi  ABDOMEN: Soft, non-tender, non-distended, +bowel sounds, no guarding. EXTREMITIES: No edema, No cyanosis, no clubbing MUSCULOSKELETAL:  No deformity  SKIN: Warm and dry NEUROLOGIC:  Alert and oriented x 3, non-focal PSYCHIATRIC:  Normal affect, good insight  ASSESSMENT:    1. SVT (supraventricular tachycardia) (HCC)   2. Hypertension, unspecified type   3. Mixed hyperlipidemia   4. Nonrheumatic aortic valve  insufficiency   5. Nonrheumatic tricuspid valve regurgitation    PLAN:     She is in sinus rhythm today.  She is on Cardizem 30 mg every 8 hours as well as milligrams of metoprolol.  Heart rate today's is in the 60s.  We will continue to monitor this.  Her blood pressure is acceptable.  Hyperlipidemia-continue patient on her simvastatin.  No signs of heart failure.  The patient is in agreement with the above plan. The patient left the office in stable condition.  The patient will follow up in 6 months.   Medication Adjustments/Labs and Tests Ordered: Current medicines are reviewed at length with the patient today.  Concerns regarding medicines are outlined above.  Orders Placed This Encounter  Procedures   EKG 12-Lead   No orders of the defined types were placed in this encounter.   Patient Instructions  Medication Instructions:  Your physician recommends that you continue on your current medications as directed. Please refer to the Current Medication list given to you today.  *If you need a refill on your cardiac medications before your next appointment, please call your pharmacy*   Lab Work: NONE If you have labs (blood work) drawn today and your tests are completely normal, you will receive your results only by: MyChart Message (if you have MyChart) OR A paper copy in the mail If you have any lab test that is abnormal or we need to change your treatment, we will call you to review  the results.   Testing/Procedures: NONE   Follow-Up: At [facility], you and your health needs are our priority.  As part of our continuing mission to provide you with exceptional heart care, we have created designated Provider Care Teams.  These Care Teams include your primary Cardiologist (physician) and Advanced Practice Providers (APPs -  Physician Assistants and Nurse Practitioners) who all work together to provide you with the care you need, when you need it.  We recommend signing up for the patient portal called "MyChart".  Sign up information is provided on this After Visit Summary.  MyChart is used to connect with patients for Virtual Visits (Telemedicine).  Patients are able to view lab/test results, encounter notes, upcoming appointments, etc.  Non-urgent messages can be sent to your provider as well.   To learn more about what you can do with MyChart, go to [email or web address].    Your next appointment:   6 month(s)  The format for your next appointment:   In Person  Provider:   Belva Crome, MD   Other Instructions    Adopting a Healthy Lifestyle.  Know what a healthy weight is for you (roughly BMI <25) and aim to maintain this   Aim for 7+ servings of fruits and vegetables daily   65-80+ fluid ounces of water or unsweet tea for healthy kidneys   Limit to max 1 drink of alcohol per day; avoid smoking/tobacco   Limit animal fats in diet for cholesterol and heart health - choose grass fed whenever available   Avoid highly processed foods, and foods high in saturated/trans fats   Aim for low stress - take time to unwind and care for your mental health   Aim for 150 min of moderate intensity exercise weekly for heart health, and weights twice weekly for bone health   Aim for 7-9 hours of sleep daily   When it comes to diets, agreement about the perfect plan isnt easy to find, even among the experts. Experts at  the [employer]  developed an idea known as the Healthy Eating Plate. Just imagine a plate divided into logical, healthy portions.   The emphasis is on diet quality:   Load up on vegetables and fruits - one-half of your plate: Aim for color and variety, and remember that potatoes dont count.   Go for whole grains - one-quarter of your plate: Whole wheat, barley, wheat berries, quinoa, oats, brown rice, and foods made with them. If you want pasta, go with whole wheat pasta.   Protein power - one-quarter of your plate: Fish, chicken, beans, and nuts are all healthy, versatile protein sources. Limit red meat.   The diet, however, does go beyond the plate, offering a few other suggestions.   Use healthy plant oils, such as olive, canola, soy, corn, sunflower and peanut. Check the labels, and avoid partially hydrogenated oil, which have unhealthy trans fats.   If youre thirsty, drink water. Coffee and tea are good in moderation, but skip sugary drinks and limit milk and dairy products to one or two daily servings.   The type of carbohydrate in the diet is more important than the amount. Some sources of carbohydrates, such as vegetables, fruits, whole grains, and beans-are healthier than others.   Finally, stay active  Signed, Thomasene Ripple, DO  05/04/2021 8:19 PM    [facility]

## 2021-05-04 NOTE — Patient Instructions (Signed)
Medication Instructions:  °Your physician recommends that you continue on your current medications as directed. Please refer to the Current Medication list given to you today. ° °*If you need a refill on your cardiac medications before your next appointment, please call your pharmacy* ° ° °Lab Work: °NONE °If you have labs (blood work) drawn today and your tests are completely normal, you will receive your results only by: °MyChart Message (if you have MyChart) OR °A paper copy in the mail °If you have any lab test that is abnormal or we need to change your treatment, we will call you to review the results. ° ° °Testing/Procedures: °NONE ° ° °Follow-Up: °At [facility], you and your health needs are our priority.  As part of our continuing mission to provide you with exceptional heart care, we have created designated Provider Care Teams.  These Care Teams include your primary Cardiologist (physician) and Advanced Practice Providers (APPs -  Physician Assistants and Nurse Practitioners) who all work together to provide you with the care you need, when you need it. ° °We recommend signing up for the patient portal called "MyChart".  Sign up information is provided on this After Visit Summary.  MyChart is used to connect with patients for Virtual Visits (Telemedicine).  Patients are able to view lab/test results, encounter notes, upcoming appointments, etc.  Non-urgent messages can be sent to your provider as well.   °To learn more about what you can do with MyChart, go to [email or web address].   ° °Your next appointment:   °6 month(s) ° °The format for your next appointment:   °In Person ° °Provider:   °Rajan Revankar, MD  ° ° °Other Instructions °  °

## 2021-05-08 DIAGNOSIS — I471 Supraventricular tachycardia: Secondary | ICD-10-CM | POA: Diagnosis not present

## 2021-05-08 DIAGNOSIS — S72142S Displaced intertrochanteric fracture of left femur, sequela: Secondary | ICD-10-CM | POA: Diagnosis not present

## 2021-05-11 DIAGNOSIS — S72142S Displaced intertrochanteric fracture of left femur, sequela: Secondary | ICD-10-CM | POA: Diagnosis not present

## 2021-05-11 DIAGNOSIS — I471 Supraventricular tachycardia: Secondary | ICD-10-CM | POA: Diagnosis not present

## 2021-05-15 ENCOUNTER — Telehealth: Payer: Self-pay

## 2021-05-15 DIAGNOSIS — I129 Hypertensive chronic kidney disease with stage 1 through stage 4 chronic kidney disease, or unspecified chronic kidney disease: Secondary | ICD-10-CM | POA: Diagnosis not present

## 2021-05-15 DIAGNOSIS — N183 Chronic kidney disease, stage 3 unspecified: Secondary | ICD-10-CM | POA: Diagnosis not present

## 2021-05-15 DIAGNOSIS — Z86718 Personal history of other venous thrombosis and embolism: Secondary | ICD-10-CM | POA: Diagnosis not present

## 2021-05-15 DIAGNOSIS — S72142D Displaced intertrochanteric fracture of left femur, subsequent encounter for closed fracture with routine healing: Secondary | ICD-10-CM | POA: Diagnosis not present

## 2021-05-15 DIAGNOSIS — I471 Supraventricular tachycardia: Secondary | ICD-10-CM | POA: Diagnosis not present

## 2021-05-15 DIAGNOSIS — A310 Pulmonary mycobacterial infection: Secondary | ICD-10-CM | POA: Diagnosis not present

## 2021-05-15 DIAGNOSIS — E875 Hyperkalemia: Secondary | ICD-10-CM | POA: Diagnosis not present

## 2021-05-15 DIAGNOSIS — Z9181 History of falling: Secondary | ICD-10-CM | POA: Diagnosis not present

## 2021-05-15 DIAGNOSIS — K5909 Other constipation: Secondary | ICD-10-CM | POA: Diagnosis not present

## 2021-05-15 DIAGNOSIS — E039 Hypothyroidism, unspecified: Secondary | ICD-10-CM | POA: Diagnosis not present

## 2021-05-15 DIAGNOSIS — K219 Gastro-esophageal reflux disease without esophagitis: Secondary | ICD-10-CM | POA: Diagnosis not present

## 2021-05-15 DIAGNOSIS — S72142S Displaced intertrochanteric fracture of left femur, sequela: Secondary | ICD-10-CM | POA: Diagnosis not present

## 2021-05-15 DIAGNOSIS — F32A Depression, unspecified: Secondary | ICD-10-CM | POA: Diagnosis not present

## 2021-05-15 DIAGNOSIS — M419 Scoliosis, unspecified: Secondary | ICD-10-CM | POA: Diagnosis not present

## 2021-05-15 DIAGNOSIS — E876 Hypokalemia: Secondary | ICD-10-CM | POA: Diagnosis not present

## 2021-05-15 DIAGNOSIS — D62 Acute posthemorrhagic anemia: Secondary | ICD-10-CM | POA: Diagnosis not present

## 2021-05-15 DIAGNOSIS — J449 Chronic obstructive pulmonary disease, unspecified: Secondary | ICD-10-CM | POA: Diagnosis not present

## 2021-05-15 NOTE — Telephone Encounter (Signed)
Pt calling due to having night sweats. States it has been happening for 2-3 weeks. When she wakes up she is drenched. Pt is questioning what she can do. Denies fever.  Lorita Officer, [place] 05/15/21 10:17 AM

## 2021-05-15 NOTE — Telephone Encounter (Signed)
Will need to be seen lp

## 2021-05-15 NOTE — Telephone Encounter (Signed)
Tiffany w/ [facility] calling for verbal orders for nursing. Requests twice a week for one week then once a week for four weeks. Gave verbal orders.   [name] 05/15/21 2:39 PM

## 2021-05-15 NOTE — Telephone Encounter (Signed)
Made appointment.   Stephanie Clay 05/15/21 4:32 PM

## 2021-05-16 ENCOUNTER — Encounter: Payer: Self-pay | Admitting: Legal Medicine

## 2021-05-16 ENCOUNTER — Other Ambulatory Visit: Payer: Self-pay

## 2021-05-16 ENCOUNTER — Ambulatory Visit (INDEPENDENT_AMBULATORY_CARE_PROVIDER_SITE_OTHER): Payer: Medicare Other | Admitting: Legal Medicine

## 2021-05-16 VITALS — BP 160/80 | HR 60 | Temp 97.2°F | Ht 59.0 in | Wt 74.2 lb

## 2021-05-16 DIAGNOSIS — I10 Essential (primary) hypertension: Secondary | ICD-10-CM

## 2021-05-16 DIAGNOSIS — J479 Bronchiectasis, uncomplicated: Secondary | ICD-10-CM

## 2021-05-16 DIAGNOSIS — R61 Generalized hyperhidrosis: Secondary | ICD-10-CM

## 2021-05-16 DIAGNOSIS — E038 Other specified hypothyroidism: Secondary | ICD-10-CM | POA: Diagnosis not present

## 2021-05-16 DIAGNOSIS — I824Y2 Acute embolism and thrombosis of unspecified deep veins of left proximal lower extremity: Secondary | ICD-10-CM

## 2021-05-16 DIAGNOSIS — E782 Mixed hyperlipidemia: Secondary | ICD-10-CM

## 2021-05-16 DIAGNOSIS — B370 Candidal stomatitis: Secondary | ICD-10-CM | POA: Diagnosis not present

## 2021-05-16 LAB — POCT URINALYSIS DIP (CLINITEK)
Bilirubin, UA: NEGATIVE
Glucose, UA: NEGATIVE mg/dL
Ketones, POC UA: NEGATIVE mg/dL
Nitrite, UA: NEGATIVE
POC PROTEIN,UA: 30 — AB
Spec Grav, UA: 1.025 (ref 1.010–1.025)
Urobilinogen, UA: 0.2 E.U./dL
pH, UA: 6 (ref 5.0–8.0)

## 2021-05-16 MED ORDER — NYSTATIN 100000 UNIT/ML MT SUSP: 5.0000 mL | Freq: Three times a day (TID) | OROMUCOSAL | 3 refills | Status: DC

## 2021-05-16 NOTE — Progress Notes (Signed)
Established Patient Office Visit  Subjective:  Patient ID: Stephanie Clay, female    DOB: Feb 03, 1932  Age: 87 y.o. MRN: [id number]  CC:  Chief Complaint  Patient presents with   Night Sweats    HPI Stephanie Clay presents for night sweats. For one months. Lost 4 lbs since hip fracture. No TB exposure. She had elevated WBC in may. Eating well. She is just home from rehabilitation for fractured hip and she is in wheel chair. She is having regular BM Patient has convoluted history with recent left hip fracture and pinning. She is in whee chair at present. Past Medical History:  Diagnosis Date   Abdominal pain 01/27/2018   AKI (acute kidney injury) ([place]) 06/30/2016   Allergic rhinitis 08/20/2016   Anemia    Aneurysm of other specified arteries ([place])    Anxiety    Arthritis    Asthma    Bronchiectasis with evidence of chronic atypical infection 01/03/2015   CT chest 12/2014:  Bronchiectasis throughout right lung, left clear.  +tree and bud, + patchy gg infiltrates in RLL Sputum 2016: no growth, AFB smear neg PFT's 02/2015:  Normal except mild decrease in DLCO   Bronchiectasis without acute exacerbation ([place]) 01/03/2015   CT chest 12/2014:  Bronchiectasis throughout right lung, left clear.  +tree and bud, + patchy gg infiltrates in RLL Sputum 2016: no growth, AFB smear neg PFT's 02/2015:  Normal except mild decrease in DLCO    Cataract    bilateral eye surgery   Chronic bilateral low back pain without sciatica 11/29/2019   Chronic chest pain    Chronic idiopathic constipation    Chronic kidney disease, stage 3 (HCC)    Chronic venous hypertension (idiopathic) with other complications of bilateral lower extremity    Decreased pedal pulses 03/24/2020   Depression    Dysphagia 03/01/2015   Essential hypertension 07/01/2016   Gastro-esophageal reflux    Heart murmur    Hyperglycemia 07/01/2016   Hyperlipemia    Hyperlipidemia 07/01/2016   Hypertension    Hypokalemia  07/01/2016   Hypomagnesemia 07/01/2016   Hypothyroidism 07/01/2016   Idiopathic progressive neuropathy    Iron (Fe) deficiency anemia    Joint pain 03/24/2020   Localized edema 11/26/2019   Mild vitamin D deficiency 11/29/2019   Moderate protein-calorie malnutrition (HCC)    Normocytic anemia 01/07/2020   Other fatigue 11/26/2019   Other idiopathic scoliosis, lumbar region    Pneumathemia ([place])    Primary generalized (osteo)arthritis    Primary insomnia    Radiculopathy, lumbar region    Scoliosis    Secondary hyperparathyroidism ([place])    Severe protein-calorie malnutrition ([place]) 03/24/2020   Underweight 07/02/2016   Unilateral primary osteoarthritis, left knee    Unilateral primary osteoarthritis, right knee     Past Surgical History:  Procedure Laterality Date   ABDOMINAL HYSTERECTOMY     CYSTOCELE REPAIR     SALPINGOOPHORECTOMY      Family History  Problem Relation Age of Onset   Emphysema Father    Heart disease Mother    Cancer Sister        metastatic lung cancer   Heart disease Sister    Hypertension Sister    Varicose Veins Sister    Hyperlipidemia Daughter    Hypertension Daughter    Diabetes Daughter    Hyperlipidemia Daughter    Hypertension Daughter    Thyroid disease Daughter    Thyroid disease Daughter    Colon cancer  Neg Hx    Esophageal cancer Neg Hx    Stomach cancer Neg Hx     Social History   Socioeconomic History   Marital status: Widowed    Spouse name: carl   Number of children: 3   Years of education: 9   Highest education level: Not on file  Occupational History   Occupation: retired  Tobacco Use   Smoking status: Never   Smokeless tobacco: Never  Vaping Use   Vaping Use: Never used  Substance and Sexual Activity   Alcohol use: No    Alcohol/week: 0.0 standard drinks   Drug use: No   Sexual activity: Not Currently  Other Topics Concern   Not on file  Social History Narrative   Right handed   Social Determinants of Health    Financial Resource Strain: Not on file  Food Insecurity: No Food Insecurity   Worried About Running Out of Food in the Last Year: Never true   Benjamin Perez in the Last Year: Never true  Transportation Needs: No Transportation Needs   Lack of Transportation (Medical): No   Lack of Transportation (Non-Medical): No  Physical Activity: Not on file  Stress: Not on file  Social Connections: Not on file  Intimate Partner Violence: Not on file    Outpatient Medications Prior to Visit  Medication Sig Dispense Refill   acetaminophen (TYLENOL) 325 MG tablet Take 650 mg by mouth every 6 (six) hours as needed for mild pain.     albuterol (VENTOLIN HFA) 108 (90 Base) MCG/ACT inhaler Inhale 2 puffs into the lungs every 6 (six) hours as needed for wheezing or shortness of breath. 8 g 6   ALPRAZolam (XANAX) 0.5 MG tablet Take 0.5 mg by mouth every 12 (twelve) hours as needed for anxiety.     aspirin 325 MG tablet Take 325 mg by mouth 3 (three) times daily before meals.     Budeson-Glycopyrrol-Formoterol (BREZTRI AEROSPHERE) 160-9-4.8 MCG/ACT AERO Inhale 2 puffs into the lungs in the morning and at bedtime. 10.7 g 3   Cholecalciferol (VITAMIN D3 PO) Take 2,000 Units by mouth daily.     Cyanocobalamin 2500 MCG TABS Take 2,500 mcg by mouth daily.     dicyclomine (BENTYL) 10 MG capsule TAKE 1 CAPSULE BY MOUTH BEFORE EACH MEAL AND AT BEDTIME AS NEEDED FOR COLON SPASM 120 capsule 0   diltiazem (CARDIZEM) 30 MG tablet Take 30 mg by mouth every 8 (eight) hours.     fluticasone (FLONASE) 50 MCG/ACT nasal spray Place 2 sprays into both nostrils as needed for allergies or rhinitis.     gabapentin (NEURONTIN) 300 MG capsule Take 300 mg by mouth 2 (two) times daily.     levothyroxine (SYNTHROID) 50 MCG tablet TAKE 1 TABLET BY MOUTH DAILY 90 tablet 0   metoprolol succinate (TOPROL-XL) 25 MG 24 hr tablet Take 25 mg by mouth daily.     nitroGLYCERIN (NITROSTAT) 0.4 MG SL tablet Place 0.4 mg under the tongue every  5 (five) minutes as needed for chest pain. X 3 doses     ondansetron (ZOFRAN-ODT) 4 MG disintegrating tablet Take 4 mg by mouth as needed for nausea/vomiting.     oxyCODONE (OXY IR/ROXICODONE) 5 MG immediate release tablet Take 5 mg by mouth every 4 (four) hours as needed for pain or severe pain.     Oxycodone HCl 10 MG TABS Take 10 mg by mouth every 4 (four) hours as needed (pain).     pantoprazole (PROTONIX)  40 MG tablet TAKE 1 TABLET TWICE DAILY 180 tablet 1   polyethylene glycol (MIRALAX / GLYCOLAX) 17 g packet Take 17 g by mouth 2 (two) times daily.     potassium chloride SA (KLOR-CON) 20 MEQ tablet TAKE 3 TABLETS BY MOUTH ONCE DAILY. 90 tablet 2   sennosides-docusate sodium (SENOKOT-S) 8.6-50 MG tablet Take 1 tablet by mouth 2 (two) times daily.     simvastatin (ZOCOR) 10 MG tablet Take 10 mg by mouth daily. cholesterol     venlafaxine XR (EFFEXOR-XR) 150 MG 24 hr capsule TAKE 1 CAPSULE BY MOUTH IN THE MORNING 30 capsule 0   nystatin (MYCOSTATIN) 100000 UNIT/ML suspension Take 5 mLs by mouth 3 (three) times daily.     No facility-administered medications prior to visit.    Allergies  Allergen Reactions   Lisinopril Other (See Comments)   Remeron [Mirtazapine] Other (See Comments)   Ciprofloxacin     unknown   Levofloxacin     unknonw   Nitrofurantoin     unknown   Omnicef [Cefdinir] Diarrhea   Sertraline Hcl Other (See Comments)    Tremors    ROS Review of Systems  Constitutional:  Positive for fatigue. Negative for activity change, appetite change and fever.  HENT:  Negative for congestion.   Eyes:  Negative for visual disturbance.  Respiratory:  Negative for cough, chest tightness and shortness of breath.   Cardiovascular:  Negative for chest pain, palpitations and leg swelling.  Gastrointestinal:  Negative for abdominal distention and abdominal pain.  Endocrine: Negative for polyuria.  Genitourinary:  Negative for difficulty urinating.  Musculoskeletal:  Negative for  arthralgias and back pain.  Skin: Negative.   Neurological: Negative.   Psychiatric/Behavioral: Negative.       Objective:    Physical Exam Vitals reviewed.  Constitutional:      General: She is not in acute distress.    Appearance: Normal appearance.  HENT:     Head: Normocephalic.     Right Ear: Tympanic membrane, ear canal and external ear normal.     Left Ear: Tympanic membrane, ear canal and external ear normal.     Nose: Nose normal.     Mouth/Throat:     Mouth: Mucous membranes are moist.     Pharynx: Oropharynx is clear.     Comments: Very poor dentition and coated tongue Eyes:     Extraocular Movements: Extraocular movements intact.     Conjunctiva/sclera: Conjunctivae normal.     Pupils: Pupils are equal, round, and reactive to light.  Cardiovascular:     Rate and Rhythm: Normal rate and regular rhythm.     Pulses: Normal pulses.     Heart sounds: Normal heart sounds. No murmur heard.   No gallop.  Pulmonary:     Effort: Pulmonary effort is normal. No respiratory distress.     Breath sounds: Normal breath sounds. No wheezing.  Abdominal:     General: Abdomen is flat. Bowel sounds are normal. There is no distension.     Palpations: Abdomen is soft.     Tenderness: There is no abdominal tenderness.  Musculoskeletal:        General: Normal range of motion.     Cervical back: Normal range of motion and neck supple.     Right lower leg: No edema.     Left lower leg: No edema.     Comments: Weak left leg with pain proximal area , history of DVT  Skin:    General:  Skin is warm and dry.     Capillary Refill: Capillary refill takes less than 2 seconds.  Neurological:     General: No focal deficit present.     Mental Status: She is alert and oriented to person, place, and time.     Motor: Weakness present.  Psychiatric:        Mood and Affect: Mood normal.        Thought Content: Thought content normal.        Judgment: Judgment normal.    BP (!) 160/80    Pulse 60   Temp (!) 97.2 F (36.2 C)   Ht _0  (1.499 m)   Wt 74 lb 3.2 oz (33.7 kg)   BMI 14.99 kg/m  Wt Readings from Last 3 Encounters:  05/16/21 74 lb 3.2 oz (33.7 kg)  05/04/21 77 lb 12.8 oz (35.3 kg)  03/16/21 73 lb 9.6 oz (33.4 kg)     Health Maintenance Due  Topic Date Due   COVID-19 Vaccine (1) Never done   Zoster Vaccines- Shingrix (1 of 2) Never done    There are no preventive care reminders to display for this patient.  Lab Results  Component Value Date   TSH 4.370 10/03/2020   Lab Results  Component Value Date   WBC 13.4 (H) 03/16/2021   HGB 11.1 03/16/2021   HCT 33.6 (L) 03/16/2021   MCV 93 03/16/2021   PLT 269 03/16/2021   Lab Results  Component Value Date   NA 143 03/16/2021   K 3.9 03/16/2021   CO2 31 (H) 03/16/2021   GLUCOSE 92 03/16/2021   BUN 40 (H) 03/16/2021   CREATININE 1.12 (H) 03/16/2021   BILITOT 0.3 03/16/2021   ALKPHOS 74 03/16/2021   AST 19 03/16/2021   ALT 9 03/16/2021   PROT 7.9 03/16/2021   ALBUMIN 4.4 03/16/2021   CALCIUM 10.3 03/16/2021   ANIONGAP 10 07/07/2016   EGFR 48 (L) 03/16/2021   GFR 48.24 (L) 04/07/2018   Lab Results  Component Value Date   CHOL 160 03/16/2021   Lab Results  Component Value Date   HDL 76 03/16/2021   Lab Results  Component Value Date   LDLCALC 70 03/16/2021   Lab Results  Component Value Date   TRIG 75 03/16/2021   Lab Results  Component Value Date   CHOLHDL 2.1 03/16/2021   Lab Results  Component Value Date   HGBA1C 5.5 03/08/2020      Assessment & Plan:   Diagnoses and all orders for this visit: Candidal stomatitis -     nystatin (MYCOSTATIN) 100000 UNIT/ML suspension; Take 5 mLs (500,000 Units total) by mouth 3 (three) times daily. -     Urine Culture Patient has poor dentition and candida on tongue Other specified hypothyroidism -     TSH -     Urine Culture Patient is known to have hypothyroid and is n treatment with levothyroxine 56mg.  Patient was diagnosed 10  years ago.  Other treatment includes none.  Patient is compliant with medicines and last TSH 6 months ago.  Last TSH was norma .  Mixed hyperlipidemia -     Lipid panel -     Urine Culture AN INDIVIDUAL CARE PLAN for hyperlipidemia/ cholesterol was established and reinforced today.  The patient's status was assessed using clinical findings on exam, lab and other diagnostic tests. The patient's disease status was assessed based on evidence-based guidelines and found to be fair controlled. MEDICATIONS were reviewed. SELF  MANAGEMENT GOALS have been discussed and patient's success at attaining the goal of low cholesterol was assessed. RECOMMENDATION given include regular exercise 3 days a week and low cholesterol/low fat diet. CLINICAL SUMMARY including written plan to identify barriers unique to the patient due to social or economic  reasons was discussed.   Essential hypertension -     CBC with Differential/Platelet -     Comprehensive metabolic panel -     Urine Culture An individual hypertension care plan was established and reinforced today.  The patient's status was assessed using clinical findings on exam and labs or diagnostic tests. The patient's success at meeting treatment goals on disease specific evidence-based guidelines and found to be well controlled. SELF MANAGEMENT: The patient and I together assessed ways to personally work towards obtaining the recommended goals. RECOMMENDATIONS: avoid decongestants found in common cold remedies, decrease consumption of alcohol, perform routine monitoring of BP with home BP cuff, exercise, reduction of dietary salt, take medicines as prescribed, try not to miss doses and quit smoking.  Regular exercise and maintaining a healthy weight is needed.  Stress reduction may help. A CLINICAL SUMMARY including written plan identify barriers to care unique to individual due to social or financial issues.  We attempt to mutually creat solutions for individual  and family understanding.   Night sweats -     C-reactive protein -     POCT URINALYSIS DIP (CLINITEK) -     ECHOCARDIOGRAM COMPLETE; Future -     QuantiFERON-TB Gold Plus -     Urine Culture Confusing history of night sweats, she needs complete workup  Bronchiectasis without complication (HCC) -     DG Chest 2 View -     Urine Culture Stable bronchiectasis no evidence for infection  Acute deep vein thrombosis (DVT) of proximal vein of left lower extremity (HCC) -     US Venous Img Lower Unilateral Left -     Urine Culture  DVT is possible cause of night sweats  30 minute visit with review of records  Follow-up: Return in about 2 weeks (around 05/30/2021) for night sweats.    Reinaldo Meeker, MD

## 2021-05-17 ENCOUNTER — Telehealth: Payer: Self-pay | Admitting: Family Medicine

## 2021-05-17 ENCOUNTER — Telehealth: Payer: Self-pay | Admitting: Emergency Medicine

## 2021-05-17 DIAGNOSIS — K219 Gastro-esophageal reflux disease without esophagitis: Secondary | ICD-10-CM | POA: Diagnosis not present

## 2021-05-17 DIAGNOSIS — E039 Hypothyroidism, unspecified: Secondary | ICD-10-CM | POA: Diagnosis not present

## 2021-05-17 DIAGNOSIS — I129 Hypertensive chronic kidney disease with stage 1 through stage 4 chronic kidney disease, or unspecified chronic kidney disease: Secondary | ICD-10-CM | POA: Diagnosis not present

## 2021-05-17 DIAGNOSIS — J449 Chronic obstructive pulmonary disease, unspecified: Secondary | ICD-10-CM | POA: Diagnosis not present

## 2021-05-17 DIAGNOSIS — Z9181 History of falling: Secondary | ICD-10-CM | POA: Diagnosis not present

## 2021-05-17 DIAGNOSIS — A310 Pulmonary mycobacterial infection: Secondary | ICD-10-CM | POA: Diagnosis not present

## 2021-05-17 DIAGNOSIS — N183 Chronic kidney disease, stage 3 unspecified: Secondary | ICD-10-CM | POA: Diagnosis not present

## 2021-05-17 DIAGNOSIS — K5909 Other constipation: Secondary | ICD-10-CM | POA: Diagnosis not present

## 2021-05-17 DIAGNOSIS — F32A Depression, unspecified: Secondary | ICD-10-CM | POA: Diagnosis not present

## 2021-05-17 DIAGNOSIS — D62 Acute posthemorrhagic anemia: Secondary | ICD-10-CM | POA: Diagnosis not present

## 2021-05-17 DIAGNOSIS — M419 Scoliosis, unspecified: Secondary | ICD-10-CM | POA: Diagnosis not present

## 2021-05-17 DIAGNOSIS — Z86718 Personal history of other venous thrombosis and embolism: Secondary | ICD-10-CM | POA: Diagnosis not present

## 2021-05-17 DIAGNOSIS — S72142D Displaced intertrochanteric fracture of left femur, subsequent encounter for closed fracture with routine healing: Secondary | ICD-10-CM | POA: Diagnosis not present

## 2021-05-17 DIAGNOSIS — E875 Hyperkalemia: Secondary | ICD-10-CM | POA: Diagnosis not present

## 2021-05-17 DIAGNOSIS — I471 Supraventricular tachycardia: Secondary | ICD-10-CM | POA: Diagnosis not present

## 2021-05-17 DIAGNOSIS — E876 Hypokalemia: Secondary | ICD-10-CM | POA: Diagnosis not present

## 2021-05-17 LAB — CBC WITH DIFFERENTIAL/PLATELET
Basophils Absolute: 0.1 10*3/uL (ref 0.0–0.2)
Basos: 1 %
EOS (ABSOLUTE): 0.3 10*3/uL (ref 0.0–0.4)
Eos: 3 %
Hematocrit: 32.5 % — ABNORMAL LOW (ref 34.0–46.6)
Hemoglobin: 10.7 g/dL — ABNORMAL LOW (ref 11.1–15.9)
Immature Grans (Abs): 0 10*3/uL (ref 0.0–0.1)
Immature Granulocytes: 0 %
Lymphocytes Absolute: 1.3 10*3/uL (ref 0.7–3.1)
Lymphs: 14 %
MCH: 30.6 pg (ref 26.6–33.0)
MCHC: 32.9 g/dL (ref 31.5–35.7)
MCV: 93 fL (ref 79–97)
Monocytes Absolute: 0.8 10*3/uL (ref 0.1–0.9)
Monocytes: 8 %
Neutrophils Absolute: 7.3 10*3/uL — ABNORMAL HIGH (ref 1.4–7.0)
Neutrophils: 74 %
Platelets: 295 10*3/uL (ref 150–450)
RBC: 3.5 x10E6/uL — ABNORMAL LOW (ref 3.77–5.28)
RDW: 12.6 % (ref 11.7–15.4)
WBC: 9.9 10*3/uL (ref 3.4–10.8)

## 2021-05-17 LAB — LIPID PANEL
Chol/HDL Ratio: 2.6 ratio (ref 0.0–4.4)
Cholesterol, Total: 185 mg/dL (ref 100–199)
HDL: 71 mg/dL (ref >39)
LDL Chol Calc (NIH): 99 mg/dL (ref 0–99)
Triglycerides: 83 mg/dL (ref 0–149)
VLDL Cholesterol Cal: 15 mg/dL (ref 5–40)

## 2021-05-17 LAB — COMPREHENSIVE METABOLIC PANEL
ALT: 6 IU/L (ref 0–32)
AST: 16 IU/L (ref 0–40)
Albumin/Globulin Ratio: 1.3 (ref 1.2–2.2)
Albumin: 4 g/dL (ref 3.6–4.6)
Alkaline Phosphatase: 148 IU/L — ABNORMAL HIGH (ref 44–121)
BUN/Creatinine Ratio: 25 (ref 12–28)
BUN: 18 mg/dL (ref 8–27)
Bilirubin Total: 0.3 mg/dL (ref 0.0–1.2)
CO2: 27 mmol/L (ref 20–29)
Calcium: 10.3 mg/dL (ref 8.7–10.3)
Chloride: 105 mmol/L (ref 96–106)
Creatinine, Ser: 0.71 mg/dL (ref 0.57–1.00)
Globulin, Total: 3 g/dL (ref 1.5–4.5)
Glucose: 88 mg/dL (ref 65–99)
Potassium: 5.3 mmol/L — ABNORMAL HIGH (ref 3.5–5.2)
Sodium: 144 mmol/L (ref 134–144)
Total Protein: 7 g/dL (ref 6.0–8.5)
eGFR: 82 mL/min/{1.73_m2} (ref >59)

## 2021-05-17 LAB — CARDIOVASCULAR RISK ASSESSMENT

## 2021-05-17 LAB — C-REACTIVE PROTEIN: CRP: 29 mg/L — ABNORMAL HIGH (ref 0–10)

## 2021-05-17 LAB — TSH: TSH: 3.74 u[IU]/mL (ref 0.450–4.500)

## 2021-05-17 NOTE — Telephone Encounter (Signed)
Called and spoke with patient's daughter. Advised her to bring an updated list of medications and any OTC medication. Daughter verbalized understanding.  Nothing further needed at this time.

## 2021-05-17 NOTE — Telephone Encounter (Signed)
   Stephanie Clay has been scheduled for the following appointment:  WHAT: VASCULAR ULTRASOUND WHERE: [facility] DATE: 05/18/21 TIME: 11:00 AM ARRIVAL TIME  Patient has been made aware.

## 2021-05-18 DIAGNOSIS — D62 Acute posthemorrhagic anemia: Secondary | ICD-10-CM | POA: Diagnosis not present

## 2021-05-18 DIAGNOSIS — M79605 Pain in left leg: Secondary | ICD-10-CM | POA: Diagnosis not present

## 2021-05-18 DIAGNOSIS — F32A Depression, unspecified: Secondary | ICD-10-CM | POA: Diagnosis not present

## 2021-05-18 DIAGNOSIS — E876 Hypokalemia: Secondary | ICD-10-CM | POA: Diagnosis not present

## 2021-05-18 DIAGNOSIS — I82402 Acute embolism and thrombosis of unspecified deep veins of left lower extremity: Secondary | ICD-10-CM | POA: Diagnosis not present

## 2021-05-18 DIAGNOSIS — K5909 Other constipation: Secondary | ICD-10-CM | POA: Diagnosis not present

## 2021-05-18 DIAGNOSIS — I471 Supraventricular tachycardia: Secondary | ICD-10-CM | POA: Diagnosis not present

## 2021-05-18 DIAGNOSIS — K219 Gastro-esophageal reflux disease without esophagitis: Secondary | ICD-10-CM | POA: Diagnosis not present

## 2021-05-18 DIAGNOSIS — Z9181 History of falling: Secondary | ICD-10-CM | POA: Diagnosis not present

## 2021-05-18 DIAGNOSIS — M419 Scoliosis, unspecified: Secondary | ICD-10-CM | POA: Diagnosis not present

## 2021-05-18 DIAGNOSIS — I129 Hypertensive chronic kidney disease with stage 1 through stage 4 chronic kidney disease, or unspecified chronic kidney disease: Secondary | ICD-10-CM | POA: Diagnosis not present

## 2021-05-18 DIAGNOSIS — E875 Hyperkalemia: Secondary | ICD-10-CM | POA: Diagnosis not present

## 2021-05-18 DIAGNOSIS — E039 Hypothyroidism, unspecified: Secondary | ICD-10-CM | POA: Diagnosis not present

## 2021-05-18 DIAGNOSIS — Z86718 Personal history of other venous thrombosis and embolism: Secondary | ICD-10-CM | POA: Diagnosis not present

## 2021-05-18 DIAGNOSIS — A310 Pulmonary mycobacterial infection: Secondary | ICD-10-CM | POA: Diagnosis not present

## 2021-05-18 DIAGNOSIS — N183 Chronic kidney disease, stage 3 unspecified: Secondary | ICD-10-CM | POA: Diagnosis not present

## 2021-05-18 DIAGNOSIS — M7989 Other specified soft tissue disorders: Secondary | ICD-10-CM | POA: Diagnosis not present

## 2021-05-18 DIAGNOSIS — J449 Chronic obstructive pulmonary disease, unspecified: Secondary | ICD-10-CM | POA: Diagnosis not present

## 2021-05-18 DIAGNOSIS — S72142D Displaced intertrochanteric fracture of left femur, subsequent encounter for closed fracture with routine healing: Secondary | ICD-10-CM | POA: Diagnosis not present

## 2021-05-18 LAB — URINE CULTURE

## 2021-05-18 NOTE — Progress Notes (Signed)
I need results from X-ray lp

## 2021-05-18 NOTE — Progress Notes (Signed)
Patient had no active disease on chest x-ray lp

## 2021-05-19 DIAGNOSIS — K219 Gastro-esophageal reflux disease without esophagitis: Secondary | ICD-10-CM | POA: Diagnosis not present

## 2021-05-19 DIAGNOSIS — J449 Chronic obstructive pulmonary disease, unspecified: Secondary | ICD-10-CM | POA: Diagnosis not present

## 2021-05-19 DIAGNOSIS — E039 Hypothyroidism, unspecified: Secondary | ICD-10-CM | POA: Diagnosis not present

## 2021-05-19 DIAGNOSIS — Z86718 Personal history of other venous thrombosis and embolism: Secondary | ICD-10-CM | POA: Diagnosis not present

## 2021-05-19 DIAGNOSIS — K5909 Other constipation: Secondary | ICD-10-CM | POA: Diagnosis not present

## 2021-05-19 DIAGNOSIS — E876 Hypokalemia: Secondary | ICD-10-CM | POA: Diagnosis not present

## 2021-05-19 DIAGNOSIS — S72142D Displaced intertrochanteric fracture of left femur, subsequent encounter for closed fracture with routine healing: Secondary | ICD-10-CM | POA: Diagnosis not present

## 2021-05-19 DIAGNOSIS — F32A Depression, unspecified: Secondary | ICD-10-CM | POA: Diagnosis not present

## 2021-05-19 DIAGNOSIS — I471 Supraventricular tachycardia: Secondary | ICD-10-CM | POA: Diagnosis not present

## 2021-05-19 DIAGNOSIS — A310 Pulmonary mycobacterial infection: Secondary | ICD-10-CM | POA: Diagnosis not present

## 2021-05-19 DIAGNOSIS — N183 Chronic kidney disease, stage 3 unspecified: Secondary | ICD-10-CM | POA: Diagnosis not present

## 2021-05-19 DIAGNOSIS — E875 Hyperkalemia: Secondary | ICD-10-CM | POA: Diagnosis not present

## 2021-05-19 DIAGNOSIS — Z9181 History of falling: Secondary | ICD-10-CM | POA: Diagnosis not present

## 2021-05-19 DIAGNOSIS — I129 Hypertensive chronic kidney disease with stage 1 through stage 4 chronic kidney disease, or unspecified chronic kidney disease: Secondary | ICD-10-CM | POA: Diagnosis not present

## 2021-05-19 DIAGNOSIS — M419 Scoliosis, unspecified: Secondary | ICD-10-CM | POA: Diagnosis not present

## 2021-05-19 DIAGNOSIS — D62 Acute posthemorrhagic anemia: Secondary | ICD-10-CM | POA: Diagnosis not present

## 2021-05-21 LAB — QUANTIFERON-TB GOLD PLUS
QuantiFERON Mitogen Value: >10 IU/mL
QuantiFERON Nil Value: 0.05 IU/mL
QuantiFERON TB1 Ag Value: 0.05 IU/mL
QuantiFERON TB2 Ag Value: 0.05 IU/mL
QuantiFERON-TB Gold Plus: NEGATIVE

## 2021-05-21 NOTE — Progress Notes (Signed)
Urine culture negative, continues anemia stable, kidney tests normal potassium high 5.3, recheck in one week, liver tests normal, Cholesterol normal, TSH 3.74 normal, CRP 29  some inflammation, may be age,  lp

## 2021-05-22 ENCOUNTER — Other Ambulatory Visit: Payer: Self-pay

## 2021-05-22 ENCOUNTER — Encounter: Payer: Self-pay | Admitting: Emergency Medicine

## 2021-05-22 ENCOUNTER — Ambulatory Visit: Payer: Medicare Other | Admitting: Emergency Medicine

## 2021-05-22 DIAGNOSIS — A310 Pulmonary mycobacterial infection: Secondary | ICD-10-CM | POA: Diagnosis not present

## 2021-05-22 DIAGNOSIS — K5909 Other constipation: Secondary | ICD-10-CM | POA: Diagnosis not present

## 2021-05-22 DIAGNOSIS — I129 Hypertensive chronic kidney disease with stage 1 through stage 4 chronic kidney disease, or unspecified chronic kidney disease: Secondary | ICD-10-CM | POA: Diagnosis not present

## 2021-05-22 DIAGNOSIS — D62 Acute posthemorrhagic anemia: Secondary | ICD-10-CM | POA: Diagnosis not present

## 2021-05-22 DIAGNOSIS — E876 Hypokalemia: Secondary | ICD-10-CM | POA: Diagnosis not present

## 2021-05-22 DIAGNOSIS — J471 Bronchiectasis with (acute) exacerbation: Secondary | ICD-10-CM

## 2021-05-22 DIAGNOSIS — N183 Chronic kidney disease, stage 3 unspecified: Secondary | ICD-10-CM | POA: Diagnosis not present

## 2021-05-22 DIAGNOSIS — F32A Depression, unspecified: Secondary | ICD-10-CM | POA: Diagnosis not present

## 2021-05-22 DIAGNOSIS — I471 Supraventricular tachycardia: Secondary | ICD-10-CM | POA: Diagnosis not present

## 2021-05-22 DIAGNOSIS — Z86718 Personal history of other venous thrombosis and embolism: Secondary | ICD-10-CM | POA: Diagnosis not present

## 2021-05-22 DIAGNOSIS — M419 Scoliosis, unspecified: Secondary | ICD-10-CM | POA: Diagnosis not present

## 2021-05-22 DIAGNOSIS — Z9181 History of falling: Secondary | ICD-10-CM | POA: Diagnosis not present

## 2021-05-22 DIAGNOSIS — J449 Chronic obstructive pulmonary disease, unspecified: Secondary | ICD-10-CM | POA: Diagnosis not present

## 2021-05-22 DIAGNOSIS — K219 Gastro-esophageal reflux disease without esophagitis: Secondary | ICD-10-CM | POA: Diagnosis not present

## 2021-05-22 DIAGNOSIS — E039 Hypothyroidism, unspecified: Secondary | ICD-10-CM | POA: Diagnosis not present

## 2021-05-22 DIAGNOSIS — E875 Hyperkalemia: Secondary | ICD-10-CM | POA: Diagnosis not present

## 2021-05-22 DIAGNOSIS — S72142D Displaced intertrochanteric fracture of left femur, subsequent encounter for closed fracture with routine healing: Secondary | ICD-10-CM | POA: Diagnosis not present

## 2021-05-22 NOTE — Assessment & Plan Note (Signed)
Will defer FOB, would like for her to be able to produce sputum so we can check AFB. 3%NaCl may help w this. Continue bevespi as she has benefited  We will plan to continue Breztri 2 puffs twice a day.  Rinse and gargle after using. Use your albuterol nebulizer up to every 4 hours if needed for shortness of breath, coughing, mucus clearance, wheezing.  Okay to continue to take this twice a day. We will try to get 3% NaCl nebulizer solution for you.  This can help water down your mucus and help you clear your mucus when you cough. You would benefit from getting COVID-19 vaccination Follow with Dr Delton Coombes in 6 months or sooner if you have any problems

## 2021-05-22 NOTE — Progress Notes (Signed)
Subjective:    Patient ID: Stephanie Clay, female    DOB: [birth year], 87 y.o.   MRN: [id number]  HPI 87 year old never smoker whom I have seen before in the past for bronchiectasis and mucus impaction.  She has a fairly longstanding history of nausea, GERD, abdominal discomfort associated with anorexia and weight loss.  Also lower extremity DVT, allergic rhinitis, suspected intermittent aspiration.  She has never had positive AFB respiratory culture data to my knowledge.  Currently managed on Breztri, just started this last week, unsure whether she has benefited. Formerly she was on Trelegy, but it irritated her mouth.  She is unsure whether she wants to continue it.  She reports that she does cough but it is typically dry. Every few days the cough is persistent, lasts all day. She does not have an albuterol HFA right now.  On fluticasone nasal spray, Protonix twice daily.   CT scan of the chest 02/16/2021 done at [facility] reviewed by me, shows scattered areas of dilated airways, bronchiectasis with mucus impaction, scattered groundglass and micronodular disease affecting all lobes, more so in the right upper, right middle, left upper.  ROV 05/22/21 --87 year old never smoker with a history of bronchiectasis and mucus impaction on CT chest as recently as 02/16/2021, scattered groundglass suspicious for atypical mycobacterial infection (never proven) also with associated severe obstructive lung disease, on Breztri since the beginning of May 2022.  At her last visit I added 3% saline nebs to see if this would help with mucus clearance, possibly even allow AFB culture.  She was never able to get it due to Sport and exercise psychologist. Today she reports that she is using Breztri, albuterol nebs bid Since last time she fell and fx her L hip, had to have sgy, has been moving slower and has not felt well since then. She was treated for thrush - still has dryness. Minimal cough, not every day. Poor mucous  clearance. She was not    Review of Systems As per HPI   Past Medical History:  Diagnosis Date   Abdominal pain 01/27/2018   AKI (acute kidney injury) (HCC) 06/30/2016   Allergic rhinitis 08/20/2016   Anemia    Aneurysm of other specified arteries (HCC)    Anxiety    Arthritis    Asthma    Bronchiectasis with evidence of chronic atypical infection 01/03/2015   CT chest 12/2014:  Bronchiectasis throughout right lung, left clear.  +tree and bud, + patchy gg infiltrates in RLL Sputum 2016: no growth, AFB smear neg PFT's 02/2015:  Normal except mild decrease in DLCO   Bronchiectasis without acute exacerbation (HCC) 01/03/2015   CT chest 12/2014:  Bronchiectasis throughout right lung, left clear.  +tree and bud, + patchy gg infiltrates in RLL Sputum 2016: no growth, AFB smear neg PFT's 02/2015:  Normal except mild decrease in DLCO    Cataract    bilateral eye surgery   Chronic bilateral low back pain without sciatica 11/29/2019   Chronic chest pain    Chronic idiopathic constipation    Chronic kidney disease, stage 3 (HCC)    Chronic venous hypertension (idiopathic) with other complications of bilateral lower extremity    Decreased pedal pulses 03/24/2020   Depression    Dysphagia 03/01/2015   Essential hypertension 07/01/2016   Gastro-esophageal reflux    Heart murmur    Hyperglycemia 07/01/2016   Hyperlipemia    Hyperlipidemia 07/01/2016   Hypertension    Hypokalemia 07/01/2016   Hypomagnesemia 07/01/2016  Hypothyroidism 07/01/2016   Idiopathic progressive neuropathy    Iron (Fe) deficiency anemia    Joint pain 03/24/2020   Localized edema 11/26/2019   Mild vitamin D deficiency 11/29/2019   Moderate protein-calorie malnutrition (HCC)    Normocytic anemia 01/07/2020   Other fatigue 11/26/2019   Other idiopathic scoliosis, lumbar region    Pneumathemia (HCC)    Primary generalized (osteo)arthritis    Primary insomnia    Radiculopathy, lumbar region    Scoliosis    Secondary hyperparathyroidism  (HCC)    Severe protein-calorie malnutrition (HCC) 03/24/2020   Underweight 07/02/2016   Unilateral primary osteoarthritis, left knee    Unilateral primary osteoarthritis, right knee      Family History  Problem Relation Age of Onset   Emphysema Father    Heart disease Mother    Cancer Sister        metastatic lung cancer   Heart disease Sister    Hypertension Sister    Varicose Veins Sister    Hyperlipidemia Daughter    Hypertension Daughter    Diabetes Daughter    Hyperlipidemia Daughter    Hypertension Daughter    Thyroid disease Daughter    Thyroid disease Daughter    Colon cancer Neg Hx    Esophageal cancer Neg Hx    Stomach cancer Neg Hx      Social History   Socioeconomic History   Marital status: Widowed    Spouse name: carl   Number of children: 3   Years of education: 9   Highest education level: Not on file  Occupational History   Occupation: retired  Tobacco Use   Smoking status: Never   Smokeless tobacco: Never  Vaping Use   Vaping Use: Never used  Substance and Sexual Activity   Alcohol use: No    Alcohol/week: 0.0 standard drinks   Drug use: No   Sexual activity: Not Currently  Other Topics Concern   Not on file  Social History Narrative   Right handed   Social Determinants of Health   Financial Resource Strain: Not on file  Food Insecurity: No Food Insecurity   Worried About Running Out of Food in the Last Year: Never true   Ran Out of Food in the Last Year: Never true  Transportation Needs: No Transportation Needs   Lack of Transportation (Medical): No   Lack of Transportation (Non-Medical): No  Physical Activity: Not on file  Stress: Not on file  Social Connections: Not on file  Intimate Partner Violence: Not on file     Allergies  Allergen Reactions   Lisinopril Other (See Comments)   Remeron [Mirtazapine] Other (See Comments)   Ciprofloxacin     unknown   Levofloxacin     unknonw   Nitrofurantoin     unknown   Omnicef  [Cefdinir] Diarrhea   Sertraline Hcl Other (See Comments)    Tremors     Outpatient Medications Prior to Visit  Medication Sig Dispense Refill   acetaminophen (TYLENOL) 325 MG tablet Take 650 mg by mouth every 6 (six) hours as needed for mild pain.     albuterol (VENTOLIN HFA) 108 (90 Base) MCG/ACT inhaler Inhale 2 puffs into the lungs every 6 (six) hours as needed for wheezing or shortness of breath. 8 g 6   ALPRAZolam (XANAX) 0.5 MG tablet Take 0.5 mg by mouth every 12 (twelve) hours as needed for anxiety.     aspirin 325 MG tablet Take 325 mg by mouth 3 (three) times  daily before meals.     Budeson-Glycopyrrol-Formoterol (BREZTRI AEROSPHERE) 160-9-4.8 MCG/ACT AERO Inhale 2 puffs into the lungs in the morning and at bedtime. 10.7 g 3   Cholecalciferol (VITAMIN D3 PO) Take 2,000 Units by mouth daily.     Cyanocobalamin 2500 MCG TABS Take 2,500 mcg by mouth daily.     dicyclomine (BENTYL) 10 MG capsule TAKE 1 CAPSULE BY MOUTH BEFORE EACH MEAL AND AT BEDTIME AS NEEDED FOR COLON SPASM 120 capsule 0   diltiazem (CARDIZEM) 30 MG tablet Take 30 mg by mouth every 8 (eight) hours.     fluticasone (FLONASE) 50 MCG/ACT nasal spray Place 2 sprays into both nostrils as needed for allergies or rhinitis.     gabapentin (NEURONTIN) 300 MG capsule Take 300 mg by mouth 2 (two) times daily.     levothyroxine (SYNTHROID) 50 MCG tablet TAKE 1 TABLET BY MOUTH DAILY 90 tablet 0   metoprolol succinate (TOPROL-XL) 25 MG 24 hr tablet Take 25 mg by mouth daily.     nitroGLYCERIN (NITROSTAT) 0.4 MG SL tablet Place 0.4 mg under the tongue every 5 (five) minutes as needed for chest pain. X 3 doses     nystatin (MYCOSTATIN) 100000 UNIT/ML suspension Take 5 mLs (500,000 Units total) by mouth 3 (three) times daily. 60 mL 3   ondansetron (ZOFRAN-ODT) 4 MG disintegrating tablet Take 4 mg by mouth as needed for nausea/vomiting.     oxyCODONE (OXY IR/ROXICODONE) 5 MG immediate release tablet Take 5 mg by mouth every 4 (four)  hours as needed for pain or severe pain.     Oxycodone HCl 10 MG TABS Take 10 mg by mouth every 4 (four) hours as needed (pain).     pantoprazole (PROTONIX) 40 MG tablet TAKE 1 TABLET TWICE DAILY 180 tablet 1   polyethylene glycol (MIRALAX / GLYCOLAX) 17 g packet Take 17 g by mouth 2 (two) times daily.     potassium chloride SA (KLOR-CON) 20 MEQ tablet TAKE 3 TABLETS BY MOUTH ONCE DAILY. 90 tablet 2   sennosides-docusate sodium (SENOKOT-S) 8.6-50 MG tablet Take 1 tablet by mouth 2 (two) times daily.     simvastatin (ZOCOR) 10 MG tablet Take 10 mg by mouth daily. cholesterol     venlafaxine XR (EFFEXOR-XR) 150 MG 24 hr capsule TAKE 1 CAPSULE BY MOUTH IN THE MORNING 30 capsule 0   No facility-administered medications prior to visit.         Objective:   Physical Exam Vitals:   05/22/21 0909  BP: 128/68  Pulse: 67  Temp: 97.8 F (36.6 C)  TempSrc: Oral  SpO2: 91%  Weight: 77 lb 3.2 oz (35 kg)  Height: 4\' 11"  (1.499 m)   Gen: Pleasant, thin small woman, chronically ill, in no distress,  normal affect  ENT: No lesions,  mouth clear,  oropharynx clear, no postnasal drip, poor dentition, no thrush.   Neck: No JVD, no stridor  Lungs: No use of accessory muscles, bilateral inspiratory squeaks and expiratory rhonchi  Cardiovascular: RRR, heart sounds normal, no murmur or gallops, no peripheral edema  Musculoskeletal: No deformities, no cyanosis or clubbing  Neuro: alert, awake, non focal  Skin: Warm, no lesions or rash, venous stasis changes B feet and ankles      Assessment & Plan:  Bronchiectasis with evidence of chronic atypical infection Will defer FOB, would like for her to be able to produce sputum so we can check AFB. 3%NaCl may help w this. Continue bevespi as she has benefited  We will plan to continue Breztri 2 puffs twice a day.  Rinse and gargle after using. Use your albuterol nebulizer up to every 4 hours if needed for shortness of breath, coughing, mucus  clearance, wheezing.  Okay to continue to take this twice a day. We will try to get 3% NaCl nebulizer solution for you.  This can help water down your mucus and help you clear your mucus when you cough. You would benefit from getting COVID-19 vaccination Follow with Dr Delton Coombes in 6 months or sooner if you have any problems   Levy Pupa, MD, PhD 05/22/2021, 5:54 PM [facility] [phone] or if no answer before 7:00PM call [phone] For any issues after 7:00PM please call eLink [phone]

## 2021-05-22 NOTE — Patient Instructions (Signed)
We will plan to continue Breztri 2 puffs twice a day.  Rinse and gargle after using. Use your albuterol nebulizer up to every 4 hours if needed for shortness of breath, coughing, mucus clearance, wheezing.  Okay to continue to take this twice a day. We will try to get 3% NaCl nebulizer solution for you.  This can help water down your mucus and help you clear your mucus when you cough. You would benefit from getting COVID-19 vaccination Follow with Dr Delton Coombes in 6 months or sooner if you have any problems

## 2021-05-22 NOTE — Progress Notes (Signed)
Quantiferon negative lp

## 2021-05-23 DIAGNOSIS — I471 Supraventricular tachycardia: Secondary | ICD-10-CM

## 2021-05-23 DIAGNOSIS — F419 Anxiety disorder, unspecified: Secondary | ICD-10-CM | POA: Diagnosis not present

## 2021-05-23 DIAGNOSIS — F32A Depression, unspecified: Secondary | ICD-10-CM

## 2021-05-23 DIAGNOSIS — I129 Hypertensive chronic kidney disease with stage 1 through stage 4 chronic kidney disease, or unspecified chronic kidney disease: Secondary | ICD-10-CM

## 2021-05-23 DIAGNOSIS — S72142D Displaced intertrochanteric fracture of left femur, subsequent encounter for closed fracture with routine healing: Secondary | ICD-10-CM

## 2021-05-24 ENCOUNTER — Telehealth: Payer: Self-pay | Admitting: Emergency Medicine

## 2021-05-24 DIAGNOSIS — Z86718 Personal history of other venous thrombosis and embolism: Secondary | ICD-10-CM | POA: Diagnosis not present

## 2021-05-24 DIAGNOSIS — E039 Hypothyroidism, unspecified: Secondary | ICD-10-CM | POA: Diagnosis not present

## 2021-05-24 DIAGNOSIS — I129 Hypertensive chronic kidney disease with stage 1 through stage 4 chronic kidney disease, or unspecified chronic kidney disease: Secondary | ICD-10-CM | POA: Diagnosis not present

## 2021-05-24 DIAGNOSIS — N183 Chronic kidney disease, stage 3 unspecified: Secondary | ICD-10-CM | POA: Diagnosis not present

## 2021-05-24 DIAGNOSIS — I471 Supraventricular tachycardia: Secondary | ICD-10-CM | POA: Diagnosis not present

## 2021-05-24 DIAGNOSIS — Z9181 History of falling: Secondary | ICD-10-CM | POA: Diagnosis not present

## 2021-05-24 DIAGNOSIS — E876 Hypokalemia: Secondary | ICD-10-CM | POA: Diagnosis not present

## 2021-05-24 DIAGNOSIS — F32A Depression, unspecified: Secondary | ICD-10-CM | POA: Diagnosis not present

## 2021-05-24 DIAGNOSIS — S72142D Displaced intertrochanteric fracture of left femur, subsequent encounter for closed fracture with routine healing: Secondary | ICD-10-CM | POA: Diagnosis not present

## 2021-05-24 DIAGNOSIS — K219 Gastro-esophageal reflux disease without esophagitis: Secondary | ICD-10-CM | POA: Diagnosis not present

## 2021-05-24 DIAGNOSIS — M419 Scoliosis, unspecified: Secondary | ICD-10-CM | POA: Diagnosis not present

## 2021-05-24 DIAGNOSIS — K5909 Other constipation: Secondary | ICD-10-CM | POA: Diagnosis not present

## 2021-05-24 DIAGNOSIS — J449 Chronic obstructive pulmonary disease, unspecified: Secondary | ICD-10-CM | POA: Diagnosis not present

## 2021-05-24 DIAGNOSIS — E875 Hyperkalemia: Secondary | ICD-10-CM | POA: Diagnosis not present

## 2021-05-24 DIAGNOSIS — D62 Acute posthemorrhagic anemia: Secondary | ICD-10-CM | POA: Diagnosis not present

## 2021-05-24 DIAGNOSIS — A310 Pulmonary mycobacterial infection: Secondary | ICD-10-CM | POA: Diagnosis not present

## 2021-05-24 NOTE — Telephone Encounter (Signed)
Stephanie Clay came into office, states that pt okay for Sodium chloride Neb Inhalation 64ml/3.5%. Dropped of form to fill out to make process easier. States that everything on forms needs to be filled out. I have created a example of what to fill out for meds per [facility].  Form have been placed in Dr. Delton Coombes box . Can contact [name] with questions on how to fill out form [phone]

## 2021-05-24 NOTE — Telephone Encounter (Signed)
Will forward to AJ to follow up on form in box to have completed and signed.

## 2021-05-25 DIAGNOSIS — F32A Depression, unspecified: Secondary | ICD-10-CM | POA: Diagnosis not present

## 2021-05-25 DIAGNOSIS — K5909 Other constipation: Secondary | ICD-10-CM | POA: Diagnosis not present

## 2021-05-25 DIAGNOSIS — Z9181 History of falling: Secondary | ICD-10-CM | POA: Diagnosis not present

## 2021-05-25 DIAGNOSIS — D62 Acute posthemorrhagic anemia: Secondary | ICD-10-CM | POA: Diagnosis not present

## 2021-05-25 DIAGNOSIS — J449 Chronic obstructive pulmonary disease, unspecified: Secondary | ICD-10-CM | POA: Diagnosis not present

## 2021-05-25 DIAGNOSIS — E876 Hypokalemia: Secondary | ICD-10-CM | POA: Diagnosis not present

## 2021-05-25 DIAGNOSIS — E039 Hypothyroidism, unspecified: Secondary | ICD-10-CM | POA: Diagnosis not present

## 2021-05-25 DIAGNOSIS — N183 Chronic kidney disease, stage 3 unspecified: Secondary | ICD-10-CM | POA: Diagnosis not present

## 2021-05-25 DIAGNOSIS — A310 Pulmonary mycobacterial infection: Secondary | ICD-10-CM | POA: Diagnosis not present

## 2021-05-25 DIAGNOSIS — S72142D Displaced intertrochanteric fracture of left femur, subsequent encounter for closed fracture with routine healing: Secondary | ICD-10-CM | POA: Diagnosis not present

## 2021-05-25 DIAGNOSIS — K219 Gastro-esophageal reflux disease without esophagitis: Secondary | ICD-10-CM | POA: Diagnosis not present

## 2021-05-25 DIAGNOSIS — Z86718 Personal history of other venous thrombosis and embolism: Secondary | ICD-10-CM | POA: Diagnosis not present

## 2021-05-25 DIAGNOSIS — E875 Hyperkalemia: Secondary | ICD-10-CM | POA: Diagnosis not present

## 2021-05-25 DIAGNOSIS — I129 Hypertensive chronic kidney disease with stage 1 through stage 4 chronic kidney disease, or unspecified chronic kidney disease: Secondary | ICD-10-CM | POA: Diagnosis not present

## 2021-05-25 DIAGNOSIS — I471 Supraventricular tachycardia: Secondary | ICD-10-CM | POA: Diagnosis not present

## 2021-05-25 DIAGNOSIS — M419 Scoliosis, unspecified: Secondary | ICD-10-CM | POA: Diagnosis not present

## 2021-05-26 ENCOUNTER — Other Ambulatory Visit: Payer: Self-pay

## 2021-05-26 MED ORDER — GABAPENTIN 300 MG PO CAPS: 300.0000 mg | ORAL_CAPSULE | Freq: Two times a day (BID) | ORAL | 1 refills | Status: DC

## 2021-05-26 MED ORDER — SIMVASTATIN 10 MG PO TABS: 10.0000 mg | ORAL_TABLET | Freq: Every day | ORAL | 0 refills | Status: DC

## 2021-05-26 MED ORDER — DILTIAZEM HCL 30 MG PO TABS: 30.0000 mg | ORAL_TABLET | Freq: Three times a day (TID) | ORAL | 0 refills | Status: DC

## 2021-05-26 MED ORDER — METOPROLOL SUCCINATE ER 25 MG PO TB24: 25.0000 mg | ORAL_TABLET | Freq: Every day | ORAL | 0 refills | Status: DC

## 2021-05-29 DIAGNOSIS — D62 Acute posthemorrhagic anemia: Secondary | ICD-10-CM | POA: Diagnosis not present

## 2021-05-29 DIAGNOSIS — I129 Hypertensive chronic kidney disease with stage 1 through stage 4 chronic kidney disease, or unspecified chronic kidney disease: Secondary | ICD-10-CM | POA: Diagnosis not present

## 2021-05-29 DIAGNOSIS — J449 Chronic obstructive pulmonary disease, unspecified: Secondary | ICD-10-CM | POA: Diagnosis not present

## 2021-05-29 DIAGNOSIS — K219 Gastro-esophageal reflux disease without esophagitis: Secondary | ICD-10-CM | POA: Diagnosis not present

## 2021-05-29 DIAGNOSIS — A310 Pulmonary mycobacterial infection: Secondary | ICD-10-CM | POA: Diagnosis not present

## 2021-05-29 DIAGNOSIS — N183 Chronic kidney disease, stage 3 unspecified: Secondary | ICD-10-CM | POA: Diagnosis not present

## 2021-05-29 DIAGNOSIS — F32A Depression, unspecified: Secondary | ICD-10-CM | POA: Diagnosis not present

## 2021-05-29 DIAGNOSIS — E876 Hypokalemia: Secondary | ICD-10-CM | POA: Diagnosis not present

## 2021-05-29 DIAGNOSIS — K5909 Other constipation: Secondary | ICD-10-CM | POA: Diagnosis not present

## 2021-05-29 DIAGNOSIS — E039 Hypothyroidism, unspecified: Secondary | ICD-10-CM | POA: Diagnosis not present

## 2021-05-29 DIAGNOSIS — I471 Supraventricular tachycardia: Secondary | ICD-10-CM | POA: Diagnosis not present

## 2021-05-29 DIAGNOSIS — Z86718 Personal history of other venous thrombosis and embolism: Secondary | ICD-10-CM | POA: Diagnosis not present

## 2021-05-29 DIAGNOSIS — M419 Scoliosis, unspecified: Secondary | ICD-10-CM | POA: Diagnosis not present

## 2021-05-29 DIAGNOSIS — E875 Hyperkalemia: Secondary | ICD-10-CM | POA: Diagnosis not present

## 2021-05-29 DIAGNOSIS — S72142D Displaced intertrochanteric fracture of left femur, subsequent encounter for closed fracture with routine healing: Secondary | ICD-10-CM | POA: Diagnosis not present

## 2021-05-29 DIAGNOSIS — Z9181 History of falling: Secondary | ICD-10-CM | POA: Diagnosis not present

## 2021-05-29 NOTE — Telephone Encounter (Signed)
Forms filled out, signed and faxed to [facility] on 05/26/21. Fax confirmation received. Nothing further needed at this time.

## 2021-05-30 ENCOUNTER — Other Ambulatory Visit: Payer: Medicare Other

## 2021-05-30 ENCOUNTER — Other Ambulatory Visit: Payer: Self-pay

## 2021-05-30 DIAGNOSIS — E875 Hyperkalemia: Secondary | ICD-10-CM | POA: Diagnosis not present

## 2021-05-31 LAB — COMPREHENSIVE METABOLIC PANEL
ALT: 6 IU/L (ref 0–32)
AST: 12 IU/L (ref 0–40)
Albumin/Globulin Ratio: 1.3 (ref 1.2–2.2)
Albumin: 3.9 g/dL (ref 3.6–4.6)
Alkaline Phosphatase: 129 IU/L — ABNORMAL HIGH (ref 44–121)
BUN/Creatinine Ratio: 28 (ref 12–28)
BUN: 25 mg/dL (ref 8–27)
Bilirubin Total: <0.2 mg/dL (ref 0.0–1.2)
CO2: 26 mmol/L (ref 20–29)
Calcium: 9.3 mg/dL (ref 8.7–10.3)
Chloride: 103 mmol/L (ref 96–106)
Creatinine, Ser: 0.88 mg/dL (ref 0.57–1.00)
Globulin, Total: 3.1 g/dL (ref 1.5–4.5)
Glucose: 122 mg/dL — ABNORMAL HIGH (ref 65–99)
Potassium: 4.2 mmol/L (ref 3.5–5.2)
Sodium: 145 mmol/L — ABNORMAL HIGH (ref 134–144)
Total Protein: 7 g/dL (ref 6.0–8.5)
eGFR: 64 mL/min/{1.73_m2} (ref >59)

## 2021-06-01 DIAGNOSIS — Z9181 History of falling: Secondary | ICD-10-CM | POA: Diagnosis not present

## 2021-06-01 DIAGNOSIS — N183 Chronic kidney disease, stage 3 unspecified: Secondary | ICD-10-CM | POA: Diagnosis not present

## 2021-06-01 DIAGNOSIS — A310 Pulmonary mycobacterial infection: Secondary | ICD-10-CM | POA: Diagnosis not present

## 2021-06-01 DIAGNOSIS — K5909 Other constipation: Secondary | ICD-10-CM | POA: Diagnosis not present

## 2021-06-01 DIAGNOSIS — K219 Gastro-esophageal reflux disease without esophagitis: Secondary | ICD-10-CM | POA: Diagnosis not present

## 2021-06-01 DIAGNOSIS — I471 Supraventricular tachycardia: Secondary | ICD-10-CM | POA: Diagnosis not present

## 2021-06-01 DIAGNOSIS — S72142D Displaced intertrochanteric fracture of left femur, subsequent encounter for closed fracture with routine healing: Secondary | ICD-10-CM | POA: Diagnosis not present

## 2021-06-01 DIAGNOSIS — E039 Hypothyroidism, unspecified: Secondary | ICD-10-CM | POA: Diagnosis not present

## 2021-06-01 DIAGNOSIS — E875 Hyperkalemia: Secondary | ICD-10-CM | POA: Diagnosis not present

## 2021-06-01 DIAGNOSIS — M419 Scoliosis, unspecified: Secondary | ICD-10-CM | POA: Diagnosis not present

## 2021-06-01 DIAGNOSIS — D62 Acute posthemorrhagic anemia: Secondary | ICD-10-CM | POA: Diagnosis not present

## 2021-06-01 DIAGNOSIS — Z86718 Personal history of other venous thrombosis and embolism: Secondary | ICD-10-CM | POA: Diagnosis not present

## 2021-06-01 DIAGNOSIS — F32A Depression, unspecified: Secondary | ICD-10-CM | POA: Diagnosis not present

## 2021-06-01 DIAGNOSIS — E876 Hypokalemia: Secondary | ICD-10-CM | POA: Diagnosis not present

## 2021-06-01 DIAGNOSIS — I129 Hypertensive chronic kidney disease with stage 1 through stage 4 chronic kidney disease, or unspecified chronic kidney disease: Secondary | ICD-10-CM | POA: Diagnosis not present

## 2021-06-01 DIAGNOSIS — J449 Chronic obstructive pulmonary disease, unspecified: Secondary | ICD-10-CM | POA: Diagnosis not present

## 2021-06-05 DIAGNOSIS — D62 Acute posthemorrhagic anemia: Secondary | ICD-10-CM | POA: Diagnosis not present

## 2021-06-05 DIAGNOSIS — A310 Pulmonary mycobacterial infection: Secondary | ICD-10-CM | POA: Diagnosis not present

## 2021-06-05 DIAGNOSIS — N183 Chronic kidney disease, stage 3 unspecified: Secondary | ICD-10-CM | POA: Diagnosis not present

## 2021-06-05 DIAGNOSIS — E876 Hypokalemia: Secondary | ICD-10-CM | POA: Diagnosis not present

## 2021-06-05 DIAGNOSIS — E039 Hypothyroidism, unspecified: Secondary | ICD-10-CM | POA: Diagnosis not present

## 2021-06-05 DIAGNOSIS — F32A Depression, unspecified: Secondary | ICD-10-CM | POA: Diagnosis not present

## 2021-06-05 DIAGNOSIS — J449 Chronic obstructive pulmonary disease, unspecified: Secondary | ICD-10-CM | POA: Diagnosis not present

## 2021-06-05 DIAGNOSIS — K5909 Other constipation: Secondary | ICD-10-CM | POA: Diagnosis not present

## 2021-06-05 DIAGNOSIS — Z86718 Personal history of other venous thrombosis and embolism: Secondary | ICD-10-CM | POA: Diagnosis not present

## 2021-06-05 DIAGNOSIS — Z9181 History of falling: Secondary | ICD-10-CM | POA: Diagnosis not present

## 2021-06-05 DIAGNOSIS — S72142D Displaced intertrochanteric fracture of left femur, subsequent encounter for closed fracture with routine healing: Secondary | ICD-10-CM | POA: Diagnosis not present

## 2021-06-05 DIAGNOSIS — E875 Hyperkalemia: Secondary | ICD-10-CM | POA: Diagnosis not present

## 2021-06-05 DIAGNOSIS — I471 Supraventricular tachycardia: Secondary | ICD-10-CM | POA: Diagnosis not present

## 2021-06-05 DIAGNOSIS — I129 Hypertensive chronic kidney disease with stage 1 through stage 4 chronic kidney disease, or unspecified chronic kidney disease: Secondary | ICD-10-CM | POA: Diagnosis not present

## 2021-06-05 DIAGNOSIS — M419 Scoliosis, unspecified: Secondary | ICD-10-CM | POA: Diagnosis not present

## 2021-06-05 DIAGNOSIS — K219 Gastro-esophageal reflux disease without esophagitis: Secondary | ICD-10-CM | POA: Diagnosis not present

## 2021-06-06 DIAGNOSIS — M419 Scoliosis, unspecified: Secondary | ICD-10-CM | POA: Diagnosis not present

## 2021-06-06 DIAGNOSIS — Z9181 History of falling: Secondary | ICD-10-CM | POA: Diagnosis not present

## 2021-06-06 DIAGNOSIS — A310 Pulmonary mycobacterial infection: Secondary | ICD-10-CM | POA: Diagnosis not present

## 2021-06-06 DIAGNOSIS — S72142D Displaced intertrochanteric fracture of left femur, subsequent encounter for closed fracture with routine healing: Secondary | ICD-10-CM | POA: Diagnosis not present

## 2021-06-06 DIAGNOSIS — N183 Chronic kidney disease, stage 3 unspecified: Secondary | ICD-10-CM | POA: Diagnosis not present

## 2021-06-06 DIAGNOSIS — K5909 Other constipation: Secondary | ICD-10-CM | POA: Diagnosis not present

## 2021-06-06 DIAGNOSIS — D62 Acute posthemorrhagic anemia: Secondary | ICD-10-CM | POA: Diagnosis not present

## 2021-06-06 DIAGNOSIS — J449 Chronic obstructive pulmonary disease, unspecified: Secondary | ICD-10-CM | POA: Diagnosis not present

## 2021-06-06 DIAGNOSIS — I471 Supraventricular tachycardia: Secondary | ICD-10-CM | POA: Diagnosis not present

## 2021-06-06 DIAGNOSIS — Z86718 Personal history of other venous thrombosis and embolism: Secondary | ICD-10-CM | POA: Diagnosis not present

## 2021-06-06 DIAGNOSIS — K219 Gastro-esophageal reflux disease without esophagitis: Secondary | ICD-10-CM | POA: Diagnosis not present

## 2021-06-06 DIAGNOSIS — E876 Hypokalemia: Secondary | ICD-10-CM | POA: Diagnosis not present

## 2021-06-06 DIAGNOSIS — E875 Hyperkalemia: Secondary | ICD-10-CM | POA: Diagnosis not present

## 2021-06-06 DIAGNOSIS — I129 Hypertensive chronic kidney disease with stage 1 through stage 4 chronic kidney disease, or unspecified chronic kidney disease: Secondary | ICD-10-CM | POA: Diagnosis not present

## 2021-06-06 DIAGNOSIS — E039 Hypothyroidism, unspecified: Secondary | ICD-10-CM | POA: Diagnosis not present

## 2021-06-06 DIAGNOSIS — F32A Depression, unspecified: Secondary | ICD-10-CM | POA: Diagnosis not present

## 2021-06-08 ENCOUNTER — Other Ambulatory Visit: Payer: Medicare Other

## 2021-06-09 DIAGNOSIS — I129 Hypertensive chronic kidney disease with stage 1 through stage 4 chronic kidney disease, or unspecified chronic kidney disease: Secondary | ICD-10-CM | POA: Diagnosis not present

## 2021-06-09 DIAGNOSIS — Z9181 History of falling: Secondary | ICD-10-CM | POA: Diagnosis not present

## 2021-06-09 DIAGNOSIS — E875 Hyperkalemia: Secondary | ICD-10-CM | POA: Diagnosis not present

## 2021-06-09 DIAGNOSIS — D62 Acute posthemorrhagic anemia: Secondary | ICD-10-CM | POA: Diagnosis not present

## 2021-06-09 DIAGNOSIS — J449 Chronic obstructive pulmonary disease, unspecified: Secondary | ICD-10-CM | POA: Diagnosis not present

## 2021-06-09 DIAGNOSIS — I471 Supraventricular tachycardia: Secondary | ICD-10-CM | POA: Diagnosis not present

## 2021-06-09 DIAGNOSIS — A310 Pulmonary mycobacterial infection: Secondary | ICD-10-CM | POA: Diagnosis not present

## 2021-06-09 DIAGNOSIS — Z86718 Personal history of other venous thrombosis and embolism: Secondary | ICD-10-CM | POA: Diagnosis not present

## 2021-06-09 DIAGNOSIS — M419 Scoliosis, unspecified: Secondary | ICD-10-CM | POA: Diagnosis not present

## 2021-06-09 DIAGNOSIS — E039 Hypothyroidism, unspecified: Secondary | ICD-10-CM | POA: Diagnosis not present

## 2021-06-09 DIAGNOSIS — E876 Hypokalemia: Secondary | ICD-10-CM | POA: Diagnosis not present

## 2021-06-09 DIAGNOSIS — K219 Gastro-esophageal reflux disease without esophagitis: Secondary | ICD-10-CM | POA: Diagnosis not present

## 2021-06-09 DIAGNOSIS — F32A Depression, unspecified: Secondary | ICD-10-CM | POA: Diagnosis not present

## 2021-06-09 DIAGNOSIS — S72142D Displaced intertrochanteric fracture of left femur, subsequent encounter for closed fracture with routine healing: Secondary | ICD-10-CM | POA: Diagnosis not present

## 2021-06-09 DIAGNOSIS — N183 Chronic kidney disease, stage 3 unspecified: Secondary | ICD-10-CM | POA: Diagnosis not present

## 2021-06-09 DIAGNOSIS — K5909 Other constipation: Secondary | ICD-10-CM | POA: Diagnosis not present

## 2021-06-12 ENCOUNTER — Other Ambulatory Visit: Payer: Self-pay

## 2021-06-12 ENCOUNTER — Telehealth: Payer: Self-pay

## 2021-06-12 DIAGNOSIS — F32A Depression, unspecified: Secondary | ICD-10-CM | POA: Diagnosis not present

## 2021-06-12 DIAGNOSIS — K219 Gastro-esophageal reflux disease without esophagitis: Secondary | ICD-10-CM | POA: Diagnosis not present

## 2021-06-12 DIAGNOSIS — E875 Hyperkalemia: Secondary | ICD-10-CM | POA: Diagnosis not present

## 2021-06-12 DIAGNOSIS — J449 Chronic obstructive pulmonary disease, unspecified: Secondary | ICD-10-CM | POA: Diagnosis not present

## 2021-06-12 DIAGNOSIS — Z9181 History of falling: Secondary | ICD-10-CM | POA: Diagnosis not present

## 2021-06-12 DIAGNOSIS — Z86718 Personal history of other venous thrombosis and embolism: Secondary | ICD-10-CM | POA: Diagnosis not present

## 2021-06-12 DIAGNOSIS — I471 Supraventricular tachycardia: Secondary | ICD-10-CM | POA: Diagnosis not present

## 2021-06-12 DIAGNOSIS — E876 Hypokalemia: Secondary | ICD-10-CM | POA: Diagnosis not present

## 2021-06-12 DIAGNOSIS — S72142D Displaced intertrochanteric fracture of left femur, subsequent encounter for closed fracture with routine healing: Secondary | ICD-10-CM | POA: Diagnosis not present

## 2021-06-12 DIAGNOSIS — N183 Chronic kidney disease, stage 3 unspecified: Secondary | ICD-10-CM | POA: Diagnosis not present

## 2021-06-12 DIAGNOSIS — I129 Hypertensive chronic kidney disease with stage 1 through stage 4 chronic kidney disease, or unspecified chronic kidney disease: Secondary | ICD-10-CM | POA: Diagnosis not present

## 2021-06-12 DIAGNOSIS — D62 Acute posthemorrhagic anemia: Secondary | ICD-10-CM | POA: Diagnosis not present

## 2021-06-12 DIAGNOSIS — E039 Hypothyroidism, unspecified: Secondary | ICD-10-CM | POA: Diagnosis not present

## 2021-06-12 DIAGNOSIS — M419 Scoliosis, unspecified: Secondary | ICD-10-CM | POA: Diagnosis not present

## 2021-06-12 DIAGNOSIS — K5909 Other constipation: Secondary | ICD-10-CM | POA: Diagnosis not present

## 2021-06-12 DIAGNOSIS — A310 Pulmonary mycobacterial infection: Secondary | ICD-10-CM | POA: Diagnosis not present

## 2021-06-12 MED ORDER — BREZTRI AEROSPHERE 160-9-4.8 MCG/ACT IN AERO: 2.0000 | INHALATION_SPRAY | Freq: Two times a day (BID) | RESPIRATORY_TRACT | 2 refills | Status: DC

## 2021-06-12 NOTE — Telephone Encounter (Signed)
Stephanie Clay from [facility] called stating that the patient just got out of [facility], and was stating that she was put on [facility] and stated that it has really been helping [name], and now is needing a refill on it which is the Breztri 10.7 taking 2 puffs daily. She was wanting to know if you could send that in for the patient to [facility].

## 2021-06-14 DIAGNOSIS — K5909 Other constipation: Secondary | ICD-10-CM | POA: Diagnosis not present

## 2021-06-14 DIAGNOSIS — E875 Hyperkalemia: Secondary | ICD-10-CM | POA: Diagnosis not present

## 2021-06-14 DIAGNOSIS — D62 Acute posthemorrhagic anemia: Secondary | ICD-10-CM | POA: Diagnosis not present

## 2021-06-14 DIAGNOSIS — Z9181 History of falling: Secondary | ICD-10-CM | POA: Diagnosis not present

## 2021-06-14 DIAGNOSIS — E876 Hypokalemia: Secondary | ICD-10-CM | POA: Diagnosis not present

## 2021-06-14 DIAGNOSIS — I129 Hypertensive chronic kidney disease with stage 1 through stage 4 chronic kidney disease, or unspecified chronic kidney disease: Secondary | ICD-10-CM | POA: Diagnosis not present

## 2021-06-14 DIAGNOSIS — M419 Scoliosis, unspecified: Secondary | ICD-10-CM | POA: Diagnosis not present

## 2021-06-14 DIAGNOSIS — S72142D Displaced intertrochanteric fracture of left femur, subsequent encounter for closed fracture with routine healing: Secondary | ICD-10-CM | POA: Diagnosis not present

## 2021-06-14 DIAGNOSIS — F32A Depression, unspecified: Secondary | ICD-10-CM | POA: Diagnosis not present

## 2021-06-14 DIAGNOSIS — I471 Supraventricular tachycardia: Secondary | ICD-10-CM | POA: Diagnosis not present

## 2021-06-14 DIAGNOSIS — K219 Gastro-esophageal reflux disease without esophagitis: Secondary | ICD-10-CM | POA: Diagnosis not present

## 2021-06-14 DIAGNOSIS — N183 Chronic kidney disease, stage 3 unspecified: Secondary | ICD-10-CM | POA: Diagnosis not present

## 2021-06-14 DIAGNOSIS — Z86718 Personal history of other venous thrombosis and embolism: Secondary | ICD-10-CM | POA: Diagnosis not present

## 2021-06-14 DIAGNOSIS — A310 Pulmonary mycobacterial infection: Secondary | ICD-10-CM | POA: Diagnosis not present

## 2021-06-14 DIAGNOSIS — E039 Hypothyroidism, unspecified: Secondary | ICD-10-CM | POA: Diagnosis not present

## 2021-06-14 DIAGNOSIS — J449 Chronic obstructive pulmonary disease, unspecified: Secondary | ICD-10-CM | POA: Diagnosis not present

## 2021-06-15 DIAGNOSIS — S72002A Fracture of unspecified part of neck of left femur, initial encounter for closed fracture: Secondary | ICD-10-CM | POA: Diagnosis not present

## 2021-06-19 ENCOUNTER — Other Ambulatory Visit: Payer: Self-pay | Admitting: Physician Assistant

## 2021-06-19 DIAGNOSIS — E875 Hyperkalemia: Secondary | ICD-10-CM | POA: Diagnosis not present

## 2021-06-19 DIAGNOSIS — Z9181 History of falling: Secondary | ICD-10-CM | POA: Diagnosis not present

## 2021-06-19 DIAGNOSIS — J449 Chronic obstructive pulmonary disease, unspecified: Secondary | ICD-10-CM | POA: Diagnosis not present

## 2021-06-19 DIAGNOSIS — A310 Pulmonary mycobacterial infection: Secondary | ICD-10-CM | POA: Diagnosis not present

## 2021-06-19 DIAGNOSIS — I129 Hypertensive chronic kidney disease with stage 1 through stage 4 chronic kidney disease, or unspecified chronic kidney disease: Secondary | ICD-10-CM | POA: Diagnosis not present

## 2021-06-19 DIAGNOSIS — I471 Supraventricular tachycardia: Secondary | ICD-10-CM | POA: Diagnosis not present

## 2021-06-19 DIAGNOSIS — D62 Acute posthemorrhagic anemia: Secondary | ICD-10-CM | POA: Diagnosis not present

## 2021-06-19 DIAGNOSIS — S72142D Displaced intertrochanteric fracture of left femur, subsequent encounter for closed fracture with routine healing: Secondary | ICD-10-CM | POA: Diagnosis not present

## 2021-06-19 DIAGNOSIS — F32A Depression, unspecified: Secondary | ICD-10-CM | POA: Diagnosis not present

## 2021-06-19 DIAGNOSIS — K5909 Other constipation: Secondary | ICD-10-CM | POA: Diagnosis not present

## 2021-06-19 DIAGNOSIS — Z86718 Personal history of other venous thrombosis and embolism: Secondary | ICD-10-CM | POA: Diagnosis not present

## 2021-06-19 DIAGNOSIS — M419 Scoliosis, unspecified: Secondary | ICD-10-CM | POA: Diagnosis not present

## 2021-06-19 DIAGNOSIS — K219 Gastro-esophageal reflux disease without esophagitis: Secondary | ICD-10-CM | POA: Diagnosis not present

## 2021-06-19 DIAGNOSIS — E039 Hypothyroidism, unspecified: Secondary | ICD-10-CM | POA: Diagnosis not present

## 2021-06-19 DIAGNOSIS — N183 Chronic kidney disease, stage 3 unspecified: Secondary | ICD-10-CM | POA: Diagnosis not present

## 2021-06-19 DIAGNOSIS — E876 Hypokalemia: Secondary | ICD-10-CM | POA: Diagnosis not present

## 2021-06-21 ENCOUNTER — Other Ambulatory Visit: Payer: Medicare Other

## 2021-06-22 DIAGNOSIS — J449 Chronic obstructive pulmonary disease, unspecified: Secondary | ICD-10-CM | POA: Diagnosis not present

## 2021-06-22 DIAGNOSIS — F32A Depression, unspecified: Secondary | ICD-10-CM | POA: Diagnosis not present

## 2021-06-22 DIAGNOSIS — Z86718 Personal history of other venous thrombosis and embolism: Secondary | ICD-10-CM | POA: Diagnosis not present

## 2021-06-22 DIAGNOSIS — M419 Scoliosis, unspecified: Secondary | ICD-10-CM | POA: Diagnosis not present

## 2021-06-22 DIAGNOSIS — A310 Pulmonary mycobacterial infection: Secondary | ICD-10-CM | POA: Diagnosis not present

## 2021-06-22 DIAGNOSIS — K219 Gastro-esophageal reflux disease without esophagitis: Secondary | ICD-10-CM | POA: Diagnosis not present

## 2021-06-22 DIAGNOSIS — Z9181 History of falling: Secondary | ICD-10-CM | POA: Diagnosis not present

## 2021-06-22 DIAGNOSIS — I129 Hypertensive chronic kidney disease with stage 1 through stage 4 chronic kidney disease, or unspecified chronic kidney disease: Secondary | ICD-10-CM | POA: Diagnosis not present

## 2021-06-22 DIAGNOSIS — D62 Acute posthemorrhagic anemia: Secondary | ICD-10-CM | POA: Diagnosis not present

## 2021-06-22 DIAGNOSIS — E876 Hypokalemia: Secondary | ICD-10-CM | POA: Diagnosis not present

## 2021-06-22 DIAGNOSIS — I471 Supraventricular tachycardia: Secondary | ICD-10-CM | POA: Diagnosis not present

## 2021-06-22 DIAGNOSIS — E039 Hypothyroidism, unspecified: Secondary | ICD-10-CM | POA: Diagnosis not present

## 2021-06-22 DIAGNOSIS — K5909 Other constipation: Secondary | ICD-10-CM | POA: Diagnosis not present

## 2021-06-22 DIAGNOSIS — N183 Chronic kidney disease, stage 3 unspecified: Secondary | ICD-10-CM | POA: Diagnosis not present

## 2021-06-22 DIAGNOSIS — E875 Hyperkalemia: Secondary | ICD-10-CM | POA: Diagnosis not present

## 2021-06-22 DIAGNOSIS — S72142D Displaced intertrochanteric fracture of left femur, subsequent encounter for closed fracture with routine healing: Secondary | ICD-10-CM | POA: Diagnosis not present

## 2021-06-27 ENCOUNTER — Other Ambulatory Visit: Payer: Self-pay | Admitting: Physician Assistant

## 2021-06-28 DIAGNOSIS — F32A Depression, unspecified: Secondary | ICD-10-CM | POA: Diagnosis not present

## 2021-06-28 DIAGNOSIS — E876 Hypokalemia: Secondary | ICD-10-CM | POA: Diagnosis not present

## 2021-06-28 DIAGNOSIS — E875 Hyperkalemia: Secondary | ICD-10-CM | POA: Diagnosis not present

## 2021-06-28 DIAGNOSIS — A310 Pulmonary mycobacterial infection: Secondary | ICD-10-CM | POA: Diagnosis not present

## 2021-06-28 DIAGNOSIS — M419 Scoliosis, unspecified: Secondary | ICD-10-CM | POA: Diagnosis not present

## 2021-06-28 DIAGNOSIS — S72142D Displaced intertrochanteric fracture of left femur, subsequent encounter for closed fracture with routine healing: Secondary | ICD-10-CM | POA: Diagnosis not present

## 2021-06-28 DIAGNOSIS — I129 Hypertensive chronic kidney disease with stage 1 through stage 4 chronic kidney disease, or unspecified chronic kidney disease: Secondary | ICD-10-CM | POA: Diagnosis not present

## 2021-06-28 DIAGNOSIS — J449 Chronic obstructive pulmonary disease, unspecified: Secondary | ICD-10-CM | POA: Diagnosis not present

## 2021-06-28 DIAGNOSIS — N183 Chronic kidney disease, stage 3 unspecified: Secondary | ICD-10-CM | POA: Diagnosis not present

## 2021-06-28 DIAGNOSIS — I471 Supraventricular tachycardia: Secondary | ICD-10-CM | POA: Diagnosis not present

## 2021-06-28 DIAGNOSIS — K219 Gastro-esophageal reflux disease without esophagitis: Secondary | ICD-10-CM | POA: Diagnosis not present

## 2021-06-28 DIAGNOSIS — D62 Acute posthemorrhagic anemia: Secondary | ICD-10-CM | POA: Diagnosis not present

## 2021-06-28 DIAGNOSIS — K5909 Other constipation: Secondary | ICD-10-CM | POA: Diagnosis not present

## 2021-06-28 DIAGNOSIS — E039 Hypothyroidism, unspecified: Secondary | ICD-10-CM | POA: Diagnosis not present

## 2021-06-28 DIAGNOSIS — Z86718 Personal history of other venous thrombosis and embolism: Secondary | ICD-10-CM | POA: Diagnosis not present

## 2021-06-28 DIAGNOSIS — Z9181 History of falling: Secondary | ICD-10-CM | POA: Diagnosis not present

## 2021-06-29 ENCOUNTER — Encounter: Payer: Self-pay | Admitting: Physician Assistant

## 2021-06-29 ENCOUNTER — Ambulatory Visit (INDEPENDENT_AMBULATORY_CARE_PROVIDER_SITE_OTHER): Payer: Medicare Other | Admitting: Physician Assistant

## 2021-06-29 ENCOUNTER — Other Ambulatory Visit: Payer: Self-pay

## 2021-06-29 ENCOUNTER — Other Ambulatory Visit: Payer: Self-pay | Admitting: Family Medicine

## 2021-06-29 VITALS — BP 130/78 | HR 89 | Temp 97.5°F | Ht 59.0 in | Wt 72.0 lb

## 2021-06-29 DIAGNOSIS — M7989 Other specified soft tissue disorders: Secondary | ICD-10-CM | POA: Diagnosis not present

## 2021-06-29 DIAGNOSIS — Z23 Encounter for immunization: Secondary | ICD-10-CM

## 2021-06-29 DIAGNOSIS — I82402 Acute embolism and thrombosis of unspecified deep veins of left lower extremity: Secondary | ICD-10-CM | POA: Diagnosis not present

## 2021-06-29 DIAGNOSIS — R634 Abnormal weight loss: Secondary | ICD-10-CM | POA: Diagnosis not present

## 2021-06-29 DIAGNOSIS — M79605 Pain in left leg: Secondary | ICD-10-CM

## 2021-06-29 DIAGNOSIS — M7122 Synovial cyst of popliteal space [Baker], left knee: Secondary | ICD-10-CM | POA: Diagnosis not present

## 2021-06-29 NOTE — Progress Notes (Signed)
Subjective:  Patient ID: Stephanie Clay, female    DOB: 22-Mar-1932  Age: 87 y.o. MRN: [id number]  Chief Complaint  Patient presents with   knot on knee   Weight Loss    HPI  Pt states a knot came up on her left knee last night - states is painful and warm to touch Cannot recall any history of injury or trauma Did use voltaren gel on knee which seemed to help slightly Is having tenderness on side of left knee and behind knee as well Denies chest pain or dyspnea  Patient with history of hip fracture a few months ago and is having PT come out to work with her - they advised family to come in to discuss pt having weight loss.  I was able to print out past weight and she has ranged from 69-78 pounds in the past year - her last weight in July was 74 pounds - today is 88 Discussed this has been a chronic fluctuation for her.  She has not been able to tolerate mirapex or megace in the past.  She is drinking 2 protein shakes daily Current Outpatient Medications on File Prior to Visit  Medication Sig Dispense Refill   acetaminophen (TYLENOL) 325 MG tablet Take 650 mg by mouth every 6 (six) hours as needed for mild pain.     albuterol (VENTOLIN HFA) 108 (90 Base) MCG/ACT inhaler Inhale 2 puffs into the lungs every 6 (six) hours as needed for wheezing or shortness of breath. 8 g 6   ALPRAZolam (XANAX) 0.5 MG tablet Take 0.5 mg by mouth every 12 (twelve) hours as needed for anxiety.     aspirin 325 MG tablet Take 325 mg by mouth 3 (three) times daily before meals.     Budeson-Glycopyrrol-Formoterol (BREZTRI AEROSPHERE) 160-9-4.8 MCG/ACT AERO Inhale 2 puffs into the lungs in the morning and at bedtime. 10.7 g 3   Budeson-Glycopyrrol-Formoterol (BREZTRI AEROSPHERE) 160-9-4.8 MCG/ACT AERO Inhale 2 puffs into the lungs 2 (two) times daily. 10.7 g 2   Cholecalciferol (VITAMIN D3 PO) Take 2,000 Units by mouth daily.     Cyanocobalamin 2500 MCG TABS Take 2,500 mcg by mouth daily.      dicyclomine (BENTYL) 10 MG capsule TAKE 1 CAPSULE BY MOUTH BEFORE EACH MEAL AND AT BEDTIME AS NEEDED FOR COLON SPASM 120 capsule 0   diltiazem (CARDIZEM) 30 MG tablet Take 1 tablet (30 mg total) by mouth every 8 (eight) hours. 270 tablet 0   fluticasone (FLONASE) 50 MCG/ACT nasal spray Place 2 sprays into both nostrils as needed for allergies or rhinitis.     gabapentin (NEURONTIN) 300 MG capsule Take 1 capsule (300 mg total) by mouth 2 (two) times daily. 60 capsule 1   levothyroxine (SYNTHROID) 50 MCG tablet TAKE 1 TABLET BY MOUTH DAILY 90 tablet 0   metoprolol succinate (TOPROL-XL) 25 MG 24 hr tablet Take 1 tablet (25 mg total) by mouth daily. 90 tablet 0   nitroGLYCERIN (NITROSTAT) 0.4 MG SL tablet Place 0.4 mg under the tongue every 5 (five) minutes as needed for chest pain. X 3 doses     nystatin (MYCOSTATIN) 100000 UNIT/ML suspension Take 5 mLs (500,000 Units total) by mouth 3 (three) times daily. 60 mL 3   ondansetron (ZOFRAN-ODT) 4 MG disintegrating tablet Take 4 mg by mouth as needed for nausea/vomiting.     oxyCODONE (OXY IR/ROXICODONE) 5 MG immediate release tablet Take 5 mg by mouth every 4 (four) hours as needed for pain  or severe pain.     pantoprazole (PROTONIX) 40 MG tablet TAKE 1 TABLET TWICE DAILY 180 tablet 1   polyethylene glycol (MIRALAX / GLYCOLAX) 17 g packet Take 17 g by mouth 2 (two) times daily.     potassium chloride SA (KLOR-CON) 20 MEQ tablet TAKE 3 TABLETS BY MOUTH ONCE DAILY. 90 tablet 2   sennosides-docusate sodium (SENOKOT-S) 8.6-50 MG tablet Take 1 tablet by mouth 2 (two) times daily.     simvastatin (ZOCOR) 10 MG tablet Take 1 tablet (10 mg total) by mouth daily. cholesterol 90 tablet 0   venlafaxine XR (EFFEXOR-XR) 150 MG 24 hr capsule TAKE 1 CAPSULE BY MOUTH IN THE MORNING 90 capsule 0   No current facility-administered medications on file prior to visit.   Past Medical History:  Diagnosis Date   Abdominal pain 01/27/2018   AKI (acute kidney injury) (HCC)  06/30/2016   Allergic rhinitis 08/20/2016   Anemia    Aneurysm of other specified arteries (HCC)    Anxiety    Arthritis    Asthma    Bronchiectasis with evidence of chronic atypical infection 01/03/2015   CT chest 12/2014:  Bronchiectasis throughout right lung, left clear.  +tree and bud, + patchy gg infiltrates in RLL Sputum 2016: no growth, AFB smear neg PFT's 02/2015:  Normal except mild decrease in DLCO   Bronchiectasis without acute exacerbation (HCC) 01/03/2015   CT chest 12/2014:  Bronchiectasis throughout right lung, left clear.  +tree and bud, + patchy gg infiltrates in RLL Sputum 2016: no growth, AFB smear neg PFT's 02/2015:  Normal except mild decrease in DLCO    Cataract    bilateral eye surgery   Chronic bilateral low back pain without sciatica 11/29/2019   Chronic chest pain    Chronic idiopathic constipation    Chronic kidney disease, stage 3 (HCC)    Chronic venous hypertension (idiopathic) with other complications of bilateral lower extremity    Decreased pedal pulses 03/24/2020   Depression    Dysphagia 03/01/2015   Essential hypertension 07/01/2016   Gastro-esophageal reflux    Heart murmur    Hyperglycemia 07/01/2016   Hyperlipemia    Hyperlipidemia 07/01/2016   Hypertension    Hypokalemia 07/01/2016   Hypomagnesemia 07/01/2016   Hypothyroidism 07/01/2016   Idiopathic progressive neuropathy    Iron (Fe) deficiency anemia    Joint pain 03/24/2020   Localized edema 11/26/2019   Mild vitamin D deficiency 11/29/2019   Moderate protein-calorie malnutrition (HCC)    Normocytic anemia 01/07/2020   Other fatigue 11/26/2019   Other idiopathic scoliosis, lumbar region    Pneumathemia (HCC)    Primary generalized (osteo)arthritis    Primary insomnia    Radiculopathy, lumbar region    Scoliosis    Secondary hyperparathyroidism (HCC)    Severe protein-calorie malnutrition (HCC) 03/24/2020   Underweight 07/02/2016   Unilateral primary osteoarthritis, left knee    Unilateral primary  osteoarthritis, right knee    Past Surgical History:  Procedure Laterality Date   ABDOMINAL HYSTERECTOMY     CYSTOCELE REPAIR     SALPINGOOPHORECTOMY      Family History  Problem Relation Age of Onset   Emphysema Father    Heart disease Mother    Cancer Sister        metastatic lung cancer   Heart disease Sister    Hypertension Sister    Varicose Veins Sister    Hyperlipidemia Daughter    Hypertension Daughter    Diabetes Daughter  Hyperlipidemia Daughter    Hypertension Daughter    Thyroid disease Daughter    Thyroid disease Daughter    Colon cancer Neg Hx    Esophageal cancer Neg Hx    Stomach cancer Neg Hx    Social History   Socioeconomic History   Marital status: Widowed    Spouse name: carl   Number of children: 3   Years of education: 9   Highest education level: Not on file  Occupational History   Occupation: retired  Tobacco Use   Smoking status: Never   Smokeless tobacco: Never  Vaping Use   Vaping Use: Never used  Substance and Sexual Activity   Alcohol use: No    Alcohol/week: 0.0 standard drinks   Drug use: No   Sexual activity: Not Currently  Other Topics Concern   Not on file  Social History Narrative   Right handed   Social Determinants of Health   Financial Resource Strain: Not on file  Food Insecurity: No Food Insecurity   Worried About Running Out of Food in the Last Year: Never true   Ran Out of Food in the Last Year: Never true  Transportation Needs: No Transportation Needs   Lack of Transportation (Medical): No   Lack of Transportation (Non-Medical): No  Physical Activity: Not on file  Stress: Not on file  Social Connections: Not on file    Review of Systems CONSTITUTIONAL: see HPI E/N/T: Negative for ear pain, nasal congestion and sore throat.  CARDIOVASCULAR: Negative for chest pain, dizziness, palpitations and pedal edema.  RESPIRATORY: Negative for recent cough and dyspnea.  GASTROINTESTINAL: Negative for abdominal  pain, acid reflux symptoms, constipation, diarrhea, nausea and vomiting.  MSK: see HPI INTEGUMENTARY: Negative for rash.        Objective:  BP 130/78 (BP Location: Right Arm, Patient Position: Sitting, Cuff Size: Small)   Pulse 89   Temp (!) 97.5 F (36.4 C) (Temporal)   Ht 4\' 11"  (1.499 m)   Wt 72 lb (32.7 kg)   SpO2 92%   BMI 14.54 kg/m   BP/Weight 06/29/2021 05/22/2021 05/16/2021  Systolic BP 130 128 160  Diastolic BP 78 68 80  Wt. (Lbs) 72 77.2 74.2  BMI 14.54 15.59 14.99    Physical Exam PHYSICAL EXAM:   VS: BP 130/78 (BP Location: Right Arm, Patient Position: Sitting, Cuff Size: Small)   Pulse 89   Temp (!) 97.5 F (36.4 C) (Temporal)   Ht 4\' 11"  (1.499 m)   Wt 72 lb (32.7 kg)   SpO2 92%   BMI 14.54 kg/m   GEN: Well nourished, well developed, in no acute distress  Cardiac: RRR; no murmurs, rubs, or gallops,no edema -  Respiratory:  normal respiratory rate and pattern with no distress - normal breath sounds with no rales, rhonchi, wheezes or rubs MS: left knee with noteable swelling medially with warmth to joint noted Skin: warm and dry, no rash  Psych: euthymic mood, appropriate affect and demeanor  Diabetic Foot Exam - Simple   No data filed      Lab Results  Component Value Date   WBC 9.9 05/16/2021   HGB 10.7 (L) 05/16/2021   HCT 32.5 (L) 05/16/2021   PLT 295 05/16/2021   GLUCOSE 122 (H) 05/30/2021   CHOL 185 05/16/2021   TRIG 83 05/16/2021   HDL 71 05/16/2021   LDLCALC 99 05/16/2021   ALT 6 05/30/2021   AST 12 05/30/2021   NA 145 (H) 05/30/2021   K  4.2 05/30/2021   CL 103 05/30/2021   CREATININE 0.88 05/30/2021   BUN 25 05/30/2021   CO2 26 05/30/2021   TSH 3.740 05/16/2021   HGBA1C 5.5 03/08/2020      Assessment & Plan:   1. Weight loss - CBC with Differential/Platelet - Comprehensive metabolic panel - TSH Recommend to increase protein shakes to three times daily 2. Pain of left lower extremity - Uric acid - DOPPLER ARTERIAL LEG  LEFT Recommend to continue voltaren gel pending results 3. Need for prophylactic vaccination and inoculation against influenza - Flu Vaccine QUAD High Dose(Fluad)    No orders of the defined types were placed in this encounter.   Orders Placed This Encounter  Procedures   Flu Vaccine QUAD High Dose(Fluad)   CBC with Differential/Platelet   Comprehensive metabolic panel   TSH   Uric acid   DOPPLER ARTERIAL LEG LEFT     Follow-up: Return in about 2 weeks (around 07/13/2021) for with Dr cox for follow up.  An After Visit Summary was printed and given to the patient.  Jettie Pagan [facility] [phone]

## 2021-06-30 ENCOUNTER — Telehealth: Payer: Self-pay | Admitting: Nurse Practitioner

## 2021-06-30 LAB — COMPREHENSIVE METABOLIC PANEL
ALT: 7 IU/L (ref 0–32)
AST: 17 IU/L (ref 0–40)
Albumin/Globulin Ratio: 1.3 (ref 1.2–2.2)
Albumin: 4.3 g/dL (ref 3.6–4.6)
Alkaline Phosphatase: 103 IU/L (ref 44–121)
BUN/Creatinine Ratio: 32 — ABNORMAL HIGH (ref 12–28)
BUN: 26 mg/dL (ref 8–27)
Bilirubin Total: 0.2 mg/dL (ref 0.0–1.2)
CO2: 28 mmol/L (ref 20–29)
Calcium: 9.9 mg/dL (ref 8.7–10.3)
Chloride: 98 mmol/L (ref 96–106)
Creatinine, Ser: 0.82 mg/dL (ref 0.57–1.00)
Globulin, Total: 3.4 g/dL (ref 1.5–4.5)
Glucose: 76 mg/dL (ref 65–99)
Potassium: 4.4 mmol/L (ref 3.5–5.2)
Sodium: 141 mmol/L (ref 134–144)
Total Protein: 7.7 g/dL (ref 6.0–8.5)
eGFR: 69 mL/min/{1.73_m2} (ref >59)

## 2021-06-30 LAB — CBC WITH DIFFERENTIAL/PLATELET
Basophils Absolute: 0.1 10*3/uL (ref 0.0–0.2)
Basos: 0 %
EOS (ABSOLUTE): 0.4 10*3/uL (ref 0.0–0.4)
Eos: 4 %
Hematocrit: 34.7 % (ref 34.0–46.6)
Hemoglobin: 11.3 g/dL (ref 11.1–15.9)
Immature Grans (Abs): 0 10*3/uL (ref 0.0–0.1)
Immature Granulocytes: 0 %
Lymphocytes Absolute: 2.2 10*3/uL (ref 0.7–3.1)
Lymphs: 19 %
MCH: 29.6 pg (ref 26.6–33.0)
MCHC: 32.6 g/dL (ref 31.5–35.7)
MCV: 91 fL (ref 79–97)
Monocytes Absolute: 1 10*3/uL — ABNORMAL HIGH (ref 0.1–0.9)
Monocytes: 8 %
Neutrophils Absolute: 8 10*3/uL — ABNORMAL HIGH (ref 1.4–7.0)
Neutrophils: 69 %
Platelets: 302 10*3/uL (ref 150–450)
RBC: 3.82 x10E6/uL (ref 3.77–5.28)
RDW: 12.2 % (ref 11.7–15.4)
WBC: 11.7 10*3/uL — ABNORMAL HIGH (ref 3.4–10.8)

## 2021-06-30 LAB — TSH: TSH: 6.08 u[IU]/mL — ABNORMAL HIGH (ref 0.450–4.500)

## 2021-06-30 LAB — URIC ACID: Uric Acid: 4.9 mg/dL (ref 3.1–7.9)

## 2021-06-30 NOTE — Telephone Encounter (Signed)
No DVT noted by [place]. Incidental finding of small Baker's cyst. Pt's daughter notified via phone.

## 2021-07-03 ENCOUNTER — Encounter: Payer: Self-pay | Admitting: Family Medicine

## 2021-07-03 ENCOUNTER — Other Ambulatory Visit: Payer: Self-pay | Admitting: Physician Assistant

## 2021-07-03 DIAGNOSIS — E038 Other specified hypothyroidism: Secondary | ICD-10-CM

## 2021-07-03 MED ORDER — LEVOTHYROXINE SODIUM 75 MCG PO TABS: 75.0000 ug | ORAL_TABLET | Freq: Every day | ORAL | 3 refills | Status: AC

## 2021-07-04 ENCOUNTER — Other Ambulatory Visit: Payer: Self-pay

## 2021-07-04 ENCOUNTER — Ambulatory Visit (INDEPENDENT_AMBULATORY_CARE_PROVIDER_SITE_OTHER): Payer: Medicare Other

## 2021-07-04 DIAGNOSIS — Z86718 Personal history of other venous thrombosis and embolism: Secondary | ICD-10-CM | POA: Diagnosis not present

## 2021-07-04 DIAGNOSIS — A310 Pulmonary mycobacterial infection: Secondary | ICD-10-CM | POA: Diagnosis not present

## 2021-07-04 DIAGNOSIS — M419 Scoliosis, unspecified: Secondary | ICD-10-CM | POA: Diagnosis not present

## 2021-07-04 DIAGNOSIS — K219 Gastro-esophageal reflux disease without esophagitis: Secondary | ICD-10-CM | POA: Diagnosis not present

## 2021-07-04 DIAGNOSIS — J449 Chronic obstructive pulmonary disease, unspecified: Secondary | ICD-10-CM | POA: Diagnosis not present

## 2021-07-04 DIAGNOSIS — N183 Chronic kidney disease, stage 3 unspecified: Secondary | ICD-10-CM | POA: Diagnosis not present

## 2021-07-04 DIAGNOSIS — D62 Acute posthemorrhagic anemia: Secondary | ICD-10-CM | POA: Diagnosis not present

## 2021-07-04 DIAGNOSIS — I10 Essential (primary) hypertension: Secondary | ICD-10-CM

## 2021-07-04 DIAGNOSIS — R61 Generalized hyperhidrosis: Secondary | ICD-10-CM

## 2021-07-04 DIAGNOSIS — E039 Hypothyroidism, unspecified: Secondary | ICD-10-CM | POA: Diagnosis not present

## 2021-07-04 DIAGNOSIS — I471 Supraventricular tachycardia: Secondary | ICD-10-CM | POA: Diagnosis not present

## 2021-07-04 DIAGNOSIS — E876 Hypokalemia: Secondary | ICD-10-CM | POA: Diagnosis not present

## 2021-07-04 DIAGNOSIS — F32A Depression, unspecified: Secondary | ICD-10-CM | POA: Diagnosis not present

## 2021-07-04 DIAGNOSIS — Z9181 History of falling: Secondary | ICD-10-CM | POA: Diagnosis not present

## 2021-07-04 DIAGNOSIS — I129 Hypertensive chronic kidney disease with stage 1 through stage 4 chronic kidney disease, or unspecified chronic kidney disease: Secondary | ICD-10-CM | POA: Diagnosis not present

## 2021-07-04 DIAGNOSIS — S72142D Displaced intertrochanteric fracture of left femur, subsequent encounter for closed fracture with routine healing: Secondary | ICD-10-CM | POA: Diagnosis not present

## 2021-07-04 DIAGNOSIS — K5909 Other constipation: Secondary | ICD-10-CM | POA: Diagnosis not present

## 2021-07-04 DIAGNOSIS — E875 Hyperkalemia: Secondary | ICD-10-CM | POA: Diagnosis not present

## 2021-07-04 LAB — ECHOCARDIOGRAM COMPLETE
Area-P 1/2: 3.7 cm2
P 1/2 time: 549 msec
S' Lateral: 2.4 cm

## 2021-07-06 ENCOUNTER — Telehealth: Payer: Self-pay

## 2021-07-06 NOTE — Telephone Encounter (Signed)
Home Health PT calling for verbal orders. Requests schedule once a week for one wee, twice a week for three weeks, then once a week for five weeks. PT also states pt's appetite is not the best therefore she is not eating. He did not know if anything needed or could be done to help this so pt could gain strength. Please advise if could help pt's appetite.   Repeated and gave verbal orders for schedule requested.   [name] 07/06/21 8:47 AM

## 2021-07-10 DIAGNOSIS — S72142D Displaced intertrochanteric fracture of left femur, subsequent encounter for closed fracture with routine healing: Secondary | ICD-10-CM | POA: Diagnosis not present

## 2021-07-10 DIAGNOSIS — A310 Pulmonary mycobacterial infection: Secondary | ICD-10-CM | POA: Diagnosis not present

## 2021-07-10 DIAGNOSIS — I471 Supraventricular tachycardia: Secondary | ICD-10-CM | POA: Diagnosis not present

## 2021-07-10 DIAGNOSIS — M419 Scoliosis, unspecified: Secondary | ICD-10-CM | POA: Diagnosis not present

## 2021-07-10 DIAGNOSIS — J449 Chronic obstructive pulmonary disease, unspecified: Secondary | ICD-10-CM | POA: Diagnosis not present

## 2021-07-10 DIAGNOSIS — N183 Chronic kidney disease, stage 3 unspecified: Secondary | ICD-10-CM | POA: Diagnosis not present

## 2021-07-10 DIAGNOSIS — K5909 Other constipation: Secondary | ICD-10-CM | POA: Diagnosis not present

## 2021-07-10 DIAGNOSIS — E875 Hyperkalemia: Secondary | ICD-10-CM | POA: Diagnosis not present

## 2021-07-10 DIAGNOSIS — E876 Hypokalemia: Secondary | ICD-10-CM | POA: Diagnosis not present

## 2021-07-10 DIAGNOSIS — K219 Gastro-esophageal reflux disease without esophagitis: Secondary | ICD-10-CM | POA: Diagnosis not present

## 2021-07-10 DIAGNOSIS — Z9181 History of falling: Secondary | ICD-10-CM | POA: Diagnosis not present

## 2021-07-10 DIAGNOSIS — E039 Hypothyroidism, unspecified: Secondary | ICD-10-CM | POA: Diagnosis not present

## 2021-07-10 DIAGNOSIS — I129 Hypertensive chronic kidney disease with stage 1 through stage 4 chronic kidney disease, or unspecified chronic kidney disease: Secondary | ICD-10-CM | POA: Diagnosis not present

## 2021-07-10 DIAGNOSIS — F32A Depression, unspecified: Secondary | ICD-10-CM | POA: Diagnosis not present

## 2021-07-10 DIAGNOSIS — D62 Acute posthemorrhagic anemia: Secondary | ICD-10-CM | POA: Diagnosis not present

## 2021-07-10 DIAGNOSIS — Z86718 Personal history of other venous thrombosis and embolism: Secondary | ICD-10-CM | POA: Diagnosis not present

## 2021-07-12 ENCOUNTER — Other Ambulatory Visit: Payer: Self-pay | Admitting: Legal Medicine

## 2021-07-13 ENCOUNTER — Other Ambulatory Visit: Payer: Self-pay

## 2021-07-13 ENCOUNTER — Ambulatory Visit (INDEPENDENT_AMBULATORY_CARE_PROVIDER_SITE_OTHER): Payer: Medicare Other | Admitting: Family Medicine

## 2021-07-13 VITALS — BP 132/72 | HR 68 | Temp 97.3°F | Resp 16 | Ht 59.0 in | Wt 71.6 lb

## 2021-07-13 DIAGNOSIS — B370 Candidal stomatitis: Secondary | ICD-10-CM

## 2021-07-13 DIAGNOSIS — E43 Unspecified severe protein-calorie malnutrition: Secondary | ICD-10-CM | POA: Diagnosis not present

## 2021-07-13 DIAGNOSIS — G250 Essential tremor: Secondary | ICD-10-CM

## 2021-07-13 DIAGNOSIS — J471 Bronchiectasis with (acute) exacerbation: Secondary | ICD-10-CM | POA: Diagnosis not present

## 2021-07-13 DIAGNOSIS — B3783 Candidal cheilitis: Secondary | ICD-10-CM

## 2021-07-13 DIAGNOSIS — J4541 Moderate persistent asthma with (acute) exacerbation: Secondary | ICD-10-CM | POA: Diagnosis not present

## 2021-07-13 MED ORDER — PROPRANOLOL HCL 10 MG PO TABS: 10.0000 mg | ORAL_TABLET | Freq: Two times a day (BID) | ORAL | 1 refills | Status: DC

## 2021-07-13 MED ORDER — PREDNISONE 10 MG PO TABS: 10.0000 mg | ORAL_TABLET | Freq: Every day | ORAL | 0 refills | Status: DC

## 2021-07-13 MED ORDER — BREZTRI AEROSPHERE 160-9-4.8 MCG/ACT IN AERO: 2.0000 | INHALATION_SPRAY | Freq: Two times a day (BID) | RESPIRATORY_TRACT | 2 refills | Status: DC

## 2021-07-13 MED ORDER — ALPRAZOLAM 0.5 MG PO TABS: 0.5000 mg | ORAL_TABLET | Freq: Every day | ORAL | 2 refills | Status: DC

## 2021-07-13 MED ORDER — KETOCONAZOLE 2 % EX CREA: TOPICAL_CREAM | CUTANEOUS | 0 refills | Status: DC

## 2021-07-13 NOTE — Progress Notes (Signed)
Subjective:  Patient ID: Stephanie Clay, female    DOB: 1932/02/09  Age: 87 y.o. MRN: [id number]  Chief Complaint  Patient presents with   severe calorie malnutrition    HPI Severe calorie malnutrition Eats 3 times a day. Eats until full. Ensure or boost 2-3 per day.  Patient is weight is 71 pounds.  Is failing to thrive.  She is weak.  Frequently tired.  Home health care is coming out and doing physical therapy.  They had noted her weight and were concerned appropriately so however should her weight has been down in the 70s for some time.  Patient denies depression.  Has 3 daughters.  One stays with her at all times.  Pt is convinced she has thrush although the nystatin swish and spit she has only helped initially. Sores in her mouth.  Patient had CT scan of her chest early in 2022 and was concerning for MAI infection.  I have spoken with Dr. Delton Coombes and in addition to that she has seen him.  He has recommended to get a sputum prior to treating due to the toxicity of the medicines used to treat this.  Patient has been unable to produce a sputum in the last 6 months.  Current Outpatient Medications on File Prior to Visit  Medication Sig Dispense Refill   acetaminophen (TYLENOL) 325 MG tablet Take 650 mg by mouth every 6 (six) hours as needed for mild pain.     albuterol (VENTOLIN HFA) 108 (90 Base) MCG/ACT inhaler Inhale 2 puffs into the lungs every 6 (six) hours as needed for wheezing or shortness of breath. 8 g 6   aspirin 325 MG tablet Take 325 mg by mouth 3 (three) times daily before meals.     Cholecalciferol (VITAMIN D3 PO) Take 2,000 Units by mouth daily.     Cyanocobalamin 2500 MCG TABS Take 2,500 mcg by mouth daily.     dicyclomine (BENTYL) 10 MG capsule TAKE 1 CAPSULE BY MOUTH BEFORE EACH MEAL AND AT BEDTIME AS NEEDED FOR COLON SPASM 120 capsule 0   diltiazem (CARDIZEM) 30 MG tablet Take 1 tablet (30 mg total) by mouth every 8 (eight) hours. 270 tablet 0   fluticasone  (FLONASE) 50 MCG/ACT nasal spray Place 2 sprays into both nostrils as needed for allergies or rhinitis.     gabapentin (NEURONTIN) 300 MG capsule Take 1 capsule (300 mg total) by mouth 2 (two) times daily. 60 capsule 1   levothyroxine (SYNTHROID) 75 MCG tablet Take 1 tablet (75 mcg total) by mouth daily. 90 tablet 3   nitroGLYCERIN (NITROSTAT) 0.4 MG SL tablet Place 0.4 mg under the tongue every 5 (five) minutes as needed for chest pain. X 3 doses     nystatin (MYCOSTATIN) 100000 UNIT/ML suspension Take 5 mLs (500,000 Units total) by mouth 3 (three) times daily. 60 mL 3   ondansetron (ZOFRAN-ODT) 4 MG disintegrating tablet Take 4 mg by mouth as needed for nausea/vomiting.     pantoprazole (PROTONIX) 40 MG tablet TAKE 1 TABLET TWICE DAILY 180 tablet 1   polyethylene glycol (MIRALAX / GLYCOLAX) 17 g packet Take 17 g by mouth 2 (two) times daily.     potassium chloride SA (KLOR-CON) 20 MEQ tablet TAKE 3 TABLETS BY MOUTH ONCE DAILY. 90 tablet 2   sennosides-docusate sodium (SENOKOT-S) 8.6-50 MG tablet Take 1 tablet by mouth 2 (two) times daily.     simvastatin (ZOCOR) 10 MG tablet Take 1 tablet (10 mg total) by mouth  daily. cholesterol 90 tablet 0   venlafaxine XR (EFFEXOR-XR) 150 MG 24 hr capsule TAKE 1 CAPSULE BY MOUTH IN THE MORNING 90 capsule 0   No current facility-administered medications on file prior to visit.   Past Medical History:  Diagnosis Date   Abdominal pain 01/27/2018   AKI (acute kidney injury) (HCC) 06/30/2016   Allergic rhinitis 08/20/2016   Anemia    Aneurysm of other specified arteries (HCC)    Anxiety    Arthritis    Asthma    Bronchiectasis with evidence of chronic atypical infection 01/03/2015   CT chest 12/2014:  Bronchiectasis throughout right lung, left clear.  +tree and bud, + patchy gg infiltrates in RLL Sputum 2016: no growth, AFB smear neg PFT's 02/2015:  Normal except mild decrease in DLCO   Bronchiectasis without acute exacerbation (HCC) 01/03/2015   CT chest  12/2014:  Bronchiectasis throughout right lung, left clear.  +tree and bud, + patchy gg infiltrates in RLL Sputum 2016: no growth, AFB smear neg PFT's 02/2015:  Normal except mild decrease in DLCO    Cataract    bilateral eye surgery   Chronic bilateral low back pain without sciatica 11/29/2019   Chronic chest pain    Chronic idiopathic constipation    Chronic kidney disease, stage 3 (HCC)    Chronic venous hypertension (idiopathic) with other complications of bilateral lower extremity    Decreased pedal pulses 03/24/2020   Depression    Dysphagia 03/01/2015   Essential hypertension 07/01/2016   Gastro-esophageal reflux    Heart murmur    Hyperglycemia 07/01/2016   Hyperlipemia    Hyperlipidemia 07/01/2016   Hypertension    Hypokalemia 07/01/2016   Hypomagnesemia 07/01/2016   Hypothyroidism 07/01/2016   Idiopathic progressive neuropathy    Iron (Fe) deficiency anemia    Joint pain 03/24/2020   Localized edema 11/26/2019   Mild vitamin D deficiency 11/29/2019   Moderate protein-calorie malnutrition (HCC)    Normocytic anemia 01/07/2020   Other fatigue 11/26/2019   Other idiopathic scoliosis, lumbar region    Pneumathemia (HCC)    Primary generalized (osteo)arthritis    Primary insomnia    Radiculopathy, lumbar region    Scoliosis    Secondary hyperparathyroidism (HCC)    Severe protein-calorie malnutrition (HCC) 03/24/2020   Underweight 07/02/2016   Unilateral primary osteoarthritis, left knee    Unilateral primary osteoarthritis, right knee    Past Surgical History:  Procedure Laterality Date   ABDOMINAL HYSTERECTOMY     CYSTOCELE REPAIR     SALPINGOOPHORECTOMY      Family History  Problem Relation Age of Onset   Emphysema Father    Heart disease Mother    Cancer Sister        metastatic lung cancer   Heart disease Sister    Hypertension Sister    Varicose Veins Sister    Hyperlipidemia Daughter    Hypertension Daughter    Diabetes Daughter    Hyperlipidemia Daughter     Hypertension Daughter    Thyroid disease Daughter    Thyroid disease Daughter    Colon cancer Neg Hx    Esophageal cancer Neg Hx    Stomach cancer Neg Hx    Social History   Socioeconomic History   Marital status: Widowed    Spouse name: carl   Number of children: 3   Years of education: 9   Highest education level: Not on file  Occupational History   Occupation: retired  Tobacco Use   Smoking status:  Never   Smokeless tobacco: Never  Vaping Use   Vaping Use: Never used  Substance and Sexual Activity   Alcohol use: No    Alcohol/week: 0.0 standard drinks   Drug use: No   Sexual activity: Not Currently  Other Topics Concern   Not on file  Social History Narrative   Right handed   Social Determinants of Health   Financial Resource Strain: Not on file  Food Insecurity: No Food Insecurity   Worried About Running Out of Food in the Last Year: Never true   Ran Out of Food in the Last Year: Never true  Transportation Needs: No Transportation Needs   Lack of Transportation (Medical): No   Lack of Transportation (Non-Medical): No  Physical Activity: Not on file  Stress: Not on file  Social Connections: Not on file    Review of Systems  Constitutional:  Positive for fatigue. Negative for chills and fever.  HENT:  Positive for mouth sores and sore throat. Negative for congestion and rhinorrhea.   Respiratory:  Positive for shortness of breath. Negative for cough.   Cardiovascular:  Negative for chest pain.  Gastrointestinal:  Positive for abdominal pain and nausea. Negative for constipation, diarrhea and vomiting.  Genitourinary:  Negative for dysuria and urgency.  Musculoskeletal:  Positive for arthralgias, back pain and myalgias.  Neurological:  Positive for tremors. Negative for dizziness, weakness, light-headedness and headaches.  Psychiatric/Behavioral:  Negative for dysphoric mood. The patient is not nervous/anxious.     Objective:  BP 132/72   Pulse 68   Temp  (!) 97.3 F (36.3 C)   Resp 16   Ht 4\' 11"  (1.499 m)   Wt 71 lb 9.6 oz (32.5 kg)   BMI 14.46 kg/m   BP/Weight 07/13/2021 06/29/2021 05/22/2021  Systolic BP 132 130 128  Diastolic BP 72 78 68  Wt. (Lbs) 71.6 72 77.2  BMI 14.46 14.54 15.59    Physical Exam Vitals reviewed.  Constitutional:      Appearance: Normal appearance.     Comments: Thin.  HENT:     Mouth/Throat:     Comments: Erythematous rash on her chin in triangular shape pointing towards the tip of her chin. Tongue mildly erythematous. No obvious apthous ulcers.  Neck:     Vascular: No carotid bruit.  Cardiovascular:     Rate and Rhythm: Normal rate and regular rhythm.     Pulses: Normal pulses.     Heart sounds: Normal heart sounds.  Pulmonary:     Effort: Pulmonary effort is normal. No respiratory distress.     Breath sounds: Wheezing (diffusely. poor air exchange.) present.  Abdominal:     General: Abdomen is flat. Bowel sounds are normal.     Palpations: Abdomen is soft.     Tenderness: There is no abdominal tenderness.  Neurological:     Mental Status: She is alert and oriented to person, place, and time.     Comments: Dysmetria. In a wheelchair.   Psychiatric:        Mood and Affect: Mood normal.        Behavior: Behavior normal.    Diabetic Foot Exam - Simple   No data filed      Lab Results  Component Value Date   WBC 11.7 (H) 06/29/2021   HGB 11.3 06/29/2021   HCT 34.7 06/29/2021   PLT 302 06/29/2021   GLUCOSE 76 06/29/2021   CHOL 185 05/16/2021   TRIG 83 05/16/2021   HDL 71 05/16/2021  LDLCALC 99 05/16/2021   ALT 7 06/29/2021   AST 17 06/29/2021   NA 141 06/29/2021   K 4.4 06/29/2021   CL 98 06/29/2021   CREATININE 0.82 06/29/2021   BUN 26 06/29/2021   CO2 28 06/29/2021   TSH 6.080 (H) 06/29/2021   HGBA1C 5.5 03/08/2020      Assessment & Plan:   Problem List Items Addressed This Visit       Respiratory   Bronchiectasis with evidence of chronic atypical infection    See  overview.  CT of the chest was again current concerning for bronchiectasis.   Pulmonology has been reluctant to treat empirically for MAI. Pt has been unable to get a sputum.      Asthma exacerbation    Continue Breztri 2 puffs twice a day. Continue to rinse mouth out after use.  Start prednisone 10 mg daily x 10 days.  If yeast recurs or "gets worse in mouth", use nystatin swish.        Relevant Medications   Budeson-Glycopyrrol-Formoterol (BREZTRI AEROSPHERE) 160-9-4.8 MCG/ACT AERO   predniSONE (DELTASONE) 10 MG tablet     Digestive   Angular cheilitis with candidiasis    Angular cheilitis (rash on chin and corners of your mouth.) Nizoral cream apply small amount twice daily.   If yeast gets worse in mouth, use nystatin swish.        Relevant Medications   ketoconazole (NIZORAL) 2 % cream     Nervous and Auditory   Benign essential tremor - Primary    Tremor: Stop metoprolol xl.  Start propranolol 10 mg one twice a day.          Other   Severe protein-calorie malnutrition (HCC)  .  Meds ordered this encounter  Medications   Budeson-Glycopyrrol-Formoterol (BREZTRI AEROSPHERE) 160-9-4.8 MCG/ACT AERO    Sig: Inhale 2 puffs into the lungs 2 (two) times daily.    Dispense:  10.7 g    Refill:  2   ketoconazole (NIZORAL) 2 % cream    Sig: Twice daily to chin and corners of her mouth    Dispense:  15 g    Refill:  0   propranolol (INDERAL) 10 MG tablet    Sig: Take 1 tablet (10 mg total) by mouth 2 (two) times daily.    Dispense:  60 tablet    Refill:  1   predniSONE (DELTASONE) 10 MG tablet    Sig: Take 1 tablet (10 mg total) by mouth daily with breakfast.    Dispense:  10 tablet    Refill:  0   ALPRAZolam (XANAX) 0.5 MG tablet    Sig: Take 1 tablet (0.5 mg total) by mouth at bedtime.    Dispense:  30 tablet    Refill:  2    No orders of the defined types were placed in this encounter.    Follow-up: Return in about 1 month (around 08/12/2021).  An  After Visit Summary was printed and given to the patient.  Blane Ohara, MD [facility] [phone]

## 2021-07-13 NOTE — Patient Instructions (Addendum)
Tremor: Stop metoprolol xl.  Start propranolol 10 mg one twice a day.   Wheezing: Continue Breztri 2 puffs twice a day.  Start prednisone 10 mg daily x 10 days.   Angular cheilitis (rash on chin and corners of your mouth.) Nizoral cream apply small amount twice daily.   If yeast gets worse in mouth, use nystatin swish.

## 2021-07-15 ENCOUNTER — Other Ambulatory Visit: Payer: Self-pay | Admitting: Legal Medicine

## 2021-07-17 ENCOUNTER — Other Ambulatory Visit: Payer: Self-pay | Admitting: Family Medicine

## 2021-07-18 DIAGNOSIS — K5909 Other constipation: Secondary | ICD-10-CM | POA: Diagnosis not present

## 2021-07-18 DIAGNOSIS — K219 Gastro-esophageal reflux disease without esophagitis: Secondary | ICD-10-CM | POA: Diagnosis not present

## 2021-07-18 DIAGNOSIS — D62 Acute posthemorrhagic anemia: Secondary | ICD-10-CM | POA: Diagnosis not present

## 2021-07-18 DIAGNOSIS — M419 Scoliosis, unspecified: Secondary | ICD-10-CM | POA: Diagnosis not present

## 2021-07-18 DIAGNOSIS — I471 Supraventricular tachycardia: Secondary | ICD-10-CM | POA: Diagnosis not present

## 2021-07-18 DIAGNOSIS — E039 Hypothyroidism, unspecified: Secondary | ICD-10-CM | POA: Diagnosis not present

## 2021-07-18 DIAGNOSIS — E875 Hyperkalemia: Secondary | ICD-10-CM | POA: Diagnosis not present

## 2021-07-18 DIAGNOSIS — E876 Hypokalemia: Secondary | ICD-10-CM | POA: Diagnosis not present

## 2021-07-18 DIAGNOSIS — Z9181 History of falling: Secondary | ICD-10-CM | POA: Diagnosis not present

## 2021-07-18 DIAGNOSIS — A310 Pulmonary mycobacterial infection: Secondary | ICD-10-CM | POA: Diagnosis not present

## 2021-07-18 DIAGNOSIS — N183 Chronic kidney disease, stage 3 unspecified: Secondary | ICD-10-CM | POA: Diagnosis not present

## 2021-07-18 DIAGNOSIS — F32A Depression, unspecified: Secondary | ICD-10-CM | POA: Diagnosis not present

## 2021-07-18 DIAGNOSIS — J449 Chronic obstructive pulmonary disease, unspecified: Secondary | ICD-10-CM | POA: Diagnosis not present

## 2021-07-18 DIAGNOSIS — S72142D Displaced intertrochanteric fracture of left femur, subsequent encounter for closed fracture with routine healing: Secondary | ICD-10-CM | POA: Diagnosis not present

## 2021-07-18 DIAGNOSIS — I129 Hypertensive chronic kidney disease with stage 1 through stage 4 chronic kidney disease, or unspecified chronic kidney disease: Secondary | ICD-10-CM | POA: Diagnosis not present

## 2021-07-18 DIAGNOSIS — Z86718 Personal history of other venous thrombosis and embolism: Secondary | ICD-10-CM | POA: Diagnosis not present

## 2021-07-20 DIAGNOSIS — E876 Hypokalemia: Secondary | ICD-10-CM | POA: Diagnosis not present

## 2021-07-20 DIAGNOSIS — D62 Acute posthemorrhagic anemia: Secondary | ICD-10-CM | POA: Diagnosis not present

## 2021-07-20 DIAGNOSIS — A310 Pulmonary mycobacterial infection: Secondary | ICD-10-CM | POA: Diagnosis not present

## 2021-07-20 DIAGNOSIS — I129 Hypertensive chronic kidney disease with stage 1 through stage 4 chronic kidney disease, or unspecified chronic kidney disease: Secondary | ICD-10-CM | POA: Diagnosis not present

## 2021-07-20 DIAGNOSIS — M419 Scoliosis, unspecified: Secondary | ICD-10-CM | POA: Diagnosis not present

## 2021-07-20 DIAGNOSIS — K5909 Other constipation: Secondary | ICD-10-CM | POA: Diagnosis not present

## 2021-07-20 DIAGNOSIS — S72142D Displaced intertrochanteric fracture of left femur, subsequent encounter for closed fracture with routine healing: Secondary | ICD-10-CM | POA: Diagnosis not present

## 2021-07-20 DIAGNOSIS — E875 Hyperkalemia: Secondary | ICD-10-CM | POA: Diagnosis not present

## 2021-07-20 DIAGNOSIS — Z86718 Personal history of other venous thrombosis and embolism: Secondary | ICD-10-CM | POA: Diagnosis not present

## 2021-07-20 DIAGNOSIS — E039 Hypothyroidism, unspecified: Secondary | ICD-10-CM | POA: Diagnosis not present

## 2021-07-20 DIAGNOSIS — F32A Depression, unspecified: Secondary | ICD-10-CM | POA: Diagnosis not present

## 2021-07-20 DIAGNOSIS — N183 Chronic kidney disease, stage 3 unspecified: Secondary | ICD-10-CM | POA: Diagnosis not present

## 2021-07-20 DIAGNOSIS — K219 Gastro-esophageal reflux disease without esophagitis: Secondary | ICD-10-CM | POA: Diagnosis not present

## 2021-07-20 DIAGNOSIS — Z9181 History of falling: Secondary | ICD-10-CM | POA: Diagnosis not present

## 2021-07-20 DIAGNOSIS — J449 Chronic obstructive pulmonary disease, unspecified: Secondary | ICD-10-CM | POA: Diagnosis not present

## 2021-07-20 DIAGNOSIS — I471 Supraventricular tachycardia: Secondary | ICD-10-CM | POA: Diagnosis not present

## 2021-07-23 ENCOUNTER — Encounter: Payer: Self-pay | Admitting: Family Medicine

## 2021-07-23 DIAGNOSIS — G250 Essential tremor: Secondary | ICD-10-CM | POA: Insufficient documentation

## 2021-07-23 DIAGNOSIS — B3783 Candidal cheilitis: Secondary | ICD-10-CM

## 2021-07-23 HISTORY — DX: Essential tremor: G25.0

## 2021-07-23 HISTORY — DX: Candidal cheilitis: B37.83

## 2021-07-23 NOTE — Assessment & Plan Note (Signed)
Continue Breztri 2 puffs twice a day. Continue to rinse mouth out after use.  Start prednisone 10 mg daily x 10 days.  If yeast recurs or "gets worse in mouth", use nystatin swish.

## 2021-07-23 NOTE — Assessment & Plan Note (Signed)
See overview.  CT of the chest was again current concerning for bronchiectasis.   Pulmonology has been reluctant to treat empirically for MAI. Pt has been unable to get a sputum.

## 2021-07-23 NOTE — Assessment & Plan Note (Signed)
Angular cheilitis (rash on chin and corners of your mouth.) Nizoral cream apply small amount twice daily.   If yeast gets worse in mouth, use nystatin swish.

## 2021-07-23 NOTE — Assessment & Plan Note (Signed)
Tremor: Stop metoprolol xl.  Start propranolol 10 mg one twice a day.

## 2021-07-23 NOTE — Assessment & Plan Note (Addendum)
Encourage 3 protein drinks per day. Recommend 3 meals per day.  If patient is unable to eat a full meal recommend grazing on nutritious foods high in protein throughout the day.

## 2021-07-24 ENCOUNTER — Other Ambulatory Visit: Payer: Self-pay | Admitting: Family Medicine

## 2021-07-24 DIAGNOSIS — J449 Chronic obstructive pulmonary disease, unspecified: Secondary | ICD-10-CM | POA: Diagnosis not present

## 2021-07-24 DIAGNOSIS — K219 Gastro-esophageal reflux disease without esophagitis: Secondary | ICD-10-CM | POA: Diagnosis not present

## 2021-07-24 DIAGNOSIS — K5909 Other constipation: Secondary | ICD-10-CM | POA: Diagnosis not present

## 2021-07-24 DIAGNOSIS — I471 Supraventricular tachycardia: Secondary | ICD-10-CM | POA: Diagnosis not present

## 2021-07-24 DIAGNOSIS — E876 Hypokalemia: Secondary | ICD-10-CM | POA: Diagnosis not present

## 2021-07-24 DIAGNOSIS — D62 Acute posthemorrhagic anemia: Secondary | ICD-10-CM | POA: Diagnosis not present

## 2021-07-24 DIAGNOSIS — N183 Chronic kidney disease, stage 3 unspecified: Secondary | ICD-10-CM | POA: Diagnosis not present

## 2021-07-24 DIAGNOSIS — E039 Hypothyroidism, unspecified: Secondary | ICD-10-CM | POA: Diagnosis not present

## 2021-07-24 DIAGNOSIS — M419 Scoliosis, unspecified: Secondary | ICD-10-CM | POA: Diagnosis not present

## 2021-07-24 DIAGNOSIS — F32A Depression, unspecified: Secondary | ICD-10-CM | POA: Diagnosis not present

## 2021-07-24 DIAGNOSIS — E875 Hyperkalemia: Secondary | ICD-10-CM | POA: Diagnosis not present

## 2021-07-24 DIAGNOSIS — A310 Pulmonary mycobacterial infection: Secondary | ICD-10-CM | POA: Diagnosis not present

## 2021-07-24 DIAGNOSIS — Z9181 History of falling: Secondary | ICD-10-CM | POA: Diagnosis not present

## 2021-07-24 DIAGNOSIS — I129 Hypertensive chronic kidney disease with stage 1 through stage 4 chronic kidney disease, or unspecified chronic kidney disease: Secondary | ICD-10-CM | POA: Diagnosis not present

## 2021-07-24 DIAGNOSIS — Z86718 Personal history of other venous thrombosis and embolism: Secondary | ICD-10-CM | POA: Diagnosis not present

## 2021-07-24 DIAGNOSIS — S72142D Displaced intertrochanteric fracture of left femur, subsequent encounter for closed fracture with routine healing: Secondary | ICD-10-CM | POA: Diagnosis not present

## 2021-07-28 DIAGNOSIS — S72142D Displaced intertrochanteric fracture of left femur, subsequent encounter for closed fracture with routine healing: Secondary | ICD-10-CM | POA: Diagnosis not present

## 2021-07-28 DIAGNOSIS — E039 Hypothyroidism, unspecified: Secondary | ICD-10-CM | POA: Diagnosis not present

## 2021-07-28 DIAGNOSIS — M419 Scoliosis, unspecified: Secondary | ICD-10-CM | POA: Diagnosis not present

## 2021-07-28 DIAGNOSIS — F32A Depression, unspecified: Secondary | ICD-10-CM | POA: Diagnosis not present

## 2021-07-28 DIAGNOSIS — A310 Pulmonary mycobacterial infection: Secondary | ICD-10-CM | POA: Diagnosis not present

## 2021-07-28 DIAGNOSIS — I471 Supraventricular tachycardia: Secondary | ICD-10-CM | POA: Diagnosis not present

## 2021-07-28 DIAGNOSIS — I129 Hypertensive chronic kidney disease with stage 1 through stage 4 chronic kidney disease, or unspecified chronic kidney disease: Secondary | ICD-10-CM | POA: Diagnosis not present

## 2021-07-28 DIAGNOSIS — N183 Chronic kidney disease, stage 3 unspecified: Secondary | ICD-10-CM | POA: Diagnosis not present

## 2021-07-28 DIAGNOSIS — D62 Acute posthemorrhagic anemia: Secondary | ICD-10-CM | POA: Diagnosis not present

## 2021-07-28 DIAGNOSIS — Z86718 Personal history of other venous thrombosis and embolism: Secondary | ICD-10-CM | POA: Diagnosis not present

## 2021-07-28 DIAGNOSIS — Z9181 History of falling: Secondary | ICD-10-CM | POA: Diagnosis not present

## 2021-07-28 DIAGNOSIS — J449 Chronic obstructive pulmonary disease, unspecified: Secondary | ICD-10-CM | POA: Diagnosis not present

## 2021-07-28 DIAGNOSIS — E875 Hyperkalemia: Secondary | ICD-10-CM | POA: Diagnosis not present

## 2021-07-28 DIAGNOSIS — K5909 Other constipation: Secondary | ICD-10-CM | POA: Diagnosis not present

## 2021-07-28 DIAGNOSIS — E876 Hypokalemia: Secondary | ICD-10-CM | POA: Diagnosis not present

## 2021-07-28 DIAGNOSIS — K219 Gastro-esophageal reflux disease without esophagitis: Secondary | ICD-10-CM | POA: Diagnosis not present

## 2021-07-31 DIAGNOSIS — M419 Scoliosis, unspecified: Secondary | ICD-10-CM | POA: Diagnosis not present

## 2021-07-31 DIAGNOSIS — I129 Hypertensive chronic kidney disease with stage 1 through stage 4 chronic kidney disease, or unspecified chronic kidney disease: Secondary | ICD-10-CM | POA: Diagnosis not present

## 2021-07-31 DIAGNOSIS — J449 Chronic obstructive pulmonary disease, unspecified: Secondary | ICD-10-CM | POA: Diagnosis not present

## 2021-07-31 DIAGNOSIS — K219 Gastro-esophageal reflux disease without esophagitis: Secondary | ICD-10-CM | POA: Diagnosis not present

## 2021-07-31 DIAGNOSIS — N183 Chronic kidney disease, stage 3 unspecified: Secondary | ICD-10-CM | POA: Diagnosis not present

## 2021-07-31 DIAGNOSIS — Z86718 Personal history of other venous thrombosis and embolism: Secondary | ICD-10-CM | POA: Diagnosis not present

## 2021-07-31 DIAGNOSIS — E875 Hyperkalemia: Secondary | ICD-10-CM | POA: Diagnosis not present

## 2021-07-31 DIAGNOSIS — D62 Acute posthemorrhagic anemia: Secondary | ICD-10-CM | POA: Diagnosis not present

## 2021-07-31 DIAGNOSIS — I471 Supraventricular tachycardia: Secondary | ICD-10-CM | POA: Diagnosis not present

## 2021-07-31 DIAGNOSIS — E876 Hypokalemia: Secondary | ICD-10-CM | POA: Diagnosis not present

## 2021-07-31 DIAGNOSIS — K5909 Other constipation: Secondary | ICD-10-CM | POA: Diagnosis not present

## 2021-07-31 DIAGNOSIS — A310 Pulmonary mycobacterial infection: Secondary | ICD-10-CM | POA: Diagnosis not present

## 2021-07-31 DIAGNOSIS — S72142D Displaced intertrochanteric fracture of left femur, subsequent encounter for closed fracture with routine healing: Secondary | ICD-10-CM | POA: Diagnosis not present

## 2021-07-31 DIAGNOSIS — Z9181 History of falling: Secondary | ICD-10-CM | POA: Diagnosis not present

## 2021-07-31 DIAGNOSIS — E039 Hypothyroidism, unspecified: Secondary | ICD-10-CM | POA: Diagnosis not present

## 2021-07-31 DIAGNOSIS — F32A Depression, unspecified: Secondary | ICD-10-CM | POA: Diagnosis not present

## 2021-08-01 ENCOUNTER — Encounter: Payer: Self-pay | Admitting: Nurse Practitioner

## 2021-08-01 ENCOUNTER — Ambulatory Visit (INDEPENDENT_AMBULATORY_CARE_PROVIDER_SITE_OTHER): Payer: Medicare Other | Admitting: Nurse Practitioner

## 2021-08-01 ENCOUNTER — Other Ambulatory Visit: Payer: Self-pay

## 2021-08-01 VITALS — BP 136/78 | HR 73 | Temp 96.8°F | Ht 59.0 in | Wt 73.2 lb

## 2021-08-01 DIAGNOSIS — M7122 Synovial cyst of popliteal space [Baker], left knee: Secondary | ICD-10-CM | POA: Diagnosis not present

## 2021-08-01 DIAGNOSIS — M7989 Other specified soft tissue disorders: Secondary | ICD-10-CM | POA: Diagnosis not present

## 2021-08-01 DIAGNOSIS — B370 Candidal stomatitis: Secondary | ICD-10-CM

## 2021-08-01 DIAGNOSIS — R54 Age-related physical debility: Secondary | ICD-10-CM | POA: Diagnosis not present

## 2021-08-01 DIAGNOSIS — L568 Other specified acute skin changes due to ultraviolet radiation: Secondary | ICD-10-CM

## 2021-08-01 DIAGNOSIS — E43 Unspecified severe protein-calorie malnutrition: Secondary | ICD-10-CM

## 2021-08-01 DIAGNOSIS — M79662 Pain in left lower leg: Secondary | ICD-10-CM | POA: Diagnosis not present

## 2021-08-01 MED ORDER — FLUCONAZOLE 150 MG PO TABS: 150.0000 mg | ORAL_TABLET | Freq: Once | ORAL | 0 refills | Status: AC

## 2021-08-01 MED ORDER — MUPIROCIN 2 % EX OINT
1.0000 | TOPICAL_OINTMENT | Freq: Two times a day (BID) | CUTANEOUS | 0 refills | Status: AC
Start: 2021-08-01 — End: 2021-08-08

## 2021-08-01 NOTE — Progress Notes (Signed)
Subjective:  Patient ID: Stephanie Clay, female    DOB: Jan 31, 1932  Age: 87 y.o. MRN: [id number]  Chief Complaint  Patient presents with   Leg Pain     HPI   Stephanie Clay is an 87 year old Caucasian female that presents today for evaluation of left calf pain. She is accompanied by her daughter that helps supplement history. States she has pain, swelling, redness, and warmth to left calf. Onset was two days ago.Treatment has included Tylenol 325 mg and elevation. Weight-bearing aggravates pain. Nothing alleviates pain/swelling.Takes ASA 325 mg daily.She fell in June 2022,fracturing left hip, required surgical fixation with hardware.She ambulates with walker and is chair fast in a w/c throughout the day. Daughter states Stephanie Clay has not returned to her baseline health since left hip fracture.She appears frail, seated in w/c, weight 73 pounds, BMI 14.78.She has a past medical history of DVT several years ago.   Aftyn states she has redness and irritation to chin and corners of mouth that has been recurrent for months. States treated with Ketoconazole topical cream but symptoms return. States she has whitish coating on tongue that has been treated with Nystatin swish without resolution of symptoms.   Current Outpatient Medications on File Prior to Visit  Medication Sig Dispense Refill   acetaminophen (TYLENOL) 325 MG tablet Take 650 mg by mouth every 6 (six) hours as needed for mild pain.     albuterol (VENTOLIN HFA) 108 (90 Base) MCG/ACT inhaler Inhale 2 puffs into the lungs every 6 (six) hours as needed for wheezing or shortness of breath. 8 g 6   ALPRAZolam (XANAX) 0.5 MG tablet Take 1 tablet (0.5 mg total) by mouth at bedtime. 30 tablet 2   aspirin 325 MG tablet Take 325 mg by mouth 3 (three) times daily before meals.     Budeson-Glycopyrrol-Formoterol (BREZTRI AEROSPHERE) 160-9-4.8 MCG/ACT AERO Inhale 2 puffs into the lungs 2 (two) times daily. 10.7 g 2   Cholecalciferol (VITAMIN D3 PO)  Take 2,000 Units by mouth daily.     Cyanocobalamin 2500 MCG TABS Take 2,500 mcg by mouth daily.     dicyclomine (BENTYL) 10 MG capsule TAKE 1 CAPSULE BY MOUTH BEFORE EACH MEAL AND AT BEDTIME AS NEEDED FOR COLON SPASM 120 capsule 0   diltiazem (CARDIZEM) 30 MG tablet Take 1 tablet (30 mg total) by mouth every 8 (eight) hours. 270 tablet 0   fluticasone (FLONASE) 50 MCG/ACT nasal spray Place 2 sprays into both nostrils as needed for allergies or rhinitis.     gabapentin (NEURONTIN) 300 MG capsule TAKE 1 CAPSULE BY MOUTH 2 TIMES DAILY. 60 capsule 0   ketoconazole (NIZORAL) 2 % cream Twice daily to chin and corners of her mouth 15 g 0   levothyroxine (SYNTHROID) 75 MCG tablet Take 1 tablet (75 mcg total) by mouth daily. 90 tablet 3   nitroGLYCERIN (NITROSTAT) 0.4 MG SL tablet Place 0.4 mg under the tongue every 5 (five) minutes as needed for chest pain. X 3 doses     nystatin (MYCOSTATIN) 100000 UNIT/ML suspension Take 5 mLs (500,000 Units total) by mouth 3 (three) times daily. 60 mL 3   ondansetron (ZOFRAN-ODT) 4 MG disintegrating tablet Take 4 mg by mouth as needed for nausea/vomiting.     pantoprazole (PROTONIX) 40 MG tablet TAKE 1 TABLET TWICE DAILY 180 tablet 1   polyethylene glycol (MIRALAX / GLYCOLAX) 17 g packet Take 17 g by mouth 2 (two) times daily.     potassium chloride SA (KLOR-CON) 20 MEQ  tablet TAKE 3 TABLETS BY MOUTH ONCE DAILY. 90 tablet 2   predniSONE (DELTASONE) 10 MG tablet Take 1 tablet (10 mg total) by mouth daily with breakfast. 10 tablet 0   propranolol (INDERAL) 10 MG tablet Take 1 tablet (10 mg total) by mouth 2 (two) times daily. 60 tablet 1   sennosides-docusate sodium (SENOKOT-S) 8.6-50 MG tablet Take 1 tablet by mouth 2 (two) times daily.     simvastatin (ZOCOR) 10 MG tablet Take 1 tablet (10 mg total) by mouth daily. cholesterol 90 tablet 0   venlafaxine XR (EFFEXOR-XR) 150 MG 24 hr capsule TAKE 1 CAPSULE BY MOUTH IN THE MORNING 90 capsule 0   No current  facility-administered medications on file prior to visit.   Past Medical History:  Diagnosis Date   Abdominal pain 01/27/2018   AKI (acute kidney injury) (HCC) 06/30/2016   Allergic rhinitis 08/20/2016   Anemia    Aneurysm of other specified arteries (HCC)    Anxiety    Arthritis    Asthma    Bronchiectasis with evidence of chronic atypical infection 01/03/2015   CT chest 12/2014:  Bronchiectasis throughout right lung, left clear.  +tree and bud, + patchy gg infiltrates in RLL Sputum 2016: no growth, AFB smear neg PFT's 02/2015:  Normal except mild decrease in DLCO   Bronchiectasis without acute exacerbation (HCC) 01/03/2015   CT chest 12/2014:  Bronchiectasis throughout right lung, left clear.  +tree and bud, + patchy gg infiltrates in RLL Sputum 2016: no growth, AFB smear neg PFT's 02/2015:  Normal except mild decrease in DLCO    Cataract    bilateral eye surgery   Chronic bilateral low back pain without sciatica 11/29/2019   Chronic chest pain    Chronic idiopathic constipation    Chronic kidney disease, stage 3 (HCC)    Chronic venous hypertension (idiopathic) with other complications of bilateral lower extremity    Decreased pedal pulses 03/24/2020   Depression    Dysphagia 03/01/2015   Essential hypertension 07/01/2016   Gastro-esophageal reflux    Heart murmur    Hyperglycemia 07/01/2016   Hyperlipemia    Hyperlipidemia 07/01/2016   Hypertension    Hypokalemia 07/01/2016   Hypomagnesemia 07/01/2016   Hypothyroidism 07/01/2016   Idiopathic progressive neuropathy    Iron (Fe) deficiency anemia    Joint pain 03/24/2020   Localized edema 11/26/2019   Mild vitamin D deficiency 11/29/2019   Moderate protein-calorie malnutrition (HCC)    Normocytic anemia 01/07/2020   Other fatigue 11/26/2019   Other idiopathic scoliosis, lumbar region    Pneumathemia (HCC)    Primary generalized (osteo)arthritis    Primary insomnia    Radiculopathy, lumbar region    Scoliosis    Secondary hyperparathyroidism  (HCC)    Severe protein-calorie malnutrition (HCC) 03/24/2020   Underweight 07/02/2016   Unilateral primary osteoarthritis, left knee    Unilateral primary osteoarthritis, right knee    Past Surgical History:  Procedure Laterality Date   ABDOMINAL HYSTERECTOMY     CYSTOCELE REPAIR     SALPINGOOPHORECTOMY      Family History  Problem Relation Age of Onset   Emphysema Father    Heart disease Mother    Cancer Sister        metastatic lung cancer   Heart disease Sister    Hypertension Sister    Varicose Veins Sister    Hyperlipidemia Daughter    Hypertension Daughter    Diabetes Daughter    Hyperlipidemia Daughter    Hypertension  Daughter    Thyroid disease Daughter    Thyroid disease Daughter    Colon cancer Neg Hx    Esophageal cancer Neg Hx    Stomach cancer Neg Hx    Social History   Socioeconomic History   Marital status: Widowed    Spouse name: carl   Number of children: 3   Years of education: 9   Highest education level: Not on file  Occupational History   Occupation: retired  Tobacco Use   Smoking status: Never   Smokeless tobacco: Never  Vaping Use   Vaping Use: Never used  Substance and Sexual Activity   Alcohol use: No    Alcohol/week: 0.0 standard drinks   Drug use: No   Sexual activity: Not Currently  Other Topics Concern   Not on file  Social History Narrative   Right handed   Social Determinants of Health   Financial Resource Strain: Not on file  Food Insecurity: No Food Insecurity   Worried About Running Out of Food in the Last Year: Never true   Ran Out of Food in the Last Year: Never true  Transportation Needs: No Transportation Needs   Lack of Transportation (Medical): No   Lack of Transportation (Non-Medical): No  Physical Activity: Not on file  Stress: Not on file  Social Connections: Not on file    Review of Systems  Constitutional:  Positive for appetite change (decreased) and unexpected weight change (weight loss). Negative for  chills, fatigue and fever.  HENT:  Positive for rhinorrhea. Negative for congestion, ear pain and sore throat.   Eyes: Negative.   Respiratory:  Positive for wheezing. Negative for cough and shortness of breath.   Cardiovascular:  Positive for leg swelling (left calf, ankle). Negative for chest pain.  Gastrointestinal: Negative.   Endocrine: Negative.   Genitourinary: Negative.   Musculoskeletal:  Positive for arthralgias (left lower leg).  Skin:        Redness to left lower leg  Allergic/Immunologic: Negative.   Neurological:  Positive for weakness.  Hematological:  Bruises/bleeds easily.  Psychiatric/Behavioral: Negative.      Objective:  Pulse 73   Temp (!) 96.8 F (36 C)   Ht 4\' 11"  (1.499 m)   Wt 73 lb 3.2 oz (33.2 kg)   SpO2 93%   BMI 14.78 kg/m   BP/Weight 08/01/2021 07/13/2021 06/29/2021  Systolic BP - 132 130  Diastolic BP - 72 78  Wt. (Lbs) 73.2 71.6 72  BMI 14.78 14.46 14.54    Physical Exam Vitals reviewed.  Constitutional:      Appearance: She is ill-appearing.  HENT:     Head: Normocephalic.     Nose: Congestion present.     Mouth/Throat:     Mouth: Mucous membranes are dry.     Comments: Dark yellowish-white coating to tongue Cardiovascular:     Rate and Rhythm: Normal rate. Rhythm irregular.     Pulses: Normal pulses.     Heart sounds: Normal heart sounds.  Pulmonary:     Effort: Pulmonary effort is normal.     Breath sounds: Wheezing (inspiratory, faint) present.  Abdominal:     General: Bowel sounds are normal.     Palpations: Abdomen is soft.  Musculoskeletal:        General: Swelling present.     Left lower leg: Edema present.  Skin:    General: Skin is warm and dry.     Capillary Refill: Capillary refill takes less than 2 seconds.  Findings: Bruising and erythema (left lower leg) present.  Neurological:     General: No focal deficit present.     Mental Status: She is alert and oriented to person, place, and time.       Lab  Results  Component Value Date   WBC 11.7 (H) 06/29/2021   HGB 11.3 06/29/2021   HCT 34.7 06/29/2021   PLT 302 06/29/2021   GLUCOSE 76 06/29/2021   CHOL 185 05/16/2021   TRIG 83 05/16/2021   HDL 71 05/16/2021   LDLCALC 99 05/16/2021   ALT 7 06/29/2021   AST 17 06/29/2021   NA 141 06/29/2021   K 4.4 06/29/2021   CL 98 06/29/2021   CREATININE 0.82 06/29/2021   BUN 26 06/29/2021   CO2 28 06/29/2021   TSH 6.080 (H) 06/29/2021   HGBA1C 5.5 03/08/2020      Assessment & Plan:   .1. Pain of left calf - VAS [place] LOWER EXTREMITY VENOUS (DVT) -continue Tylenol as directed  2. Thrush, oral - fluconazole (DIFLUCAN) 150 MG tablet; Take 1 tablet (150 mg total) by mouth once for 1 dose.  Dispense: 1 tablet; Refill: 0 -increase fluid intake  3. Actinic cheilitis - mupirocin ointment (BACTROBAN) 2 %; Apply 1 application topically 2 (two) times daily for 7 days.  Dispense: 22 g; Refill: 0  4. Severe protein-calorie malnutrition (HCC) -increase oral intake -continue Boost and Ensure shakes  5. Age-related physical debility -fall precautions -assist with ADLs    Obtain left lower leg [place] at [facility] today at 3:30 Apply Mupirocin ointment to chin/mouth area twice daily for 7 days Take Diflucan 150 mg once by mouth for oral thrush Push fluids, especially water Follow-up as needed   Follow-up: PRN, pending left LE [place] today  An After Visit Summary was printed and given to the patient.  I, Janie Morning, NP, have reviewed all documentation for this visit. The documentation on 08/01/21 for the exam, diagnosis, procedures, and orders are all accurate and complete.   Janie Morning, NP [facility] [phone]

## 2021-08-01 NOTE — Patient Instructions (Addendum)
Obtain left lower leg [place] at [facility] today at 3:30 Apply Mupirocin ointment to chin/mouth area twice daily for 7 days Take Diflucan 150 mg once by mouth for oral thrush Push fluids, especially water Follow-up as needed  Peripheral Edema Peripheral edema is swelling that is caused by a buildup of fluid. Peripheral edema most often affects the lower legs, ankles, and feet. It can also develop in the arms, hands, and face. The area of the body that has peripheral edema will look swollen. It may also feel heavy or warm. Your clothes may start to feel tight. Pressing on the area may make a temporary dent in your skin. You may not be able to move your swollen arm or leg as much as usual. There are many causes of peripheral edema. It can happen because of a complication of other conditions such as congestive heart failure, kidney disease, or a problem with your blood circulation. It also can be a side effect of certain medicines or because of an infection. It often happens to women during pregnancy. Sometimes, the cause is not known. Follow these instructions at home: Managing pain, stiffness, and swelling  Raise (elevate) your legs while you are sitting or lying down. Move around often to prevent stiffness and to lessen swelling. Do not sit or stand for long periods of time. Wear support stockings as told by your health care provider. Medicines Take over-the-counter and prescription medicines only as told by your health care provider. Your health care provider may prescribe medicine to help your body get rid of excess water (diuretic). General instructions Pay attention to any changes in your symptoms. Follow instructions from your health care provider about limiting salt (sodium) in your diet. Sometimes, eating less salt may reduce swelling. Moisturize skin daily to help prevent skin from cracking and draining. Keep all follow-up visits as told by your health care provider. This is  important. Contact a health care provider if you have: A fever. Edema that starts suddenly or is getting worse, especially if you are pregnant or have a medical condition. Swelling in only one leg. Increased swelling, redness, or pain in one or both of your legs. Drainage or sores at the area where you have edema. Get help right away if you: Develop shortness of breath, especially when you are lying down. Have pain in your chest or abdomen. Feel weak. Feel faint. Summary Peripheral edema is swelling that is caused by a buildup of fluid. Peripheral edema most often affects the lower legs, ankles, and feet. Move around often to prevent stiffness and to lessen swelling. Do not sit or stand for long periods of time. Pay attention to any changes in your symptoms. Contact a health care provider if you have edema that starts suddenly or is getting worse, especially if you are pregnant or have a medical condition. Get help right away if you develop shortness of breath, especially when lying down. This information is not intended to replace advice given to you by your health care provider. Make sure you discuss any questions you have with your health care provider. Document Revised: 07/02/2018 Document Reviewed: 07/02/2018 Elsevier Patient Education  2022 [employer] Oral Thrush, Adult Oral thrush is an infection in your mouth and throat and on your tongue. It causes white patches to form in your mouth and on your tongue. Many cases of thrush are mild. But, sometimes, thrush can be serious. People who have a weak body defense system (immune system) or other diseases can  be affected more. What are the causes? This condition is caused by a type of fungus called yeast. The fungus is normally present in small amounts in the mouth and nose. If a person has a long-term illness or a weak body defense system, the fungus can grow and spread quickly. This causes thrush. What increases the risk? You are  more likely to develop this condition if: You have a weak body defense system. You are an older adult. You have diabetes, cancer, or HIV. You have a dry mouth. You are pregnant or breastfeeding. You do not take good care of your teeth. This risk is greater for people who have false teeth (dentures). You use antibiotic or steroid medicines. What are the signs or symptoms? Symptoms of this condition include: A burning feeling in the mouth and throat. White patches that stick to the mouth and tongue. A bad taste in the mouth or trouble tasting foods. A feeling like you have cotton in your mouth. Pain when you eat and swallow. Not wanting to eat as much as usual. Cracking at the corners of the mouth. How is this treated? This condition is treated with medicines called antifungals. These medicines prevent a fungus from growing. The medicines are either put right on the area (topical) or swallowed (oral). Your doctor will also treat other problems that you may have, such as diabetes or HIV. Follow these instructions at home: Medicines Take or use over-the-counter and prescription medicines only as told by your doctor. Ask your doctor about an over-the-counter medicine called gentian violet. Helping with pain and soreness To lessen your pain: Drink cold liquids, like water and iced tea. Eat frozen ice pops or frozen juices. Eat foods that are easy to swallow, like gelatin and ice cream. Drink from a straw if you have too much pain in your mouth.  General instructions Eat plain yogurt that has live cultures in it. Read the label to make sure that there are live cultures in your yogurt. If you wear false teeth: Take them out before you go to bed. Brush them well. Soak them in a cleaner. Rinse your mouth with warm salt-water many times a day. To make the salt-water mixture, dissolve -1 teaspoon (3-6 g) of salt in 1 cup (237 mL) of warm water. Contact a doctor if: Your problems do not  get better within 7 days of treatment. Your infection is spreading. This may show as white areas on the skin outside of your mouth. You are nursing your baby and you have redness and pain in the nipples. Summary Oral thrush is an infection in your mouth and throat. It is caused by a fungus. You are more likely to get this condition if you have a weak body defense system. Diseases like diabetes, cancer, or HIV also add to your risk. This condition is treated with medicines called antifungals. Contact a doctor if you do not get better within 7 days of starting treatment. This information is not intended to replace advice given to you by your health care provider. Make sure you discuss any questions you have with your health care provider. Document Revised: 08/14/2019 Document Reviewed: 08/14/2019 Elsevier Patient Education  2022 [employer].

## 2021-08-02 DIAGNOSIS — E876 Hypokalemia: Secondary | ICD-10-CM | POA: Diagnosis not present

## 2021-08-02 DIAGNOSIS — A310 Pulmonary mycobacterial infection: Secondary | ICD-10-CM | POA: Diagnosis not present

## 2021-08-02 DIAGNOSIS — Z86718 Personal history of other venous thrombosis and embolism: Secondary | ICD-10-CM | POA: Diagnosis not present

## 2021-08-02 DIAGNOSIS — Z9181 History of falling: Secondary | ICD-10-CM | POA: Diagnosis not present

## 2021-08-02 DIAGNOSIS — I129 Hypertensive chronic kidney disease with stage 1 through stage 4 chronic kidney disease, or unspecified chronic kidney disease: Secondary | ICD-10-CM | POA: Diagnosis not present

## 2021-08-02 DIAGNOSIS — E875 Hyperkalemia: Secondary | ICD-10-CM | POA: Diagnosis not present

## 2021-08-02 DIAGNOSIS — S72142D Displaced intertrochanteric fracture of left femur, subsequent encounter for closed fracture with routine healing: Secondary | ICD-10-CM | POA: Diagnosis not present

## 2021-08-02 DIAGNOSIS — I471 Supraventricular tachycardia: Secondary | ICD-10-CM | POA: Diagnosis not present

## 2021-08-02 DIAGNOSIS — N183 Chronic kidney disease, stage 3 unspecified: Secondary | ICD-10-CM | POA: Diagnosis not present

## 2021-08-02 DIAGNOSIS — M419 Scoliosis, unspecified: Secondary | ICD-10-CM | POA: Diagnosis not present

## 2021-08-02 DIAGNOSIS — D62 Acute posthemorrhagic anemia: Secondary | ICD-10-CM | POA: Diagnosis not present

## 2021-08-02 DIAGNOSIS — E039 Hypothyroidism, unspecified: Secondary | ICD-10-CM | POA: Diagnosis not present

## 2021-08-02 DIAGNOSIS — K5909 Other constipation: Secondary | ICD-10-CM | POA: Diagnosis not present

## 2021-08-02 DIAGNOSIS — F32A Depression, unspecified: Secondary | ICD-10-CM | POA: Diagnosis not present

## 2021-08-02 DIAGNOSIS — J449 Chronic obstructive pulmonary disease, unspecified: Secondary | ICD-10-CM | POA: Diagnosis not present

## 2021-08-02 DIAGNOSIS — K219 Gastro-esophageal reflux disease without esophagitis: Secondary | ICD-10-CM | POA: Diagnosis not present

## 2021-08-09 ENCOUNTER — Telehealth: Payer: Self-pay

## 2021-08-09 DIAGNOSIS — M419 Scoliosis, unspecified: Secondary | ICD-10-CM | POA: Diagnosis not present

## 2021-08-09 DIAGNOSIS — E875 Hyperkalemia: Secondary | ICD-10-CM | POA: Diagnosis not present

## 2021-08-09 DIAGNOSIS — E876 Hypokalemia: Secondary | ICD-10-CM | POA: Diagnosis not present

## 2021-08-09 DIAGNOSIS — I129 Hypertensive chronic kidney disease with stage 1 through stage 4 chronic kidney disease, or unspecified chronic kidney disease: Secondary | ICD-10-CM | POA: Diagnosis not present

## 2021-08-09 DIAGNOSIS — A310 Pulmonary mycobacterial infection: Secondary | ICD-10-CM | POA: Diagnosis not present

## 2021-08-09 DIAGNOSIS — J449 Chronic obstructive pulmonary disease, unspecified: Secondary | ICD-10-CM | POA: Diagnosis not present

## 2021-08-09 DIAGNOSIS — N183 Chronic kidney disease, stage 3 unspecified: Secondary | ICD-10-CM | POA: Diagnosis not present

## 2021-08-09 DIAGNOSIS — I471 Supraventricular tachycardia: Secondary | ICD-10-CM | POA: Diagnosis not present

## 2021-08-09 DIAGNOSIS — K219 Gastro-esophageal reflux disease without esophagitis: Secondary | ICD-10-CM | POA: Diagnosis not present

## 2021-08-09 DIAGNOSIS — Z9181 History of falling: Secondary | ICD-10-CM | POA: Diagnosis not present

## 2021-08-09 DIAGNOSIS — Z86718 Personal history of other venous thrombosis and embolism: Secondary | ICD-10-CM | POA: Diagnosis not present

## 2021-08-09 DIAGNOSIS — D62 Acute posthemorrhagic anemia: Secondary | ICD-10-CM | POA: Diagnosis not present

## 2021-08-09 DIAGNOSIS — S72142D Displaced intertrochanteric fracture of left femur, subsequent encounter for closed fracture with routine healing: Secondary | ICD-10-CM | POA: Diagnosis not present

## 2021-08-09 DIAGNOSIS — E039 Hypothyroidism, unspecified: Secondary | ICD-10-CM | POA: Diagnosis not present

## 2021-08-09 DIAGNOSIS — K5909 Other constipation: Secondary | ICD-10-CM | POA: Diagnosis not present

## 2021-08-09 DIAGNOSIS — F32A Depression, unspecified: Secondary | ICD-10-CM | POA: Diagnosis not present

## 2021-08-09 NOTE — Telephone Encounter (Signed)
Received three cases of Ensure from [facility] department - spoke with patient's daughter and she will send someone by to pick up for Stephanie Clay in [address].

## 2021-08-14 ENCOUNTER — Other Ambulatory Visit: Payer: Self-pay | Admitting: Family Medicine

## 2021-08-14 ENCOUNTER — Ambulatory Visit (INDEPENDENT_AMBULATORY_CARE_PROVIDER_SITE_OTHER): Payer: Medicare Other | Admitting: Family Medicine

## 2021-08-14 ENCOUNTER — Other Ambulatory Visit: Payer: Self-pay

## 2021-08-14 VITALS — BP 130/64 | HR 76 | Temp 97.7°F | Resp 18 | Ht 59.0 in | Wt 70.6 lb

## 2021-08-14 DIAGNOSIS — F331 Major depressive disorder, recurrent, moderate: Secondary | ICD-10-CM | POA: Diagnosis not present

## 2021-08-14 DIAGNOSIS — J4541 Moderate persistent asthma with (acute) exacerbation: Secondary | ICD-10-CM

## 2021-08-14 DIAGNOSIS — J4540 Moderate persistent asthma, uncomplicated: Secondary | ICD-10-CM

## 2021-08-14 DIAGNOSIS — M545 Low back pain, unspecified: Secondary | ICD-10-CM | POA: Diagnosis not present

## 2021-08-14 DIAGNOSIS — G8929 Other chronic pain: Secondary | ICD-10-CM | POA: Diagnosis not present

## 2021-08-14 DIAGNOSIS — G250 Essential tremor: Secondary | ICD-10-CM | POA: Diagnosis not present

## 2021-08-14 DIAGNOSIS — E43 Unspecified severe protein-calorie malnutrition: Secondary | ICD-10-CM

## 2021-08-14 MED ORDER — PROPRANOLOL HCL 20 MG PO TABS: 20.0000 mg | ORAL_TABLET | Freq: Two times a day (BID) | ORAL | 1 refills | Status: DC

## 2021-08-14 MED ORDER — DICLOFENAC EPOLAMINE 1.3 % EX PTCH: 1.0000 | MEDICATED_PATCH | Freq: Two times a day (BID) | CUTANEOUS | 2 refills | Status: DC

## 2021-08-14 NOTE — Patient Instructions (Signed)
Tremor: Increase propranolol to 20 mg one twice a day.   Back pain: Flector patch: one twice a day to area of pain.  May still use tylenol.   Depression: Start trintellix 5 mg once daily x 2 weeks, then increase to trintellix 10 mg once daily.  When you start the trintellix 10 mg once daily, change  effexor xr (venlafaxine) to every other day for one week then stop.

## 2021-08-14 NOTE — Progress Notes (Signed)
Subjective:  Patient ID: Stephanie Clay, female    DOB: 07/03/1932  Age: 87 y.o. MRN: [id number]  Chief Complaint  Patient presents with   Benign Essential Tremor    1 month follow up    HPI Tremor: stopped metoprolol xl, and start propranolol 10 mg once twice a day. May be helping.   Complaining of back pain. Hurts all day. Tylenol does not help. Unable to take nsaids orally. Upsets her stomach.   Depression, moderate, recurrent. ON effexor xr. Not helping.   PHQ9 SCORE ONLY 08/14/2021 07/13/2021 05/16/2021  PHQ-9 Total Score 19 1 0    Current Outpatient Medications on File Prior to Visit  Medication Sig Dispense Refill   acetaminophen (TYLENOL) 325 MG tablet Take 650 mg by mouth every 6 (six) hours as needed for mild pain.     albuterol (VENTOLIN HFA) 108 (90 Base) MCG/ACT inhaler Inhale 2 puffs into the lungs every 6 (six) hours as needed for wheezing or shortness of breath. 8 g 6   ALPRAZolam (XANAX) 0.5 MG tablet Take 1 tablet (0.5 mg total) by mouth at bedtime. 30 tablet 2   aspirin 325 MG tablet Take 325 mg by mouth 3 (three) times daily before meals.     Budeson-Glycopyrrol-Formoterol (BREZTRI AEROSPHERE) 160-9-4.8 MCG/ACT AERO Inhale 2 puffs into the lungs 2 (two) times daily. 10.7 g 2   Cholecalciferol (VITAMIN D3 PO) Take 2,000 Units by mouth daily.     Cyanocobalamin 2500 MCG TABS Take 2,500 mcg by mouth daily.     dicyclomine (BENTYL) 10 MG capsule TAKE 1 CAPSULE BY MOUTH BEFORE EACH MEAL AND AT BEDTIME AS NEEDED FOR COLON SPASM 120 capsule 0   diltiazem (CARDIZEM) 30 MG tablet Take 1 tablet (30 mg total) by mouth every 8 (eight) hours. 270 tablet 0   fluticasone (FLONASE) 50 MCG/ACT nasal spray Place 2 sprays into both nostrils as needed for allergies or rhinitis.     gabapentin (NEURONTIN) 300 MG capsule TAKE 1 CAPSULE BY MOUTH 2 TIMES DAILY. 60 capsule 0   ketoconazole (NIZORAL) 2 % cream Twice daily to chin and corners of her mouth 15 g 0    levothyroxine (SYNTHROID) 75 MCG tablet Take 1 tablet (75 mcg total) by mouth daily. 90 tablet 3   nitroGLYCERIN (NITROSTAT) 0.4 MG SL tablet Place 0.4 mg under the tongue every 5 (five) minutes as needed for chest pain. X 3 doses     nystatin (MYCOSTATIN) 100000 UNIT/ML suspension Take 5 mLs (500,000 Units total) by mouth 3 (three) times daily. 60 mL 3   ondansetron (ZOFRAN-ODT) 4 MG disintegrating tablet Take 4 mg by mouth as needed for nausea/vomiting.     polyethylene glycol (MIRALAX / GLYCOLAX) 17 g packet Take 17 g by mouth 2 (two) times daily.     potassium chloride SA (KLOR-CON) 20 MEQ tablet TAKE 3 TABLETS BY MOUTH ONCE DAILY. 90 tablet 2   sennosides-docusate sodium (SENOKOT-S) 8.6-50 MG tablet Take 1 tablet by mouth 2 (two) times daily.     simvastatin (ZOCOR) 10 MG tablet Take 1 tablet (10 mg total) by mouth daily. cholesterol 90 tablet 0   No current facility-administered medications on file prior to visit.   Past Medical History:  Diagnosis Date   Abdominal pain 01/27/2018   AKI (acute kidney injury) (HCC) 06/30/2016   Allergic rhinitis 08/20/2016   Anemia    Aneurysm of other specified arteries (HCC)    Anxiety    Arthritis    Asthma  Bronchiectasis with evidence of chronic atypical infection 01/03/2015   CT chest 12/2014:  Bronchiectasis throughout right lung, left clear.  +tree and bud, + patchy gg infiltrates in RLL Sputum 2016: no growth, AFB smear neg PFT's 02/2015:  Normal except mild decrease in DLCO   Bronchiectasis without acute exacerbation (HCC) 01/03/2015   CT chest 12/2014:  Bronchiectasis throughout right lung, left clear.  +tree and bud, + patchy gg infiltrates in RLL Sputum 2016: no growth, AFB smear neg PFT's 02/2015:  Normal except mild decrease in DLCO    Cataract    bilateral eye surgery   Chronic bilateral low back pain without sciatica 11/29/2019   Chronic chest pain    Chronic idiopathic constipation    Chronic kidney disease, stage 3 (HCC)    Chronic  venous hypertension (idiopathic) with other complications of bilateral lower extremity    Decreased pedal pulses 03/24/2020   Depression    Dysphagia 03/01/2015   Essential hypertension 07/01/2016   Gastro-esophageal reflux    Heart murmur    Hyperglycemia 07/01/2016   Hyperlipemia    Hyperlipidemia 07/01/2016   Hypertension    Hypokalemia 07/01/2016   Hypomagnesemia 07/01/2016   Hypothyroidism 07/01/2016   Idiopathic progressive neuropathy    Iron (Fe) deficiency anemia    Joint pain 03/24/2020   Localized edema 11/26/2019   Mild vitamin D deficiency 11/29/2019   Moderate protein-calorie malnutrition (HCC)    Normocytic anemia 01/07/2020   Other fatigue 11/26/2019   Other idiopathic scoliosis, lumbar region    Pneumathemia (HCC)    Primary generalized (osteo)arthritis    Primary insomnia    Radiculopathy, lumbar region    Scoliosis    Secondary hyperparathyroidism (HCC)    Severe protein-calorie malnutrition (HCC) 03/24/2020   Underweight 07/02/2016   Unilateral primary osteoarthritis, left knee    Unilateral primary osteoarthritis, right knee    Past Surgical History:  Procedure Laterality Date   ABDOMINAL HYSTERECTOMY     CYSTOCELE REPAIR     SALPINGOOPHORECTOMY      Family History  Problem Relation Age of Onset   Emphysema Father    Heart disease Mother    Cancer Sister        metastatic lung cancer   Heart disease Sister    Hypertension Sister    Varicose Veins Sister    Hyperlipidemia Daughter    Hypertension Daughter    Diabetes Daughter    Hyperlipidemia Daughter    Hypertension Daughter    Thyroid disease Daughter    Thyroid disease Daughter    Colon cancer Neg Hx    Esophageal cancer Neg Hx    Stomach cancer Neg Hx    Social History   Socioeconomic History   Marital status: Widowed    Spouse name: carl   Number of children: 3   Years of education: 9   Highest education level: Not on file  Occupational History   Occupation: retired  Tobacco Use   Smoking  status: Never   Smokeless tobacco: Never  Vaping Use   Vaping Use: Never used  Substance and Sexual Activity   Alcohol use: No    Alcohol/week: 0.0 standard drinks   Drug use: No   Sexual activity: Not Currently  Other Topics Concern   Not on file  Social History Narrative   Right handed   Social Determinants of Health   Financial Resource Strain: Not on file  Food Insecurity: No Food Insecurity   Worried About Running Out of Food in the  Last Year: Never true   Ran Out of Food in the Last Year: Never true  Transportation Needs: No Transportation Needs   Lack of Transportation (Medical): No   Lack of Transportation (Non-Medical): No  Physical Activity: Not on file  Stress: Not on file  Social Connections: Not on file    Review of Systems  Constitutional:  Positive for fatigue. Negative for appetite change and fever.  HENT:  Negative for congestion, ear pain, sinus pressure and sore throat.   Eyes:  Negative for pain.  Respiratory:  Positive for cough. Negative for chest tightness, shortness of breath and wheezing.   Cardiovascular:  Negative for chest pain and palpitations.  Gastrointestinal:  Positive for abdominal pain. Negative for constipation, diarrhea, nausea and vomiting.  Genitourinary:  Negative for dysuria and hematuria.  Musculoskeletal:  Positive for back pain. Negative for arthralgias, joint swelling and myalgias.  Skin:  Negative for rash.  Neurological:  Positive for dizziness, tremors and headaches. Negative for weakness.  Psychiatric/Behavioral:  Negative for dysphoric mood. The patient is not nervous/anxious.     Objective:  BP 130/64   Pulse 76   Temp 97.7 F (36.5 C)   Resp 18   Ht 4\' 11"  (1.499 m)   Wt 70 lb 9.6 oz (32 kg)   BMI 14.26 kg/m   BP/Weight 08/14/2021 08/01/2021 07/13/2021  Systolic BP 130 136 132  Diastolic BP 64 78 72  Wt. (Lbs) 70.6 73.2 71.6  BMI 14.26 14.78 14.46    Physical Exam Vitals reviewed.  Constitutional:       Appearance: Normal appearance. She is normal weight.  Cardiovascular:     Rate and Rhythm: Normal rate and regular rhythm.     Heart sounds: Normal heart sounds.  Pulmonary:     Effort: Pulmonary effort is normal. No respiratory distress.     Breath sounds: Wheezing present.  Abdominal:     General: Abdomen is flat. Bowel sounds are normal.     Palpations: Abdomen is soft.     Tenderness: There is no abdominal tenderness.  Musculoskeletal:        General: Tenderness (lumbar. negative SLR.) present.  Neurological:     Mental Status: She is alert and oriented to person, place, and time.  Psychiatric:        Mood and Affect: Mood normal.        Behavior: Behavior normal.    Diabetic Foot Exam - Simple   No data filed      Lab Results  Component Value Date   WBC 11.7 (H) 06/29/2021   HGB 11.3 06/29/2021   HCT 34.7 06/29/2021   PLT 302 06/29/2021   GLUCOSE 76 06/29/2021   CHOL 185 05/16/2021   TRIG 83 05/16/2021   HDL 71 05/16/2021   LDLCALC 99 05/16/2021   ALT 7 06/29/2021   AST 17 06/29/2021   NA 141 06/29/2021   K 4.4 06/29/2021   CL 98 06/29/2021   CREATININE 0.82 06/29/2021   BUN 26 06/29/2021   CO2 28 06/29/2021   TSH 6.080 (H) 06/29/2021   HGBA1C 5.5 03/08/2020      Assessment & Plan:   Problem List Items Addressed This Visit       Respiratory   Moderate persistent asthma without complication    Continue breztri.        Nervous and Auditory   Benign essential tremor - Primary    Increase propranolol to 20 mg one twice a day.  Other   Chronic bilateral low back pain without sciatica    Flector patch: one twice a day to area of pain.  May still use tylenol.        Moderate recurrent major depression (HCC)    Start trintellix 5 mg once daily x 2 weeks, then increase to trintellix 10 mg once daily.  When you start the trintellix 10 mg once daily, change  effexor xr (venlafaxine) to every other day for one week then stop.       .  Meds ordered this encounter  Medications   diclofenac (FLECTOR) 1.3 % PTCH    Sig: Place 1 patch onto the skin 2 (two) times daily.    Dispense:  60 patch    Refill:  2   propranolol (INDERAL) 20 MG tablet    Sig: Take 1 tablet (20 mg total) by mouth 2 (two) times daily.    Dispense:  60 tablet    Refill:  1    Follow-up: Return in about 4 weeks (around 09/11/2021) for chronic follow up.  An After Visit Summary was printed and given to the patient.   I,Lauren M Auman,acting as a scribe for Blane Ohara, MD.,have documented all relevant documentation on the behalf of Blane Ohara, MD,as directed by  Blane Ohara, MD while in the presence of Blane Ohara, MD.    Blane Ohara, MD [facility] [phone]

## 2021-08-15 ENCOUNTER — Telehealth: Payer: Self-pay

## 2021-08-15 DIAGNOSIS — Z86718 Personal history of other venous thrombosis and embolism: Secondary | ICD-10-CM | POA: Diagnosis not present

## 2021-08-15 DIAGNOSIS — I129 Hypertensive chronic kidney disease with stage 1 through stage 4 chronic kidney disease, or unspecified chronic kidney disease: Secondary | ICD-10-CM | POA: Diagnosis not present

## 2021-08-15 DIAGNOSIS — K219 Gastro-esophageal reflux disease without esophagitis: Secondary | ICD-10-CM | POA: Diagnosis not present

## 2021-08-15 DIAGNOSIS — K5909 Other constipation: Secondary | ICD-10-CM | POA: Diagnosis not present

## 2021-08-15 DIAGNOSIS — E875 Hyperkalemia: Secondary | ICD-10-CM | POA: Diagnosis not present

## 2021-08-15 DIAGNOSIS — M419 Scoliosis, unspecified: Secondary | ICD-10-CM | POA: Diagnosis not present

## 2021-08-15 DIAGNOSIS — J449 Chronic obstructive pulmonary disease, unspecified: Secondary | ICD-10-CM | POA: Diagnosis not present

## 2021-08-15 DIAGNOSIS — I471 Supraventricular tachycardia: Secondary | ICD-10-CM | POA: Diagnosis not present

## 2021-08-15 DIAGNOSIS — N183 Chronic kidney disease, stage 3 unspecified: Secondary | ICD-10-CM | POA: Diagnosis not present

## 2021-08-15 DIAGNOSIS — E876 Hypokalemia: Secondary | ICD-10-CM | POA: Diagnosis not present

## 2021-08-15 DIAGNOSIS — S72142D Displaced intertrochanteric fracture of left femur, subsequent encounter for closed fracture with routine healing: Secondary | ICD-10-CM | POA: Diagnosis not present

## 2021-08-15 DIAGNOSIS — E039 Hypothyroidism, unspecified: Secondary | ICD-10-CM | POA: Diagnosis not present

## 2021-08-15 DIAGNOSIS — A310 Pulmonary mycobacterial infection: Secondary | ICD-10-CM | POA: Diagnosis not present

## 2021-08-15 DIAGNOSIS — D62 Acute posthemorrhagic anemia: Secondary | ICD-10-CM | POA: Diagnosis not present

## 2021-08-15 DIAGNOSIS — F32A Depression, unspecified: Secondary | ICD-10-CM | POA: Diagnosis not present

## 2021-08-15 DIAGNOSIS — Z9181 History of falling: Secondary | ICD-10-CM | POA: Diagnosis not present

## 2021-08-15 NOTE — Telephone Encounter (Signed)
Stephanie Clay called from [facility] to let us know after the patient left here yesterday, she got home and had a fall. Her daughter was able to help brace her fall but she still have bruises on both elbows and right hand. He states patient said she is sore today but doing okay. They just wanted to left [place] know.

## 2021-08-15 NOTE — Telephone Encounter (Signed)
Prior Auth was sent through patient's insurance and patient was approved for the flector patch 08/15/2021 to 10/21/2022.

## 2021-08-19 ENCOUNTER — Encounter: Payer: Self-pay | Admitting: Family Medicine

## 2021-08-19 DIAGNOSIS — F331 Major depressive disorder, recurrent, moderate: Secondary | ICD-10-CM

## 2021-08-19 DIAGNOSIS — J4540 Moderate persistent asthma, uncomplicated: Secondary | ICD-10-CM

## 2021-08-19 HISTORY — DX: Moderate persistent asthma, uncomplicated: J45.40

## 2021-08-19 HISTORY — DX: Major depressive disorder, recurrent, moderate: F33.1

## 2021-08-19 NOTE — Assessment & Plan Note (Signed)
Continue breztri.  

## 2021-08-19 NOTE — Assessment & Plan Note (Signed)
Increase propranolol to 20 mg one twice a day.

## 2021-08-19 NOTE — Assessment & Plan Note (Signed)
Flector patch: one twice a day to area of pain.  May still use tylenol.

## 2021-08-19 NOTE — Assessment & Plan Note (Signed)
Start trintellix 5 mg once daily x 2 weeks, then increase to trintellix 10 mg once daily.  When you start the trintellix 10 mg once daily, change  effexor xr (venlafaxine) to every other day for one week then stop.

## 2021-08-21 ENCOUNTER — Other Ambulatory Visit: Payer: Self-pay | Admitting: Family Medicine

## 2021-08-21 ENCOUNTER — Other Ambulatory Visit: Payer: Self-pay

## 2021-08-21 DIAGNOSIS — J449 Chronic obstructive pulmonary disease, unspecified: Secondary | ICD-10-CM | POA: Diagnosis not present

## 2021-08-21 DIAGNOSIS — I129 Hypertensive chronic kidney disease with stage 1 through stage 4 chronic kidney disease, or unspecified chronic kidney disease: Secondary | ICD-10-CM | POA: Diagnosis not present

## 2021-08-21 DIAGNOSIS — K5909 Other constipation: Secondary | ICD-10-CM | POA: Diagnosis not present

## 2021-08-21 DIAGNOSIS — N183 Chronic kidney disease, stage 3 unspecified: Secondary | ICD-10-CM | POA: Diagnosis not present

## 2021-08-21 DIAGNOSIS — F32A Depression, unspecified: Secondary | ICD-10-CM | POA: Diagnosis not present

## 2021-08-21 DIAGNOSIS — S72142D Displaced intertrochanteric fracture of left femur, subsequent encounter for closed fracture with routine healing: Secondary | ICD-10-CM | POA: Diagnosis not present

## 2021-08-21 DIAGNOSIS — E875 Hyperkalemia: Secondary | ICD-10-CM | POA: Diagnosis not present

## 2021-08-21 DIAGNOSIS — Z86718 Personal history of other venous thrombosis and embolism: Secondary | ICD-10-CM | POA: Diagnosis not present

## 2021-08-21 DIAGNOSIS — E876 Hypokalemia: Secondary | ICD-10-CM | POA: Diagnosis not present

## 2021-08-21 DIAGNOSIS — M419 Scoliosis, unspecified: Secondary | ICD-10-CM | POA: Diagnosis not present

## 2021-08-21 DIAGNOSIS — D62 Acute posthemorrhagic anemia: Secondary | ICD-10-CM | POA: Diagnosis not present

## 2021-08-21 DIAGNOSIS — A310 Pulmonary mycobacterial infection: Secondary | ICD-10-CM | POA: Diagnosis not present

## 2021-08-21 DIAGNOSIS — E039 Hypothyroidism, unspecified: Secondary | ICD-10-CM | POA: Diagnosis not present

## 2021-08-21 DIAGNOSIS — Z9181 History of falling: Secondary | ICD-10-CM | POA: Diagnosis not present

## 2021-08-21 DIAGNOSIS — K219 Gastro-esophageal reflux disease without esophagitis: Secondary | ICD-10-CM | POA: Diagnosis not present

## 2021-08-21 DIAGNOSIS — I471 Supraventricular tachycardia: Secondary | ICD-10-CM | POA: Diagnosis not present

## 2021-08-21 NOTE — Telephone Encounter (Signed)
Physical Therapy called with concerns about their visit with Mrs. Hum today.  She is having wheezing on inhalations.  She was instructed by PT to use her inhaler and her nebulizer as ordered.  Dr. Sedalia Muta approved a nursing evaluation.  Graclynn's family were notified that a nurse will be coming out to evaluate and tx.

## 2021-08-22 ENCOUNTER — Telehealth: Payer: Self-pay

## 2021-08-22 DIAGNOSIS — E875 Hyperkalemia: Secondary | ICD-10-CM | POA: Diagnosis not present

## 2021-08-22 DIAGNOSIS — I471 Supraventricular tachycardia: Secondary | ICD-10-CM | POA: Diagnosis not present

## 2021-08-22 DIAGNOSIS — M419 Scoliosis, unspecified: Secondary | ICD-10-CM | POA: Diagnosis not present

## 2021-08-22 DIAGNOSIS — S72142D Displaced intertrochanteric fracture of left femur, subsequent encounter for closed fracture with routine healing: Secondary | ICD-10-CM | POA: Diagnosis not present

## 2021-08-22 DIAGNOSIS — E876 Hypokalemia: Secondary | ICD-10-CM | POA: Diagnosis not present

## 2021-08-22 DIAGNOSIS — F32A Depression, unspecified: Secondary | ICD-10-CM | POA: Diagnosis not present

## 2021-08-22 DIAGNOSIS — Z86718 Personal history of other venous thrombosis and embolism: Secondary | ICD-10-CM | POA: Diagnosis not present

## 2021-08-22 DIAGNOSIS — N183 Chronic kidney disease, stage 3 unspecified: Secondary | ICD-10-CM | POA: Diagnosis not present

## 2021-08-22 DIAGNOSIS — J449 Chronic obstructive pulmonary disease, unspecified: Secondary | ICD-10-CM | POA: Diagnosis not present

## 2021-08-22 DIAGNOSIS — Z9181 History of falling: Secondary | ICD-10-CM | POA: Diagnosis not present

## 2021-08-22 DIAGNOSIS — E039 Hypothyroidism, unspecified: Secondary | ICD-10-CM | POA: Diagnosis not present

## 2021-08-22 DIAGNOSIS — D62 Acute posthemorrhagic anemia: Secondary | ICD-10-CM | POA: Diagnosis not present

## 2021-08-22 DIAGNOSIS — K5909 Other constipation: Secondary | ICD-10-CM | POA: Diagnosis not present

## 2021-08-22 DIAGNOSIS — I129 Hypertensive chronic kidney disease with stage 1 through stage 4 chronic kidney disease, or unspecified chronic kidney disease: Secondary | ICD-10-CM | POA: Diagnosis not present

## 2021-08-22 DIAGNOSIS — K219 Gastro-esophageal reflux disease without esophagitis: Secondary | ICD-10-CM | POA: Diagnosis not present

## 2021-08-22 DIAGNOSIS — A310 Pulmonary mycobacterial infection: Secondary | ICD-10-CM | POA: Diagnosis not present

## 2021-08-22 NOTE — Telephone Encounter (Signed)
Diclofenac patches were too expensive.  Dr. Sedalia Muta advised OTC lidoderm patches.  Stephanie Clay was notified as well as Marine scientist.

## 2021-08-22 NOTE — Telephone Encounter (Signed)
[facility] called and stated she tried two different days with [name] to get a sputum culture. Patient could not get anything up either time. Nurse asked patient if she feels like something is wrong that she could get an x ray or another test and patient stated no I'm fine, this is my baseline. Marchelle Folks called to let us know that and that she has no skilled needed for her to keep going out to patient's house.

## 2021-08-28 ENCOUNTER — Telehealth: Payer: Self-pay

## 2021-08-28 ENCOUNTER — Other Ambulatory Visit: Payer: Self-pay | Admitting: Family Medicine

## 2021-08-28 DIAGNOSIS — I471 Supraventricular tachycardia: Secondary | ICD-10-CM | POA: Diagnosis not present

## 2021-08-28 DIAGNOSIS — K5909 Other constipation: Secondary | ICD-10-CM | POA: Diagnosis not present

## 2021-08-28 DIAGNOSIS — I129 Hypertensive chronic kidney disease with stage 1 through stage 4 chronic kidney disease, or unspecified chronic kidney disease: Secondary | ICD-10-CM | POA: Diagnosis not present

## 2021-08-28 DIAGNOSIS — Z86718 Personal history of other venous thrombosis and embolism: Secondary | ICD-10-CM | POA: Diagnosis not present

## 2021-08-28 DIAGNOSIS — E876 Hypokalemia: Secondary | ICD-10-CM | POA: Diagnosis not present

## 2021-08-28 DIAGNOSIS — Z9181 History of falling: Secondary | ICD-10-CM | POA: Diagnosis not present

## 2021-08-28 DIAGNOSIS — K219 Gastro-esophageal reflux disease without esophagitis: Secondary | ICD-10-CM | POA: Diagnosis not present

## 2021-08-28 DIAGNOSIS — A310 Pulmonary mycobacterial infection: Secondary | ICD-10-CM | POA: Diagnosis not present

## 2021-08-28 DIAGNOSIS — J449 Chronic obstructive pulmonary disease, unspecified: Secondary | ICD-10-CM | POA: Diagnosis not present

## 2021-08-28 DIAGNOSIS — F32A Depression, unspecified: Secondary | ICD-10-CM | POA: Diagnosis not present

## 2021-08-28 DIAGNOSIS — E875 Hyperkalemia: Secondary | ICD-10-CM | POA: Diagnosis not present

## 2021-08-28 DIAGNOSIS — N183 Chronic kidney disease, stage 3 unspecified: Secondary | ICD-10-CM | POA: Diagnosis not present

## 2021-08-28 DIAGNOSIS — E039 Hypothyroidism, unspecified: Secondary | ICD-10-CM | POA: Diagnosis not present

## 2021-08-28 DIAGNOSIS — D62 Acute posthemorrhagic anemia: Secondary | ICD-10-CM | POA: Diagnosis not present

## 2021-08-28 DIAGNOSIS — M419 Scoliosis, unspecified: Secondary | ICD-10-CM | POA: Diagnosis not present

## 2021-08-28 DIAGNOSIS — S72142D Displaced intertrochanteric fracture of left femur, subsequent encounter for closed fracture with routine healing: Secondary | ICD-10-CM | POA: Diagnosis not present

## 2021-08-28 NOTE — Telephone Encounter (Signed)
[facility] was called. kc

## 2021-08-28 NOTE — Telephone Encounter (Signed)
Stephanie Clay, Called daughter. Pt is only taking xanax once at night. You called [facility].

## 2021-08-28 NOTE — Telephone Encounter (Signed)
Patient's pharmacy called to question about patient's las refill that was sent in for her Stephanie Clay stating that for years it has always been 1 tablet twice daily and this past time it was sent in for Xanax 1 tablet daily and was wanting to verify before they have to refill it again that it is being changed. Please advise.

## 2021-08-29 ENCOUNTER — Other Ambulatory Visit: Payer: Self-pay

## 2021-08-29 ENCOUNTER — Other Ambulatory Visit: Payer: Medicare Other

## 2021-08-29 ENCOUNTER — Other Ambulatory Visit: Payer: Self-pay | Admitting: Family Medicine

## 2021-08-29 DIAGNOSIS — E038 Other specified hypothyroidism: Secondary | ICD-10-CM | POA: Diagnosis not present

## 2021-08-30 LAB — TSH: TSH: 1.97 u[IU]/mL (ref 0.450–4.500)

## 2021-09-04 ENCOUNTER — Other Ambulatory Visit: Payer: Self-pay | Admitting: Family Medicine

## 2021-09-04 ENCOUNTER — Other Ambulatory Visit: Payer: Self-pay

## 2021-09-04 ENCOUNTER — Telehealth: Payer: Self-pay

## 2021-09-04 MED ORDER — VORTIOXETINE HBR 10 MG PO TABS: 10.0000 mg | ORAL_TABLET | Freq: Every day | ORAL | 2 refills | Status: DC

## 2021-09-04 NOTE — Telephone Encounter (Signed)
Daughter calling asking next step for trintellix. Pt has one week left of supply and is currently taking 10 mg. IF script is sent requests it be sent to [facility].   Lorita Officer, CCMA 09/04/21 11:08 AM

## 2021-09-04 NOTE — Progress Notes (Signed)
Pt daughter confirmed telephone appt tomorrow with [facility].  Roxana Hires, CMA Clinical Pharmacist Assistant  [phone]

## 2021-09-04 NOTE — Telephone Encounter (Signed)
If pt feels it is helping, I would send rx for trintillex 10 mg once daily to pharmacy. 90/0. Will need PA.  Pt has been tried on numerous ssris, effexor with temporary improvement, but then worsens again. kc

## 2021-09-05 ENCOUNTER — Other Ambulatory Visit: Payer: Self-pay | Admitting: Family Medicine

## 2021-09-05 ENCOUNTER — Ambulatory Visit (INDEPENDENT_AMBULATORY_CARE_PROVIDER_SITE_OTHER): Payer: Medicare Other

## 2021-09-05 ENCOUNTER — Other Ambulatory Visit: Payer: Self-pay

## 2021-09-05 DIAGNOSIS — G250 Essential tremor: Secondary | ICD-10-CM

## 2021-09-05 DIAGNOSIS — E038 Other specified hypothyroidism: Secondary | ICD-10-CM

## 2021-09-05 DIAGNOSIS — J4540 Moderate persistent asthma, uncomplicated: Secondary | ICD-10-CM

## 2021-09-05 DIAGNOSIS — I10 Essential (primary) hypertension: Secondary | ICD-10-CM

## 2021-09-05 DIAGNOSIS — E782 Mixed hyperlipidemia: Secondary | ICD-10-CM

## 2021-09-05 DIAGNOSIS — F331 Major depressive disorder, recurrent, moderate: Secondary | ICD-10-CM

## 2021-09-05 MED ORDER — ONDANSETRON 4 MG PO TBDP: 4.0000 mg | ORAL_TABLET | Freq: Three times a day (TID) | ORAL | 2 refills | Status: AC | PRN

## 2021-09-05 NOTE — Telephone Encounter (Signed)
Called daughter, Eunice Blase. Daughter feels it is helping her. Rx sent 11/14 to [facility].  Lorita Officer, [place] 09/05/21 3:40 PM

## 2021-09-05 NOTE — Progress Notes (Signed)
Chronic Care Management Pharmacy Note  02/21/2021 Name:  Stephanie Clay MRN:  [id number] DOB:  Oct 10, 1932  Summary: Pleasant 87 year old female presents with her daughter/caregiver/roommate Stephanie Clay. 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 Stephanie Clay, the patient, lives with her daughter and is very frustrated with her medical situation. She is unable to walk due to breaking her leg then her hip recently. She goes to therapy but is sad that her therapist told her that she most likely will never be able to walk again. Her husband passed away years ago. She didn't have an answer to "What do you like to do for fun" but her daughter stated that Stephanie Clay used to love to read  Plan Updates:  Pharmacist coordinating patient assistance applications for [employer] and [employer] Patient tremor slightly better on Propranolol, seeing PCP in 2 weeks and will defer to PCP on if candidate for titration up Sent direct msg to PCP regarding refill of Ondansetron.   Subjective: Stephanie Clay is an 38 y.o. year old female who is a primary patient of Cox, Kirsten, MD.  The CCM team was consulted for assistance with disease management and care coordination needs.    Engaged with patient by telephone for F/U visit in response to provider referral for pharmacy case management and/or care coordination services.   Consent to Services:  The patient was given the following information about Chronic Care Management services today, agreed to services, and gave verbal consent: 1. CCM service includes personalized support from designated clinical staff supervised by the primary care provider, including individualized plan of care and coordination with other care providers 2. 24/7 contact phone numbers for assistance for urgent and routine care needs. 3. Service will only be billed when office clinical staff  spend 20 minutes or more in a month to coordinate care. 4. Only one practitioner may furnish and bill the service in a calendar month. 5.The patient may stop CCM services at any time (effective at the end of the month) by phone call to the office staff. 6. The patient will be responsible for cost sharing (co-pay) of up to 20% of the service fee (after annual deductible is met). Patient agreed to services and consent obtained.  Patient Care Team: Rochel Brome, MD as PCP - General (Family Medicine) Collene Gobble, MD as Consulting Physician (Pulmonary Disease)  Recent Office Visit:   02/17/2021: Rochel Brome, MD (PCP) / Added Budeson-Glycopyrrol-Formoterol 160-9-4.8 MCG/ACT, 2 puffs bid, increased Venlafaxine 75 mg. to 150 mg, 1 capsule po qam. D/C Fluticasone (change in therapy)   02/16/2021: Rochel Brome, MD (PCP) / for abd distention. Labs, urinalysis and CT chest w/ contrast, and CT abd/pelvis w/ contrast ordered / D/C Lubiprostone 24 mcg.    11/16/2020: Rochel Brome, MD (PCP) / for tremor /Added Ketoconozole 2% lotion, Lubiprostone 24 mcg., Primidone 50 mg., TAC Acetonide .1% cream . Change in therapy, d/c Linaclotide 290 mcg.   /10/21/2020: Rochel Brome, MD (PCP)  for abd pain / Labs ordered, D/C Ferrous Sulfate, Ferrous Glutonate, and Ferrous Fumarate (course completed) , D/C Oxycodone-Acetaminophen 5-325 mg. (change in therapy), D/C TAC Acetonide 1 cream (change in therapy)        Recent consult visits:    12/08/2020: Darra Lis, MD (Rheumatology) / ref. By Dr. Tobie Poet for joint pain.  Ordered B/L hand and foot XR's, L-spine XR's. D/C (patient  reported not taking), Hydrocodone-Acetaminophen 5-325, Ketoconazole 2% topical, Primidone 50 mg.     10/05/2020: Levin Erp, PA (Gastroenterology) / ref. by Dr. Tobie Poet for abd pain. D/C Megestrol 20 mg. , Increased Linaclotide from 145 mcg to 290 mcg. Take 1 before breakfast     Hospital/ ED visits:  09/19/2020: [facility] / [place]  compression, closed fx of T11-T12 / Fentanyl injection administered / labs obtained, bone bx, No discharge medications noted     Objective:  Lab Results  Component Value Date   CREATININE 1.09 (H) 10/03/2020   BUN 33 (H) 10/03/2020   GFR 48.24 (L) 04/07/2018   GFRNONAA 46 (L) 10/03/2020   GFRAA 53 (L) 10/03/2020   NA 141 10/03/2020   K 3.7 10/03/2020   CALCIUM 9.7 10/03/2020   CO2 26 10/03/2020   GLUCOSE 137 (H) 10/03/2020    Lab Results  Component Value Date/Time   HGBA1C 5.5 03/08/2020 12:13 PM   HGBA1C 5.6 07/01/2016 10:00 AM   GFR 48.24 (L) 04/07/2018 10:05 AM    Last diabetic Eye exam: No results found for: HMDIABEYEEXA  Last diabetic Foot exam: No results found for: HMDIABFOOTEX   Lab Results  Component Value Date   CHOL 193 09/09/2020   HDL 58 09/09/2020   LDLCALC 122 (H) 09/09/2020   TRIG 71 09/09/2020   CHOLHDL 3.3 09/09/2020    Hepatic Function Latest Ref Rng & Units 10/03/2020 09/09/2020 08/11/2020  Total Protein 6.0 - 8.5 g/dL 7.7 7.7 8.2  Albumin 3.6 - 4.6 g/dL 3.6 4.2 4.6  AST 0 - 40 IU/L _0 ALT 0 - 32 IU/L _1 Alk Phosphatase 44 - 121 IU/L 75 77 93  Total Bilirubin 0.0 - 1.2 mg/dL 0.2 0.3 0.2    Lab Results  Component Value Date/Time   TSH 4.370 10/03/2020 04:18 PM   TSH 3.780 08/11/2020 04:34 PM    CBC Latest Ref Rng & Units 10/03/2020 09/14/2020 09/09/2020  WBC 3.4 - 10.8 x10E3/uL 13.9(H) 13.8(H) 12.7(H)  Hemoglobin 11.1 - 15.9 g/dL 10.3(L) 11.6 11.2  Hematocrit 34.0 - 46.6 % 30.3(L) 34.1 32.5(L)  Platelets 150 - 450 x10E3/uL 421 341 345    Lab Results  Component Value Date/Time   VD25OH 67.2 12/07/2019 10:01 AM    Clinical ASCVD: No  The ASCVD Risk score Mikey Bussing DC Jr., et al., 2013) failed to calculate for the following reasons:   The 2013 ASCVD risk score is only valid for ages 78 to 48    Depression screen PHQ 2/9 02/16/2021 11/16/2020 03/08/2020  Decreased Interest 2 0 3  Down, Depressed, Hopeless 1 0 2  PHQ - 2  Score 3 0 5  Altered sleeping 0 - -  Tired, decreased energy 3 - -  Change in appetite 3 - -  Feeling bad or failure about yourself  3 - -  Trouble concentrating 1 - -  Moving slowly or fidgety/restless 0 - -  Suicidal thoughts 0 - -  PHQ-9 Score 13 - -     Social History   Tobacco Use  Smoking Status Never Smoker  Smokeless Tobacco Never Used   BP Readings from Last 3 Encounters:  02/17/21 (!) 144/80  02/16/21 (!) 144/64  12/08/20 128/72   Pulse Readings from Last 3 Encounters:  02/17/21 84  02/16/21 72  12/08/20 79   Wt Readings from Last 3 Encounters:  02/16/21 72 lb 3.2 oz (32.7 kg)  12/08/20 72 lb 9.6 oz (32.9 kg)  11/16/20  72 lb (32.7 kg)   BMI Readings from Last 3 Encounters:  02/16/21 14.10 kg/m  12/08/20 14.18 kg/m  11/16/20 14.54 kg/m    Assessment/Interventions: Review of patient past medical history, allergies, medications, health status, including review of consultants reports, laboratory and other test data, was performed as part of comprehensive evaluation and provision of chronic care management services.   SDOH:  (Social Determinants of Health) assessments and interventions performed: Yes  SDOH Screenings   Alcohol Screen: Not on file  Depression (PHQ2-9): Medium Risk   PHQ-2 Score: 13  Financial Resource Strain: Not on file  Food Insecurity: Not on file  Housing: Not on file  Physical Activity: Not on file  Social Connections: Not on file  Stress: Not on file  Tobacco Use: Low Risk    Smoking Tobacco Use: Never Smoker   Smokeless Tobacco Use: Never Used  Transportation Needs: Not on file    CCM Care Plan  Allergies  Allergen Reactions   Lisinopril Other (See Comments)   Remeron [Mirtazapine] Other (See Comments)   Ciprofloxacin     unknown   Levofloxacin     unknonw   Nitrofurantoin     unknown   Omnicef [Cefdinir] Diarrhea   Sertraline Hcl Other (See Comments)    Tremors    Medications Reviewed Today     Reviewed by  Rochel Brome, MD (Physician) on 02/17/21 at [place] List Status: <None>   Medication Order Taking? Sig Documenting Provider Last Dose Status Informant  ALPRAZolam (XANAX) 0.5 MG tablet [id number]  TAKE 1 TABLET BY MOUTH TWICE A DAY AS NEEDED Cox, Kirsten, MD  Active   Cholecalciferol (VITAMIN D3 PO) [id number] No Take by mouth daily. [provider] Taking Active   Cyanocobalamin 2500 MCG TABS [id number] No Take by mouth daily. [provider] Taking Active   diclofenac Sodium (VOLTAREN) 1 % GEL [id number] No Apply topically 4 (four) times daily. [provider] Taking Active   dicyclomine (BENTYL) 10 MG capsule [id number]  TAKE 1 CAPSULE BY MOUTH BEFORE EACH MEAL AND AT BEDTIME AS NEEDED FOR COLON SPASM Marge Duncans, PA-C  Active   fluticasone (FLONASE) 50 MCG/ACT nasal spray [id number]  USE 2 SPRAYS IN EACH NOSTRIL DAILY. Marge Duncans, PA-C  Active   Fluticasone-Umeclidin-Vilant (TRELEGY ELLIPTA) 100-62.5-25 MCG/INH AEPB [id number] No Inhale 1 puff into the lungs daily. Cox, Kirsten, MD Taking Active   gabapentin (NEURONTIN) 300 MG capsule [id number] No Take 1 capsule (300 mg total) by mouth 3 (three) times daily. Cox, Kirsten, MD Taking Active   hydrochlorothiazide (HYDRODIURIL) 25 MG tablet [id number]  TAKE 1 TABLET BY MOUTH DAILY Marge Duncans, PA-C  Active   HYDROcodone-acetaminophen (NORCO/VICODIN) 5-325 MG tablet [id number]  Take 1 tablet by mouth 2 (two) times daily as needed for severe pain (OSteoarthritis of lumbar spine.). Cox, Kirsten, MD  Active   hydrOXYzine (ATARAX/VISTARIL) 25 MG tablet [id number] No Take 25 mg by mouth 3 (three) times daily as needed. [provider] Taking Active Self  levothyroxine (SYNTHROID) 50 MCG tablet [id number]  TAKE 1 TABLET BY MOUTH DAILY Marge Duncans, PA-C  Active   lidocaine (LIDODERM) 5 % [id number] No Place 3 patches onto the skin every 12 (twelve) hours. [provider] Taking Active   LINZESS 290 MCG CAPS capsule  [id number] No Take 290 mcg by mouth every morning. [provider] Taking Active   meclizine (ANTIVERT) 12.5 MG tablet [id number] No TAKE 1 TABLET EVERY 8 HOURS AS NEEDED FOR Sherlyn Lick, MD  Taking Active   nitroGLYCERIN (NITROSTAT) 0.4 MG SL tablet [id number] No Place 0.4 mg under the tongue every 5 (five) minutes as needed for chest pain. X 3 doses [provider] Taking Active   ondansetron (ZOFRAN) 4 MG tablet [id number]  TAKE 1 TABLET BY MOUTH THREE TIMES A DAY FOR NAUSEA. Marge Duncans, PA-C  Active   pantoprazole (PROTONIX) 40 MG tablet [id number] No TAKE 1 TABLET TWICE DAILY Cox, Kirsten, MD Taking Active   potassium chloride SA (KLOR-CON) 20 MEQ tablet [id number]  TAKE 3 TABLETS BY MOUTH ONCE DAILY. Marge Duncans, PA-C  Active   simvastatin (ZOCOR) 40 MG tablet [id number] No TAKE 1 TABLET BY MOUTH DAILY Cox, Kirsten, MD Taking Active   torsemide (DEMADEX) 20 MG tablet [id number] No TAKE 1 TABLET BY MOUTH DAILY 5-6 HOURS AFTER FIRST DOSE OF HYDROCHLOROTHIAZIDE Lillard Anes, MD Taking Active   triamcinolone (KENALOG) 0.1 % paste [id number] No Use as directed 1 application in the mouth or throat 2 (two) times daily. Rochel Brome, MD Taking Active   Venlafaxine HCl 150 MG TB24 [id number] Yes Take 1 tablet (150 mg total) by mouth in the morning. Rochel Brome, MD  Active             Patient Active Problem List   Diagnosis Date Noted   Joint pain 03/24/2020   Decreased pedal pulses 03/24/2020   Severe protein-calorie malnutrition ([place]) 03/24/2020   Normocytic anemia 01/07/2020   Chronic bilateral low back pain without sciatica 11/29/2019   Mild vitamin D deficiency 11/29/2019   Localized edema 11/26/2019   Other fatigue 11/26/2019   Abdominal pain 01/27/2018   Allergic rhinitis 08/20/2016   Underweight 07/02/2016   Hypomagnesemia 07/01/2016   Hypokalemia 07/01/2016   Hyperglycemia 07/01/2016   Hypothyroidism 07/01/2016   Essential hypertension 07/01/2016    Hyperlipidemia 07/01/2016   AKI (acute kidney injury) ([place]) 06/30/2016   Dysphagia 03/01/2015   Bronchiectasis with evidence of chronic atypical infection 01/03/2015    Immunization History  Administered Date(s) Administered   Influenza, High Dose Seasonal PF 06/28/2017   Influenza,inj,Quad PF,6+ Mos 06/30/2015, 08/20/2016, 08/11/2020   Influenza-Unspecified 07/22/2014, 07/23/2019   Pneumococcal Conjugate-13 06/20/2015   Pneumococcal Polysaccharide-23 07/23/2007   Tdap 02/20/2018    Conditions to be addressed/monitored:  Hypertension, Hyperlipidemia, GERD, Hypothyroidism, Depression and COPD  There are no care plans that you recently modified to display for this patient.    Medication Assistance: Application for Breztri and Linzess  medication assistance program. in process.  Anticipated assistance start date 04/05/2021.  See plan of care for additional detail.  Patient's preferred pharmacy is:  [facility], [place] [address] Phone: [phone] Fax: [phone]  [facility], [place] [address] Phone: [phone] Fax: [phone]  Uses pill box? Yes Pt endorses good compliance - patient currently out of gabapentin   We discussed: Benefits of medication synchronization, packaging and delivery as well as enhanced pharmacist oversight with [employer]. Patient decided to: Continue current medication management strategy  Care Plan and Follow Up Patient Decision:  Patient agrees to Care Plan and Follow-up.  Plan: Telephone follow up appointment with care management team member scheduled for:  March 2023  [place] Constable, [place].D. - [phone]

## 2021-09-05 NOTE — Patient Instructions (Signed)
Visit Information   Goals Addressed   None    Patient Care Plan: CCM Pharmacy Care Plan     Problem Identified: htn, hld, copd   Priority: High  Onset Date: 03/01/2021     Long-Range Goal: Disease State Management   Start Date: 03/01/2021  Expected End Date: 03/01/2022  Recent Progress: On track  Priority: High  Note:   Current Barriers:  Unable to independently afford treatment regimen  Pharmacist Clinical Goal(s):  Patient will verbalize ability to afford treatment regimen through collaboration with PharmD and provider.   Interventions: 1:1 collaboration with Blane Ohara, MD regarding development and update of comprehensive plan of care as evidenced by provider attestation and co-signature Inter-disciplinary care team collaboration (see longitudinal plan of care) Comprehensive medication review performed; medication list updated in electronic medical record   Tremor -Controlled -Current treatment: Propranolol 20mg  BID November 2022: Seeing PCP in 2 weeks, said tremor is slightly better but still there. Recommend to increase Propranolol based on BP/HR until at goal  Cardio (BP goal <140/90) -Controlled -Current treatment: Propranolol 20mg  BID Diltiazem 30mg  TID ASA 325mg  -Medications previously tried:  atenolol-chlorthalidone, spironolactone -Current home readings: not checking  -Current dietary habits: has a good appetite per patient. Eats oatmeal, toast with jelly, sandwich, meat and vegetables.  -Current exercise habits: limited due to mobility -Denies hypotensive/hypertensive symptoms -Educated on BP goals and benefits of medications for prevention of heart attack, stroke and kidney damage; Daily salt intake goal < 2300 mg; -Counseled to monitor BP at home as needed, document, and provide log at future appointments -Recommended to continue current medication  Hyperlipidemia: (LDL goal < 100) -Not ideally controlled -Current treatment: simvastatin 40 mg  daily  -Medications previously tried: none reported  -Current dietary patterns: vegetables, meat, sandwich, oatmeal -Current exercise habits: limited due to mobility -Educated on Cholesterol goals;  Benefits of statin for ASCVD risk reduction; Importance of limiting foods high in cholesterol; -Recommended to continue current medication  Depression/Anxiety (Goal: manage symptoms) -Controlled -Current treatment: Alprazolam 0.5 mg bid prn  Trintellix (Too expensive) -Medications previously tried/failed: Hydroxyzine 25 mg tid prn, Venlafaxine (Being tapered down in November 2022 -PHQ9:  Depression screen [facility] 2/9 08/14/2021 07/13/2021 05/16/2021  Decreased Interest 3 0 0  Down, Depressed, Hopeless 3 1 0  PHQ - 2 Score 6 1 0  Altered sleeping 0 - -  Tired, decreased energy 3 - -  Change in appetite 3 - -  Feeling bad or failure about yourself  2 - -  Trouble concentrating 2 - -  Moving slowly or fidgety/restless 3 - -  Suicidal thoughts 0 - -  PHQ-9 Score 19 - -  Difficult doing work/chores Very difficult - -  -Educated on Benefits of medication for symptom control November 2022: Trintellix is too expensive. Will start PAP. Spent a long time going over how to taper Venlafaxine based on PCP note  Hypothyroid (Goal: manage symptoms) -Controlled Lab Results  Component Value Date   TSH 1.970 08/29/2021  -Current treatment  Levothyroxine 75 mcg daily -Medications previously tried: none reported  -Recommended to continue current medication  GERD/ abdominal pain/ nausea/ constipation (Goal: manage symptoms) -Controlled -Current treatment  dicyclomine 10 mg before each meal and at bedtime prn colon spasm Linzess 290 mcg capsule every morning Pantoprazole 40 mg bid  Ondansetron 4 mg tid prn nausea -Medications previously tried: none reported  November 2022: Patient out of refills of Ondansetron and hasn't been able to get. Unsure if because of Afib but it  is active on meds list. Will  ask PCP for refill ASAP  Osteoarthritis of lumbar spine  (Goal: manage pain) Pain Scale:  Without meds:    *November 2022: 5/10  With meds:    *November 2022: 1/10 -Controlled -Current treatment  Gabapentin 300 mg tid  Diclofenac 1% gel qid Lidocaine 5% 3 patches onto skin every 12 hours -Medications previously tried: none reported  -Recommended to continue current medication  COPD (Goal: manage symtpoms) -Controlled -Current treatment  Breztri-aerosphere 2 puffs twice daily  flonase 2 sprays each nostril daily -Medications previously tried: Trelegy  -Recommended to continue current medication November 2022: Breztri PAP   Patient Goals/Self-Care Activities Patient will:  - take medications as prescribed focus on medication adherence by pill box collaborate with provider on medication access solutions  Follow Up Plan: Telephone follow up appointment with care management team member scheduled for: Feb 2023  Artelia Laroche, [place].D. - [phone]       The patient verbalized understanding of instructions, educational materials, and care plan provided today and declined offer to receive copy of patient instructions, educational materials, and care plan.  The pharmacy team will reach out to the patient again over the next 90 days.   Zettie Pho, [facility]

## 2021-09-06 ENCOUNTER — Telehealth: Payer: Self-pay

## 2021-09-06 DIAGNOSIS — E876 Hypokalemia: Secondary | ICD-10-CM | POA: Diagnosis not present

## 2021-09-06 DIAGNOSIS — S72142D Displaced intertrochanteric fracture of left femur, subsequent encounter for closed fracture with routine healing: Secondary | ICD-10-CM | POA: Diagnosis not present

## 2021-09-06 DIAGNOSIS — E039 Hypothyroidism, unspecified: Secondary | ICD-10-CM | POA: Diagnosis not present

## 2021-09-06 DIAGNOSIS — F32A Depression, unspecified: Secondary | ICD-10-CM | POA: Diagnosis not present

## 2021-09-06 DIAGNOSIS — M419 Scoliosis, unspecified: Secondary | ICD-10-CM | POA: Diagnosis not present

## 2021-09-06 DIAGNOSIS — D62 Acute posthemorrhagic anemia: Secondary | ICD-10-CM | POA: Diagnosis not present

## 2021-09-06 DIAGNOSIS — K5909 Other constipation: Secondary | ICD-10-CM | POA: Diagnosis not present

## 2021-09-06 DIAGNOSIS — I129 Hypertensive chronic kidney disease with stage 1 through stage 4 chronic kidney disease, or unspecified chronic kidney disease: Secondary | ICD-10-CM | POA: Diagnosis not present

## 2021-09-06 DIAGNOSIS — K219 Gastro-esophageal reflux disease without esophagitis: Secondary | ICD-10-CM | POA: Diagnosis not present

## 2021-09-06 DIAGNOSIS — Z86718 Personal history of other venous thrombosis and embolism: Secondary | ICD-10-CM | POA: Diagnosis not present

## 2021-09-06 DIAGNOSIS — E875 Hyperkalemia: Secondary | ICD-10-CM | POA: Diagnosis not present

## 2021-09-06 DIAGNOSIS — N183 Chronic kidney disease, stage 3 unspecified: Secondary | ICD-10-CM | POA: Diagnosis not present

## 2021-09-06 DIAGNOSIS — Z9181 History of falling: Secondary | ICD-10-CM | POA: Diagnosis not present

## 2021-09-06 DIAGNOSIS — A310 Pulmonary mycobacterial infection: Secondary | ICD-10-CM | POA: Diagnosis not present

## 2021-09-06 DIAGNOSIS — I471 Supraventricular tachycardia: Secondary | ICD-10-CM | POA: Diagnosis not present

## 2021-09-06 DIAGNOSIS — J449 Chronic obstructive pulmonary disease, unspecified: Secondary | ICD-10-CM | POA: Diagnosis not present

## 2021-09-06 NOTE — Progress Notes (Signed)
    Chronic Care Management Pharmacy Assistant   Name: Ritu Gagliardo  MRN: [id number] DOB: November 26, 1931   Reason for Encounter: Patient Assistance Documentation   09/06/21 Application has been filled out through [place] for [employer] and uploaded to 2022 folder to be mailed out to patient.  09/06/21 Application and Enrollment has been submitted online through [place] for [employer].   Pt daughter, Stephanie Clay has been informed today.   Medications: Outpatient Encounter Medications as of 09/06/2021  Medication Sig   acetaminophen (TYLENOL) 325 MG tablet Take 650 mg by mouth every 6 (six) hours as needed for mild pain.   albuterol (VENTOLIN HFA) 108 (90 Base) MCG/ACT inhaler Inhale 2 puffs into the lungs every 6 (six) hours as needed for wheezing or shortness of breath.   ALPRAZolam (XANAX) 0.5 MG tablet Take 1 tablet (0.5 mg total) by mouth at bedtime.   aspirin 325 MG tablet Take 325 mg by mouth 3 (three) times daily before meals.   Budeson-Glycopyrrol-Formoterol (BREZTRI AEROSPHERE) 160-9-4.8 MCG/ACT AERO Inhale 2 puffs into the lungs 2 (two) times daily.   Cholecalciferol (VITAMIN D3 PO) Take 2,000 Units by mouth daily.   Cyanocobalamin 2500 MCG TABS Take 2,500 mcg by mouth daily.   diclofenac (FLECTOR) 1.3 % PTCH Place 1 patch onto the skin 2 (two) times daily.   dicyclomine (BENTYL) 10 MG capsule TAKE 1 CAPSULE BY MOUTH BEFORE EACH MEAL AND AT BEDTIME AS NEEDED FOR COLON SPASM   diltiazem (CARDIZEM) 30 MG tablet TAKE 1 TABLET BY MOUTH EVERY 8 HOURS   fluticasone (FLONASE) 50 MCG/ACT nasal spray Place 2 sprays into both nostrils as needed for allergies or rhinitis.   gabapentin (NEURONTIN) 300 MG capsule TAKE 1 CAPSULE BY MOUTH 2 TIMES DAILY   ketoconazole (NIZORAL) 2 % cream Twice daily to chin and corners of her mouth   levothyroxine (SYNTHROID) 75 MCG tablet Take 1 tablet (75 mcg total) by mouth daily.   nitroGLYCERIN (NITROSTAT) 0.4 MG SL tablet Place 0.4 mg under the tongue  every 5 (five) minutes as needed for chest pain. X 3 doses   nystatin (MYCOSTATIN) 100000 UNIT/ML suspension Take 5 mLs (500,000 Units total) by mouth 3 (three) times daily.   ondansetron (ZOFRAN-ODT) 4 MG disintegrating tablet Take 1 tablet (4 mg total) by mouth every 8 (eight) hours as needed.   pantoprazole (PROTONIX) 40 MG tablet TAKE 1 TABLET BY MOUTH TWICE DAILY   polyethylene glycol (MIRALAX / GLYCOLAX) 17 g packet Take 17 g by mouth 2 (two) times daily.   potassium chloride SA (KLOR-CON) 20 MEQ tablet TAKE 3 TABLETS BY MOUTH ONCE DAILY.   propranolol (INDERAL) 20 MG tablet Take 1 tablet (20 mg total) by mouth 2 (two) times daily.   sennosides-docusate sodium (SENOKOT-S) 8.6-50 MG tablet Take 1 tablet by mouth 2 (two) times daily.   simvastatin (ZOCOR) 10 MG tablet TAKE 1 TABLET BY MOUTH DAILY FOR CHOLESTEROL   vortioxetine HBr (TRINTELLIX) 10 MG TABS tablet Take 1 tablet (10 mg total) by mouth daily.   No facility-administered encounter medications on file as of 09/06/2021.   Roxana Hires, CMA Clinical Pharmacist Assistant  [phone]

## 2021-09-07 ENCOUNTER — Other Ambulatory Visit: Payer: Self-pay

## 2021-09-07 MED ORDER — BREZTRI AEROSPHERE 160-9-4.8 MCG/ACT IN AERO: 2.0000 | INHALATION_SPRAY | Freq: Two times a day (BID) | RESPIRATORY_TRACT | 3 refills | Status: AC

## 2021-09-18 ENCOUNTER — Other Ambulatory Visit: Payer: Self-pay | Admitting: Family Medicine

## 2021-09-20 DIAGNOSIS — F331 Major depressive disorder, recurrent, moderate: Secondary | ICD-10-CM

## 2021-09-20 DIAGNOSIS — I10 Essential (primary) hypertension: Secondary | ICD-10-CM

## 2021-09-20 DIAGNOSIS — E782 Mixed hyperlipidemia: Secondary | ICD-10-CM

## 2021-09-20 DIAGNOSIS — J4540 Moderate persistent asthma, uncomplicated: Secondary | ICD-10-CM

## 2021-09-20 DIAGNOSIS — E038 Other specified hypothyroidism: Secondary | ICD-10-CM | POA: Diagnosis not present

## 2021-09-22 ENCOUNTER — Ambulatory Visit (INDEPENDENT_AMBULATORY_CARE_PROVIDER_SITE_OTHER): Payer: Medicare Other | Admitting: Family Medicine

## 2021-09-22 ENCOUNTER — Other Ambulatory Visit: Payer: Self-pay

## 2021-09-22 ENCOUNTER — Telehealth: Payer: Self-pay

## 2021-09-22 VITALS — BP 124/84 | HR 60 | Temp 97.7°F | Resp 18 | Ht 59.0 in | Wt 71.0 lb

## 2021-09-22 DIAGNOSIS — G250 Essential tremor: Secondary | ICD-10-CM

## 2021-09-22 DIAGNOSIS — G8929 Other chronic pain: Secondary | ICD-10-CM

## 2021-09-22 DIAGNOSIS — R1084 Generalized abdominal pain: Secondary | ICD-10-CM | POA: Diagnosis not present

## 2021-09-22 DIAGNOSIS — M545 Low back pain, unspecified: Secondary | ICD-10-CM

## 2021-09-22 DIAGNOSIS — J479 Bronchiectasis, uncomplicated: Secondary | ICD-10-CM | POA: Diagnosis not present

## 2021-09-22 DIAGNOSIS — F331 Major depressive disorder, recurrent, moderate: Secondary | ICD-10-CM | POA: Diagnosis not present

## 2021-09-22 MED ORDER — HYDROCODONE-ACETAMINOPHEN 5-325 MG PO TABS: 1.0000 | ORAL_TABLET | Freq: Two times a day (BID) | ORAL | 0 refills | Status: AC | PRN

## 2021-09-22 NOTE — Telephone Encounter (Signed)
Per Dr. Sedalia Muta, [employer] PAP hasn't been received by Eunice Blase (Daughter). Was mailed on 09/06/21. Will ask Danielle to mail again. Tried calling patient and daughter a few times but realized they are at the office

## 2021-09-22 NOTE — Patient Instructions (Signed)
Recommend lidocaine 4% patches otc.  Start on hydrocodone/apap 5/325 mg one twice a day as needed for severe back pain  Continue propranolol 20 mg twice daily. Continue trintellix 10 mg once daily.

## 2021-09-22 NOTE — Progress Notes (Signed)
Subjective:  Patient ID: Stephanie Clay, female    DOB: Dec 26, 1931  Age: 87 y.o. MRN: [id number]  Chief Complaint  Patient presents with   Tremors   Abdominal Pain    HPI Pt is an 87 yo frail WF who presents for follow up of chronic back pain, tremor, and major derpession.  At her visit 85 weeks ago I started her on trintellix and titrated it up to 10 mg daily, while tapering off effexor.  I increased her propranolol to 20 mg one bid which has helped her tremor.  She continues to have severe back pain. Tylenol does not help. I prescribed a flector patch.   She has seen pulmonology. She has continue breathing issues. She is on breztri. Her diagnosis is asthma. She has had an abnormal CT of her chest concerning for MAI, for which I referred her to pulmonology 8-10 months ago. Pulmonology/patient has tried to get a sputum unsuccessfully. The treatment of MAI per pulmonology is fraught with side effects frequently and they are concerned she side effects would be very difficult  Patient has issues with chronic abdominal pain.  We have done a very thorough work-up in the past and everything has been negative.  Patient has dicyclomine 10 mg which she may take twice a day.  She does not take it regularly. Current Outpatient Medications on File Prior to Visit  Medication Sig Dispense Refill   acetaminophen (TYLENOL) 325 MG tablet Take 650 mg by mouth every 6 (six) hours as needed for mild pain.     albuterol (VENTOLIN HFA) 108 (90 Base) MCG/ACT inhaler Inhale 2 puffs into the lungs every 6 (six) hours as needed for wheezing or shortness of breath. 8 g 6   ALPRAZolam (XANAX) 0.5 MG tablet Take 1 tablet (0.5 mg total) by mouth at bedtime. 30 tablet 2   aspirin 325 MG tablet Take 325 mg by mouth 3 (three) times daily before meals.     Budeson-Glycopyrrol-Formoterol (BREZTRI AEROSPHERE) 160-9-4.8 MCG/ACT AERO Inhale 2 puffs into the lungs 2 (two) times daily. 10.7 g 3   Cholecalciferol  (VITAMIN D3 PO) Take 2,000 Units by mouth daily.     Cyanocobalamin 2500 MCG TABS Take 2,500 mcg by mouth daily.     diclofenac (FLECTOR) 1.3 % PTCH Place 1 patch onto the skin 2 (two) times daily. 60 patch 2   dicyclomine (BENTYL) 10 MG capsule TAKE 1 CAPSULE BY MOUTH BEFORE EACH MEAL AND AT BEDTIME AS NEEDED FOR COLON SPASM 120 capsule 0   diltiazem (CARDIZEM) 30 MG tablet TAKE 1 TABLET BY MOUTH EVERY 8 HOURS 270 tablet 0   fluticasone (FLONASE) 50 MCG/ACT nasal spray Place 2 sprays into both nostrils as needed for allergies or rhinitis.     gabapentin (NEURONTIN) 300 MG capsule TAKE 1 CAPSULE BY MOUTH 2 TIMES DAILY 180 capsule 0   ketoconazole (NIZORAL) 2 % cream Twice daily to chin and corners of her mouth 15 g 0   levothyroxine (SYNTHROID) 75 MCG tablet Take 1 tablet (75 mcg total) by mouth daily. 90 tablet 3   nitroGLYCERIN (NITROSTAT) 0.4 MG SL tablet Place 0.4 mg under the tongue every 5 (five) minutes as needed for chest pain. X 3 doses     nystatin (MYCOSTATIN) 100000 UNIT/ML suspension Take 5 mLs (500,000 Units total) by mouth 3 (three) times daily. 60 mL 3   ondansetron (ZOFRAN-ODT) 4 MG disintegrating tablet Take 1 tablet (4 mg total) by mouth every 8 (eight) hours as  needed. 40 tablet 2   pantoprazole (PROTONIX) 40 MG tablet TAKE 1 TABLET BY MOUTH TWICE DAILY 180 tablet 0   polyethylene glycol (MIRALAX / GLYCOLAX) 17 g packet Take 17 g by mouth 2 (two) times daily.     potassium chloride SA (KLOR-CON) 20 MEQ tablet TAKE 3 TABLETS BY MOUTH ONCE DAILY. 90 tablet 2   propranolol (INDERAL) 20 MG tablet Take 1 tablet (20 mg total) by mouth 2 (two) times daily. 60 tablet 1   sennosides-docusate sodium (SENOKOT-S) 8.6-50 MG tablet Take 1 tablet by mouth 2 (two) times daily.     simvastatin (ZOCOR) 10 MG tablet TAKE 1 TABLET BY MOUTH DAILY FOR CHOLESTEROL 90 tablet 0   vortioxetine HBr (TRINTELLIX) 10 MG TABS tablet Take 1 tablet (10 mg total) by mouth daily. 30 tablet 2   No current  facility-administered medications on file prior to visit.   Past Medical History:  Diagnosis Date   Abdominal pain 01/27/2018   AKI (acute kidney injury) ([place]) 06/30/2016   Allergic rhinitis 08/20/2016   Anemia    Aneurysm of other specified arteries ([place])    Anxiety    Arthritis    Asthma    Bronchiectasis with evidence of chronic atypical infection 01/03/2015   CT chest 12/2014:  Bronchiectasis throughout right lung, left clear.  +tree and bud, + patchy gg infiltrates in RLL Sputum 2016: no growth, AFB smear neg PFT's 02/2015:  Normal except mild decrease in DLCO   Bronchiectasis without acute exacerbation ([place]) 01/03/2015   CT chest 12/2014:  Bronchiectasis throughout right lung, left clear.  +tree and bud, + patchy gg infiltrates in RLL Sputum 2016: no growth, AFB smear neg PFT's 02/2015:  Normal except mild decrease in DLCO    Cataract    bilateral eye surgery   Chronic bilateral low back pain without sciatica 11/29/2019   Chronic chest pain    Chronic idiopathic constipation    Chronic kidney disease, stage 3 (HCC)    Chronic venous hypertension (idiopathic) with other complications of bilateral lower extremity    Decreased pedal pulses 03/24/2020   Depression    Dysphagia 03/01/2015   Essential hypertension 07/01/2016   Gastro-esophageal reflux    Heart murmur    Hyperglycemia 07/01/2016   Hyperlipemia    Hyperlipidemia 07/01/2016   Hypertension    Hypokalemia 07/01/2016   Hypomagnesemia 07/01/2016   Hypothyroidism 07/01/2016   Idiopathic progressive neuropathy    Iron (Fe) deficiency anemia    Joint pain 03/24/2020   Localized edema 11/26/2019   Mild vitamin D deficiency 11/29/2019   Moderate protein-calorie malnutrition (HCC)    Normocytic anemia 01/07/2020   Other fatigue 11/26/2019   Other idiopathic scoliosis, lumbar region    Pneumathemia ([place])    Primary generalized (osteo)arthritis    Primary insomnia    Radiculopathy, lumbar region    Scoliosis    Secondary hyperparathyroidism  ([place])    Severe protein-calorie malnutrition ([place]) 03/24/2020   Underweight 07/02/2016   Unilateral primary osteoarthritis, left knee    Unilateral primary osteoarthritis, right knee    Past Surgical History:  Procedure Laterality Date   ABDOMINAL HYSTERECTOMY     CYSTOCELE REPAIR     SALPINGOOPHORECTOMY      Family History  Problem Relation Age of Onset   Emphysema Father    Heart disease Mother    Cancer Sister        metastatic lung cancer   Heart disease Sister    Hypertension Sister  Varicose Veins Sister    Hyperlipidemia Daughter    Hypertension Daughter    Diabetes Daughter    Hyperlipidemia Daughter    Hypertension Daughter    Thyroid disease Daughter    Thyroid disease Daughter    Colon cancer Neg Hx    Esophageal cancer Neg Hx    Stomach cancer Neg Hx    Social History   Socioeconomic History   Marital status: Widowed    Spouse name: carl   Number of children: 3   Years of education: 9   Highest education level: Not on file  Occupational History   Occupation: retired  Tobacco Use   Smoking status: Never   Smokeless tobacco: Never  Vaping Use   Vaping Use: Never used  Substance and Sexual Activity   Alcohol use: No    Alcohol/week: 0.0 standard drinks   Drug use: No   Sexual activity: Not Currently  Other Topics Concern   Not on file  Social History Narrative   Right handed   Social Determinants of Health   Financial Resource Strain: High Risk   Difficulty of Paying Living Expenses: Hard  Food Insecurity: No Food Insecurity   Worried About Running Out of Food in the Last Year: Never true   Ran Out of Food in the Last Year: Never true  Transportation Needs: No Transportation Needs   Lack of Transportation (Medical): No   Lack of Transportation (Non-Medical): No  Physical Activity: Not on file  Stress: Not on file  Social Connections: Not on file    Review of Systems  Constitutional:  Negative for chills, fatigue and fever.  HENT:   Negative for congestion, rhinorrhea and sore throat.   Respiratory:  Positive for cough and shortness of breath.   Cardiovascular:  Negative for chest pain.  Gastrointestinal:  Positive for abdominal pain and nausea. Negative for constipation, diarrhea and vomiting.  Genitourinary:  Negative for dysuria and urgency.  Musculoskeletal:  Positive for arthralgias and back pain. Negative for myalgias.  Neurological:  Negative for dizziness, weakness, light-headedness and headaches.  Psychiatric/Behavioral:  Negative for dysphoric mood. The patient is not nervous/anxious.     Objective:  BP 124/84   Pulse 60   Temp 97.7 F (36.5 C)   Resp 18   Ht 4\' 11"  (1.499 m)   Wt 71 lb (32.2 kg)   BMI 14.34 kg/m   BP/Weight 09/22/2021 08/14/2021 08/01/2021  Systolic BP 124 130 136  Diastolic BP 84 64 78  Wt. (Lbs) 71 70.6 73.2  BMI 14.34 14.26 14.78    Physical Exam Vitals reviewed.  Constitutional:      Appearance: Normal appearance.     Comments: Thin, frail  Neck:     Vascular: No carotid bruit.  Cardiovascular:     Rate and Rhythm: Normal rate and regular rhythm.     Pulses: Normal pulses.     Heart sounds: Normal heart sounds.  Pulmonary:     Effort: Pulmonary effort is normal. No respiratory distress.     Breath sounds: Wheezing (Diffuse end expiratory.) present.  Abdominal:     General: Abdomen is flat. Bowel sounds are normal.     Palpations: Abdomen is soft.     Tenderness: There is generalized abdominal tenderness.  Neurological:     Mental Status: She is alert and oriented to person, place, and time.  Psychiatric:        Mood and Affect: Mood normal.        Behavior: Behavior  normal.    Diabetic Foot Exam - Simple   No data filed      Lab Results  Component Value Date   WBC 11.7 (H) 06/29/2021   HGB 11.3 06/29/2021   HCT 34.7 06/29/2021   PLT 302 06/29/2021   GLUCOSE 76 06/29/2021   CHOL 185 05/16/2021   TRIG 83 05/16/2021   HDL 71 05/16/2021   LDLCALC 99  05/16/2021   ALT 7 06/29/2021   AST 17 06/29/2021   NA 141 06/29/2021   K 4.4 06/29/2021   CL 98 06/29/2021   CREATININE 0.82 06/29/2021   BUN 26 06/29/2021   CO2 28 06/29/2021   TSH 1.970 08/29/2021   HGBA1C 5.5 03/08/2020      Assessment & Plan:   Problem List Items Addressed This Visit       Respiratory   Bronchiectasis without acute exacerbation ([place]) - Primary    Continue Breztri 2 puffs twice daily. Follow-up with pulmonology.        Nervous and Auditory   Benign essential tremor    Improved. Continue propranolol 20 mg twice daily.         Other   Generalized abdominal pain    Negative GI work-up previously.  Symptoms likely IBS. recommend take the Bentyl before every meal and nightly.      Chronic bilateral low back pain without sciatica    Recommend lidocaine 4% patches otc.  Start on hydrocodone/apap 5/325 mg one twice a day as needed for severe back pain        Relevant Medications   HYDROcodone-acetaminophen (NORCO/VICODIN) 5-325 MG tablet   Moderate recurrent major depression ([place])    Improved.  Continue Trintellix 10 mg once daily.     .  Meds ordered this encounter  Medications   HYDROcodone-acetaminophen (NORCO/VICODIN) 5-325 MG tablet    Sig: Take 1 tablet by mouth 2 (two) times daily as needed for severe pain (OSteoarthritis of lumbar spine.).    Dispense:  60 tablet    Refill:  0    No orders of the defined types were placed in this encounter.    Follow-up: Return in about 6 weeks (around 11/03/2021) for chronic follow up.  An After Visit Summary was printed and given to the patient.  Rochel Brome, MD [facility] [phone]

## 2021-10-07 ENCOUNTER — Encounter: Payer: Self-pay | Admitting: Family Medicine

## 2021-10-07 NOTE — Assessment & Plan Note (Signed)
Improved.  Continue Trintellix 10 mg once daily.

## 2021-10-07 NOTE — Assessment & Plan Note (Signed)
Improved. Continue propranolol 20 mg twice daily.

## 2021-10-07 NOTE — Assessment & Plan Note (Signed)
Recommend lidocaine 4% patches otc.  Start on hydrocodone/apap 5/325 mg one twice a day as needed for severe back pain

## 2021-10-07 NOTE — Assessment & Plan Note (Signed)
Continue Breztri 2 puffs twice daily. Follow-up with pulmonology.

## 2021-10-07 NOTE — Assessment & Plan Note (Signed)
Negative GI work-up previously.  Symptoms likely IBS. recommend take the Bentyl before every meal and nightly.

## 2021-10-08 DIAGNOSIS — R9389 Abnormal findings on diagnostic imaging of other specified body structures: Secondary | ICD-10-CM | POA: Diagnosis not present

## 2021-10-10 DIAGNOSIS — Z8781 Personal history of (healed) traumatic fracture: Secondary | ICD-10-CM | POA: Diagnosis not present

## 2021-10-10 DIAGNOSIS — Z9889 Other specified postprocedural states: Secondary | ICD-10-CM | POA: Diagnosis not present

## 2021-10-10 DIAGNOSIS — S72002A Fracture of unspecified part of neck of left femur, initial encounter for closed fracture: Secondary | ICD-10-CM | POA: Diagnosis not present

## 2021-10-11 ENCOUNTER — Telehealth: Payer: Self-pay

## 2021-10-11 NOTE — Progress Notes (Signed)
° ° °  Chronic Care Management Pharmacy Assistant   Name: Stephanie Clay  MRN: [id number] DOB: January 08, 1932   Reason for Encounter: Patient Assistance Documentation   I called daughter of pt to verify pt did or did not receive an application renewal for 2023 for her Linzess. She is in our log for this medication and I do not see where this was sent. I am awaiting on a call back.      Medications: Outpatient Encounter Medications as of 10/11/2021  Medication Sig   acetaminophen (TYLENOL) 325 MG tablet Take 650 mg by mouth every 6 (six) hours as needed for mild pain.   albuterol (VENTOLIN HFA) 108 (90 Base) MCG/ACT inhaler Inhale 2 puffs into the lungs every 6 (six) hours as needed for wheezing or shortness of breath.   ALPRAZolam (XANAX) 0.5 MG tablet Take 1 tablet (0.5 mg total) by mouth at bedtime.   aspirin 325 MG tablet Take 325 mg by mouth 3 (three) times daily before meals.   Budeson-Glycopyrrol-Formoterol (BREZTRI AEROSPHERE) 160-9-4.8 MCG/ACT AERO Inhale 2 puffs into the lungs 2 (two) times daily.   Cholecalciferol (VITAMIN D3 PO) Take 2,000 Units by mouth daily.   Cyanocobalamin 2500 MCG TABS Take 2,500 mcg by mouth daily.   diclofenac (FLECTOR) 1.3 % PTCH Place 1 patch onto the skin 2 (two) times daily.   dicyclomine (BENTYL) 10 MG capsule TAKE 1 CAPSULE BY MOUTH BEFORE EACH MEAL AND AT BEDTIME AS NEEDED FOR COLON SPASM   diltiazem (CARDIZEM) 30 MG tablet TAKE 1 TABLET BY MOUTH EVERY 8 HOURS   fluticasone (FLONASE) 50 MCG/ACT nasal spray Place 2 sprays into both nostrils as needed for allergies or rhinitis.   gabapentin (NEURONTIN) 300 MG capsule TAKE 1 CAPSULE BY MOUTH 2 TIMES DAILY   HYDROcodone-acetaminophen (NORCO/VICODIN) 5-325 MG tablet Take 1 tablet by mouth 2 (two) times daily as needed for severe pain (OSteoarthritis of lumbar spine.).   ketoconazole (NIZORAL) 2 % cream Twice daily to chin and corners of her mouth   levothyroxine (SYNTHROID) 75 MCG tablet Take  1 tablet (75 mcg total) by mouth daily.   nitroGLYCERIN (NITROSTAT) 0.4 MG SL tablet Place 0.4 mg under the tongue every 5 (five) minutes as needed for chest pain. X 3 doses   nystatin (MYCOSTATIN) 100000 UNIT/ML suspension Take 5 mLs (500,000 Units total) by mouth 3 (three) times daily.   ondansetron (ZOFRAN-ODT) 4 MG disintegrating tablet Take 1 tablet (4 mg total) by mouth every 8 (eight) hours as needed.   pantoprazole (PROTONIX) 40 MG tablet TAKE 1 TABLET BY MOUTH TWICE DAILY   polyethylene glycol (MIRALAX / GLYCOLAX) 17 g packet Take 17 g by mouth 2 (two) times daily.   potassium chloride SA (KLOR-CON) 20 MEQ tablet TAKE 3 TABLETS BY MOUTH ONCE DAILY.   propranolol (INDERAL) 20 MG tablet Take 1 tablet (20 mg total) by mouth 2 (two) times daily.   sennosides-docusate sodium (SENOKOT-S) 8.6-50 MG tablet Take 1 tablet by mouth 2 (two) times daily.   simvastatin (ZOCOR) 10 MG tablet TAKE 1 TABLET BY MOUTH DAILY FOR CHOLESTEROL   vortioxetine HBr (TRINTELLIX) 10 MG TABS tablet Take 1 tablet (10 mg total) by mouth daily.   No facility-administered encounter medications on file as of 10/11/2021.    Roxana Hires, CMA Clinical Pharmacist Assistant  [phone]

## 2021-10-25 ENCOUNTER — Other Ambulatory Visit: Payer: Self-pay | Admitting: Family Medicine

## 2021-10-30 ENCOUNTER — Telehealth: Payer: Self-pay

## 2021-10-30 NOTE — Progress Notes (Signed)
Chronic Care Management Pharmacy Assistant   Name: Stephanie Clay  MRN: [id number] DOB: [birth year]   Reason for Encounter: Disease State/General Adherence Call    Recent office visits:   09/22/2021 Blane Ohara MD (PCP) start Hydrocodone-Acetaminophen 5-325 mg 1 tablet oral 2 times daily prn  Recent consult visits:  None  Hospital visits:  None in previous 6 months  Medications: Outpatient Encounter Medications as of 10/30/2021  Medication Sig   acetaminophen (TYLENOL) 325 MG tablet Take 650 mg by mouth every 6 (six) hours as needed for mild pain.   albuterol (VENTOLIN HFA) 108 (90 Base) MCG/ACT inhaler Inhale 2 puffs into the lungs every 6 (six) hours as needed for wheezing or shortness of breath.   ALPRAZolam (XANAX) 0.5 MG tablet Take 1 tablet (0.5 mg total) by mouth at bedtime.   aspirin 325 MG tablet Take 325 mg by mouth 3 (three) times daily before meals.   Budeson-Glycopyrrol-Formoterol (BREZTRI AEROSPHERE) 160-9-4.8 MCG/ACT AERO Inhale 2 puffs into the lungs 2 (two) times daily.   Cholecalciferol (VITAMIN D3 PO) Take 2,000 Units by mouth daily.   Cyanocobalamin 2500 MCG TABS Take 2,500 mcg by mouth daily.   diclofenac (FLECTOR) 1.3 % PTCH Place 1 patch onto the skin 2 (two) times daily.   dicyclomine (BENTYL) 10 MG capsule TAKE 1 CAPSULE BY MOUTH BEFORE EACH MEAL AND AT BEDTIME AS NEEDED FOR COLON SPASM   diltiazem (CARDIZEM) 30 MG tablet TAKE 1 TABLET BY MOUTH EVERY 8 HOURS   fluticasone (FLONASE) 50 MCG/ACT nasal spray Place 2 sprays into both nostrils as needed for allergies or rhinitis.   gabapentin (NEURONTIN) 300 MG capsule TAKE 1 CAPSULE BY MOUTH 2 TIMES DAILY   HYDROcodone-acetaminophen (NORCO/VICODIN) 5-325 MG tablet Take 1 tablet by mouth 2 (two) times daily as needed for severe pain (OSteoarthritis of lumbar spine.).   ketoconazole (NIZORAL) 2 % cream Twice daily to chin and corners of her mouth   levothyroxine (SYNTHROID) 75 MCG tablet Take 1  tablet (75 mcg total) by mouth daily.   nitroGLYCERIN (NITROSTAT) 0.4 MG SL tablet Place 0.4 mg under the tongue every 5 (five) minutes as needed for chest pain. X 3 doses   nystatin (MYCOSTATIN) 100000 UNIT/ML suspension Take 5 mLs (500,000 Units total) by mouth 3 (three) times daily.   ondansetron (ZOFRAN-ODT) 4 MG disintegrating tablet Take 1 tablet (4 mg total) by mouth every 8 (eight) hours as needed.   pantoprazole (PROTONIX) 40 MG tablet TAKE 1 TABLET BY MOUTH TWICE DAILY   polyethylene glycol (MIRALAX / GLYCOLAX) 17 g packet Take 17 g by mouth 2 (two) times daily.   potassium chloride SA (KLOR-CON) 20 MEQ tablet TAKE 3 TABLETS BY MOUTH ONCE DAILY.   propranolol (INDERAL) 20 MG tablet TAKE 1 TABLET BY MOUTH 2 TIMES DAILY.   sennosides-docusate sodium (SENOKOT-S) 8.6-50 MG tablet Take 1 tablet by mouth 2 (two) times daily.   simvastatin (ZOCOR) 10 MG tablet TAKE 1 TABLET BY MOUTH DAILY FOR CHOLESTEROL   vortioxetine HBr (TRINTELLIX) 10 MG TABS tablet Take 1 tablet (10 mg total) by mouth daily.   No facility-administered encounter medications on file as of 10/30/2021.   Called patient 10/30/2021 left message. Called patient 11/02/2021 left message. Called patient 11/03/2021 left message.  Care Gaps: Last annual wellness visit- N/A Last eye exam / retinopathy screening- N/A Diabetic foot exam- N/A  Star Medications: Medication Name/mg Last Fill Days Supply  Simvastatin 40 mg  10/11, 11/07        30/90 DS   Doreen Beam Clinical Pharmacist Assistant [phone]

## 2021-11-02 ENCOUNTER — Other Ambulatory Visit: Payer: Self-pay

## 2021-11-02 ENCOUNTER — Encounter: Payer: Self-pay | Admitting: Family Medicine

## 2021-11-02 ENCOUNTER — Ambulatory Visit (INDEPENDENT_AMBULATORY_CARE_PROVIDER_SITE_OTHER): Payer: Medicare Other | Admitting: Family Medicine

## 2021-11-02 VITALS — BP 128/80 | HR 76 | Temp 98.3°F | Ht 59.0 in | Wt <= 1120 oz

## 2021-11-02 DIAGNOSIS — E782 Mixed hyperlipidemia: Secondary | ICD-10-CM | POA: Diagnosis not present

## 2021-11-02 DIAGNOSIS — R1084 Generalized abdominal pain: Secondary | ICD-10-CM | POA: Diagnosis not present

## 2021-11-02 DIAGNOSIS — J4540 Moderate persistent asthma, uncomplicated: Secondary | ICD-10-CM

## 2021-11-02 DIAGNOSIS — I10 Essential (primary) hypertension: Secondary | ICD-10-CM | POA: Diagnosis not present

## 2021-11-02 DIAGNOSIS — M4125 Other idiopathic scoliosis, thoracolumbar region: Secondary | ICD-10-CM

## 2021-11-02 DIAGNOSIS — B9689 Other specified bacterial agents as the cause of diseases classified elsewhere: Secondary | ICD-10-CM | POA: Diagnosis not present

## 2021-11-02 DIAGNOSIS — J42 Unspecified chronic bronchitis: Secondary | ICD-10-CM

## 2021-11-02 DIAGNOSIS — E038 Other specified hypothyroidism: Secondary | ICD-10-CM | POA: Diagnosis not present

## 2021-11-02 DIAGNOSIS — G250 Essential tremor: Secondary | ICD-10-CM | POA: Diagnosis not present

## 2021-11-02 DIAGNOSIS — K5904 Chronic idiopathic constipation: Secondary | ICD-10-CM

## 2021-11-02 MED ORDER — DICYCLOMINE HCL 20 MG PO TABS: 20.0000 mg | ORAL_TABLET | Freq: Three times a day (TID) | ORAL | 3 refills | Status: AC

## 2021-11-02 MED ORDER — SULFAMETHOXAZOLE-TRIMETHOPRIM 400-80 MG PO TABS: 2.0000 | ORAL_TABLET | Freq: Two times a day (BID) | ORAL | 0 refills | Status: DC

## 2021-11-02 NOTE — Patient Instructions (Signed)
Start on bactrim SS 2 twice a day x 10 days.  Increase dicyclomine to 20 mg 1 before meals and before bedtime.

## 2021-11-02 NOTE — Progress Notes (Signed)
Subjective:  Patient ID: Stephanie Clay, female    DOB: [birth year]  Age: 88 y.o. MRN: [id number]  Chief Complaint  Patient presents with   Hyperlipidemia   Hypertension   HPI: Patient is an 88 year old white female with history of chronic lung issues.  She has been trying to get a sputum for nearly 9 months and was able to get 1 in December.  It grew out achromobacter denitrificans. sensitive to oral meds bactrim and levaquin. pt is allergic to levaquin. Pt continues to cough all day. Has DOE.   Hypertension: Initial BP check was low, but I rechecked and bp is 128/80. Pt is not dizzy. She has not had any further falls.   Tremor: still frustrating for pt. On propranolol 20 mg one twice a day.   Pt is also complaining of abdominal cramping after meals. Bentyl 10 mg qac and qhs helps, but not enough. Pt lost 2 more lbs.    Current Outpatient Medications on File Prior to Visit  Medication Sig Dispense Refill   acetaminophen (TYLENOL) 325 MG tablet Take 650 mg by mouth every 6 (six) hours as needed for mild pain.     albuterol (VENTOLIN HFA) 108 (90 Base) MCG/ACT inhaler Inhale 2 puffs into the lungs every 6 (six) hours as needed for wheezing or shortness of breath. 8 g 6   ALPRAZolam (XANAX) 0.5 MG tablet Take 1 tablet (0.5 mg total) by mouth at bedtime. 30 tablet 2   aspirin 325 MG tablet Take 325 mg by mouth 3 (three) times daily before meals.     Budeson-Glycopyrrol-Formoterol (BREZTRI AEROSPHERE) 160-9-4.8 MCG/ACT AERO Inhale 2 puffs into the lungs 2 (two) times daily. 10.7 g 3   Cholecalciferol (VITAMIN D3 PO) Take 2,000 Units by mouth daily.     Cyanocobalamin 2500 MCG TABS Take 2,500 mcg by mouth daily.     diclofenac (FLECTOR) 1.3 % PTCH Place 1 patch onto the skin 2 (two) times daily. 60 patch 2   diltiazem (CARDIZEM) 30 MG tablet TAKE 1 TABLET BY MOUTH EVERY 8 HOURS 270 tablet 0   fluticasone (FLONASE) 50 MCG/ACT nasal spray Place 2 sprays into both nostrils as  needed for allergies or rhinitis.     gabapentin (NEURONTIN) 300 MG capsule TAKE 1 CAPSULE BY MOUTH 2 TIMES DAILY 180 capsule 0   HYDROcodone-acetaminophen (NORCO/VICODIN) 5-325 MG tablet Take 1 tablet by mouth 2 (two) times daily as needed for severe pain (OSteoarthritis of lumbar spine.). 60 tablet 0   ketoconazole (NIZORAL) 2 % cream Twice daily to chin and corners of her mouth 15 g 0   levothyroxine (SYNTHROID) 75 MCG tablet Take 1 tablet (75 mcg total) by mouth daily. 90 tablet 3   nitroGLYCERIN (NITROSTAT) 0.4 MG SL tablet Place 0.4 mg under the tongue every 5 (five) minutes as needed for chest pain. X 3 doses     nystatin (MYCOSTATIN) 100000 UNIT/ML suspension Take 5 mLs (500,000 Units total) by mouth 3 (three) times daily. 60 mL 3   ondansetron (ZOFRAN-ODT) 4 MG disintegrating tablet Take 1 tablet (4 mg total) by mouth every 8 (eight) hours as needed. 40 tablet 2   pantoprazole (PROTONIX) 40 MG tablet TAKE 1 TABLET BY MOUTH TWICE DAILY 180 tablet 0   polyethylene glycol (MIRALAX / GLYCOLAX) 17 g packet Take 17 g by mouth 2 (two) times daily.     potassium chloride SA (KLOR-CON) 20 MEQ tablet TAKE 3 TABLETS BY MOUTH ONCE DAILY. 90 tablet 2  propranolol (INDERAL) 20 MG tablet TAKE 1 TABLET BY MOUTH 2 TIMES DAILY. 180 tablet 0   sennosides-docusate sodium (SENOKOT-S) 8.6-50 MG tablet Take 1 tablet by mouth 2 (two) times daily.     simvastatin (ZOCOR) 10 MG tablet TAKE 1 TABLET BY MOUTH DAILY FOR CHOLESTEROL 90 tablet 0   vortioxetine HBr (TRINTELLIX) 10 MG TABS tablet Take 1 tablet (10 mg total) by mouth daily. 30 tablet 2   No current facility-administered medications on file prior to visit.   Past Medical History:  Diagnosis Date   Abdominal pain 01/27/2018   AKI (acute kidney injury) ([place]) 06/30/2016   Allergic rhinitis 08/20/2016   Anemia    Aneurysm of other specified arteries ([place])    Anxiety    Arthritis    Asthma    Bronchiectasis with evidence of chronic atypical infection  01/03/2015   CT chest 12/2014:  Bronchiectasis throughout right lung, left clear.  +tree and bud, + patchy gg infiltrates in RLL Sputum 2016: no growth, AFB smear neg PFT's 02/2015:  Normal except mild decrease in DLCO   Bronchiectasis without acute exacerbation ([place]) 01/03/2015   CT chest 12/2014:  Bronchiectasis throughout right lung, left clear.  +tree and bud, + patchy gg infiltrates in RLL Sputum 2016: no growth, AFB smear neg PFT's 02/2015:  Normal except mild decrease in DLCO    Cataract    bilateral eye surgery   Chronic bilateral low back pain without sciatica 11/29/2019   Chronic chest pain    Chronic idiopathic constipation    Chronic kidney disease, stage 3 (HCC)    Chronic venous hypertension (idiopathic) with other complications of bilateral lower extremity    Decreased pedal pulses 03/24/2020   Depression    Dysphagia 03/01/2015   Essential hypertension 07/01/2016   Gastro-esophageal reflux    Heart murmur    Hyperglycemia 07/01/2016   Hyperlipemia    Hyperlipidemia 07/01/2016   Hypertension    Hypokalemia 07/01/2016   Hypomagnesemia 07/01/2016   Hypothyroidism 07/01/2016   Idiopathic progressive neuropathy    Iron (Fe) deficiency anemia    Joint pain 03/24/2020   Localized edema 11/26/2019   Mild vitamin D deficiency 11/29/2019   Moderate protein-calorie malnutrition (HCC)    Normocytic anemia 01/07/2020   Other fatigue 11/26/2019   Other idiopathic scoliosis, lumbar region    Pneumathemia ([place])    Primary generalized (osteo)arthritis    Primary insomnia    Radiculopathy, lumbar region    Scoliosis    Secondary hyperparathyroidism ([place])    Severe protein-calorie malnutrition ([place]) 03/24/2020   Underweight 07/02/2016   Unilateral primary osteoarthritis, left knee    Unilateral primary osteoarthritis, right knee    Past Surgical History:  Procedure Laterality Date   ABDOMINAL HYSTERECTOMY     CYSTOCELE REPAIR     SALPINGOOPHORECTOMY      Family History  Problem Relation Age of  Onset   Emphysema Father    Heart disease Mother    Cancer Sister        metastatic lung cancer   Heart disease Sister    Hypertension Sister    Varicose Veins Sister    Hyperlipidemia Daughter    Hypertension Daughter    Diabetes Daughter    Hyperlipidemia Daughter    Hypertension Daughter    Thyroid disease Daughter    Thyroid disease Daughter    Colon cancer Neg Hx    Esophageal cancer Neg Hx    Stomach cancer Neg Hx    Social History  Socioeconomic History   Marital status: Widowed    Spouse name: carl   Number of children: 3   Years of education: 9   Highest education level: Not on file  Occupational History   Occupation: retired  Tobacco Use   Smoking status: Never   Smokeless tobacco: Never  Vaping Use   Vaping Use: Never used  Substance and Sexual Activity   Alcohol use: No    Alcohol/week: 0.0 standard drinks   Drug use: No   Sexual activity: Not Currently  Other Topics Concern   Not on file  Social History Narrative   Right handed   Social Determinants of Health   Financial Resource Strain: High Risk   Difficulty of Paying Living Expenses: Hard  Food Insecurity: No Food Insecurity   Worried About Running Out of Food in the Last Year: Never true   Ran Out of Food in the Last Year: Never true  Transportation Needs: No Transportation Needs   Lack of Transportation (Medical): No   Lack of Transportation (Non-Medical): No  Physical Activity: Not on file  Stress: Not on file  Social Connections: Not on file    Review of Systems  Constitutional:  Negative for appetite change, fatigue and fever.  HENT:  Negative for congestion, ear pain, sinus pressure and sore throat.   Eyes:  Negative for pain.  Respiratory:  Positive for cough and shortness of breath. Negative for chest tightness and wheezing.   Cardiovascular:  Negative for chest pain and palpitations.  Gastrointestinal:  Negative for abdominal pain, constipation, diarrhea, nausea and vomiting.   Genitourinary:  Negative for dysuria and hematuria.  Musculoskeletal:  Negative for arthralgias, back pain, joint swelling and myalgias.  Skin:  Negative for rash.  Neurological:  Negative for dizziness, weakness and headaches.  Psychiatric/Behavioral:  Negative for dysphoric mood. The patient is not nervous/anxious.     Objective:  BP 128/80    Pulse 76    Temp 98.3 F (36.8 C) (Temporal)    Ht 4\' 11"  (1.499 m)    Wt 69 lb (31.3 kg)    SpO2 90%    BMI 13.94 kg/m   BP/Weight 11/17/2021 11/02/2021 [id number]  Systolic BP 0000000 0000000 [id number]  Diastolic BP 62 80 84  Wt. (Lbs) 74.6 69 71  BMI 15.07 13.94 14.34    Physical Exam Vitals reviewed.  Constitutional:      Appearance: Normal appearance.     Comments: frail  Neck:     Vascular: No carotid bruit.  Cardiovascular:     Rate and Rhythm: Normal rate and regular rhythm.     Heart sounds: Normal heart sounds.  Pulmonary:     Effort: Pulmonary effort is normal. No respiratory distress.     Breath sounds: Normal breath sounds.  Abdominal:     General: Abdomen is flat. Bowel sounds are normal.     Palpations: Abdomen is soft.     Tenderness: There is no abdominal tenderness.  Neurological:     Mental Status: She is alert and oriented to person, place, and time.  Psychiatric:        Mood and Affect: Mood normal.        Behavior: Behavior normal.    Diabetic Foot Exam - Simple   No data filed      Lab Results  Component Value Date   WBC 11.7 (H) 06/29/2021   HGB 11.3 06/29/2021   HCT 34.7 06/29/2021   PLT 302 06/29/2021   GLUCOSE 76 06/29/2021  CHOL 185 05/16/2021   TRIG 83 05/16/2021   HDL 71 05/16/2021   LDLCALC 99 05/16/2021   ALT 7 06/29/2021   AST 17 06/29/2021   NA 141 06/29/2021   K 4.4 06/29/2021   CL 98 06/29/2021   CREATININE 0.82 06/29/2021   BUN 26 06/29/2021   CO2 28 06/29/2021   TSH 1.970 08/29/2021   HGBA1C 5.5 03/08/2020      Assessment & Plan:   Problem List Items Addressed This Visit        Cardiovascular and Mediastinum   RESOLVED: Essential hypertension     Respiratory   Moderate persistent asthma without complication    The current medical regimen is effective;  continue present plan and medications.       Protracted bacterial bronchitis (HCC)    Rx: Bactrim DS I oral twice daily x 10 days.       Relevant Medications   sulfamethoxazole-trimethoprim (BACTRIM) 400-80 MG tablet     Digestive   Chronic idiopathic constipation     Endocrine   Hypothyroidism    Continue synthroid 75 mcg daily in am.        Nervous and Auditory   Benign essential tremor    Continue propranolol 20 mg twice daily         Musculoskeletal and Integument   Scoliosis    Chronic back pain.  Continue gabapetnin 300 mg one twice daily  Continue vicodin 5/325 mg one twice daily as needed severe pain.         Other   Hyperlipidemia - Primary    The current medical regimen is effective;  continue present plan and medications. Low fat diet.        Generalized abdominal pain    Start dicyclomine 20 mg before meals and before bed.      .  Meds ordered this encounter  Medications   sulfamethoxazole-trimethoprim (BACTRIM) 400-80 MG tablet    Sig: Take 2 tablets by mouth 2 (two) times daily.    Dispense:  40 tablet    Refill:  0   dicyclomine (BENTYL) 20 MG tablet    Sig: Take 1 tablet (20 mg total) by mouth 4 (four) times daily -  before meals and at bedtime.    Dispense:  120 tablet    Refill:  3    No orders of the defined types were placed in this encounter.    Follow-up: Return in about 4 weeks (around 11/30/2021) for chronic follow up.  An After Visit Summary was printed and given to the patient.   I,Lauren M Auman,acting as a scribe for Rochel Brome, MD.,have documented all relevant documentation on the behalf of Rochel Brome, MD,as directed by  Rochel Brome, MD while in the presence of Rochel Brome, MD.    Rochel Brome, MD [place] [phone]

## 2021-11-05 DIAGNOSIS — R531 Weakness: Secondary | ICD-10-CM | POA: Diagnosis not present

## 2021-11-05 DIAGNOSIS — G929 Unspecified toxic encephalopathy: Secondary | ICD-10-CM | POA: Diagnosis not present

## 2021-11-05 DIAGNOSIS — J441 Chronic obstructive pulmonary disease with (acute) exacerbation: Secondary | ICD-10-CM | POA: Diagnosis not present

## 2021-11-05 DIAGNOSIS — Z7982 Long term (current) use of aspirin: Secondary | ICD-10-CM | POA: Diagnosis not present

## 2021-11-05 DIAGNOSIS — Z7409 Other reduced mobility: Secondary | ICD-10-CM | POA: Diagnosis not present

## 2021-11-05 DIAGNOSIS — R262 Difficulty in walking, not elsewhere classified: Secondary | ICD-10-CM | POA: Diagnosis not present

## 2021-11-05 DIAGNOSIS — R0602 Shortness of breath: Secondary | ICD-10-CM | POA: Diagnosis not present

## 2021-11-05 DIAGNOSIS — Z66 Do not resuscitate: Secondary | ICD-10-CM | POA: Diagnosis not present

## 2021-11-05 DIAGNOSIS — Z9981 Dependence on supplemental oxygen: Secondary | ICD-10-CM | POA: Diagnosis not present

## 2021-11-05 DIAGNOSIS — Z681 Body mass index (BMI) 19 or less, adult: Secondary | ICD-10-CM | POA: Diagnosis not present

## 2021-11-05 DIAGNOSIS — Z4789 Encounter for other orthopedic aftercare: Secondary | ICD-10-CM | POA: Diagnosis not present

## 2021-11-05 DIAGNOSIS — R404 Transient alteration of awareness: Secondary | ICD-10-CM | POA: Diagnosis not present

## 2021-11-05 DIAGNOSIS — G250 Essential tremor: Secondary | ICD-10-CM | POA: Diagnosis not present

## 2021-11-05 DIAGNOSIS — Z20822 Contact with and (suspected) exposure to covid-19: Secondary | ICD-10-CM | POA: Diagnosis not present

## 2021-11-05 DIAGNOSIS — N189 Chronic kidney disease, unspecified: Secondary | ICD-10-CM | POA: Diagnosis not present

## 2021-11-05 DIAGNOSIS — G629 Polyneuropathy, unspecified: Secondary | ICD-10-CM | POA: Diagnosis not present

## 2021-11-05 DIAGNOSIS — M150 Primary generalized (osteo)arthritis: Secondary | ICD-10-CM | POA: Diagnosis not present

## 2021-11-05 DIAGNOSIS — R001 Bradycardia, unspecified: Secondary | ICD-10-CM | POA: Diagnosis not present

## 2021-11-05 DIAGNOSIS — G9340 Encephalopathy, unspecified: Secondary | ICD-10-CM | POA: Diagnosis not present

## 2021-11-05 DIAGNOSIS — J479 Bronchiectasis, uncomplicated: Secondary | ICD-10-CM | POA: Diagnosis not present

## 2021-11-05 DIAGNOSIS — I10 Essential (primary) hypertension: Secondary | ICD-10-CM | POA: Diagnosis not present

## 2021-11-05 DIAGNOSIS — E440 Moderate protein-calorie malnutrition: Secondary | ICD-10-CM | POA: Diagnosis not present

## 2021-11-05 DIAGNOSIS — R410 Disorientation, unspecified: Secondary | ICD-10-CM | POA: Diagnosis not present

## 2021-11-05 DIAGNOSIS — M47816 Spondylosis without myelopathy or radiculopathy, lumbar region: Secondary | ICD-10-CM | POA: Diagnosis not present

## 2021-11-05 DIAGNOSIS — J470 Bronchiectasis with acute lower respiratory infection: Secondary | ICD-10-CM | POA: Diagnosis not present

## 2021-11-05 DIAGNOSIS — R278 Other lack of coordination: Secondary | ICD-10-CM | POA: Diagnosis not present

## 2021-11-05 DIAGNOSIS — I272 Pulmonary hypertension, unspecified: Secondary | ICD-10-CM | POA: Diagnosis not present

## 2021-11-05 DIAGNOSIS — J9601 Acute respiratory failure with hypoxia: Secondary | ICD-10-CM | POA: Diagnosis not present

## 2021-11-05 DIAGNOSIS — M6259 Muscle wasting and atrophy, not elsewhere classified, multiple sites: Secondary | ICD-10-CM | POA: Diagnosis not present

## 2021-11-05 DIAGNOSIS — K219 Gastro-esophageal reflux disease without esophagitis: Secondary | ICD-10-CM | POA: Diagnosis not present

## 2021-11-05 DIAGNOSIS — S72142D Displaced intertrochanteric fracture of left femur, subsequent encounter for closed fracture with routine healing: Secondary | ICD-10-CM | POA: Diagnosis not present

## 2021-11-05 DIAGNOSIS — D649 Anemia, unspecified: Secondary | ICD-10-CM | POA: Diagnosis not present

## 2021-11-05 DIAGNOSIS — I129 Hypertensive chronic kidney disease with stage 1 through stage 4 chronic kidney disease, or unspecified chronic kidney disease: Secondary | ICD-10-CM | POA: Diagnosis not present

## 2021-11-05 DIAGNOSIS — Z96649 Presence of unspecified artificial hip joint: Secondary | ICD-10-CM | POA: Diagnosis not present

## 2021-11-05 DIAGNOSIS — Z743 Need for continuous supervision: Secondary | ICD-10-CM | POA: Diagnosis not present

## 2021-11-05 DIAGNOSIS — J449 Chronic obstructive pulmonary disease, unspecified: Secondary | ICD-10-CM | POA: Diagnosis not present

## 2021-11-05 DIAGNOSIS — Z79899 Other long term (current) drug therapy: Secondary | ICD-10-CM | POA: Diagnosis not present

## 2021-11-05 DIAGNOSIS — J99 Respiratory disorders in diseases classified elsewhere: Secondary | ICD-10-CM | POA: Diagnosis not present

## 2021-11-05 DIAGNOSIS — I471 Supraventricular tachycardia: Secondary | ICD-10-CM | POA: Diagnosis not present

## 2021-11-05 DIAGNOSIS — J471 Bronchiectasis with (acute) exacerbation: Secondary | ICD-10-CM | POA: Diagnosis not present

## 2021-11-05 DIAGNOSIS — I700 Atherosclerosis of aorta: Secondary | ICD-10-CM | POA: Diagnosis not present

## 2021-11-05 DIAGNOSIS — J45909 Unspecified asthma, uncomplicated: Secondary | ICD-10-CM | POA: Diagnosis not present

## 2021-11-05 DIAGNOSIS — R911 Solitary pulmonary nodule: Secondary | ICD-10-CM | POA: Diagnosis not present

## 2021-11-05 DIAGNOSIS — E46 Unspecified protein-calorie malnutrition: Secondary | ICD-10-CM | POA: Diagnosis not present

## 2021-11-05 DIAGNOSIS — E039 Hypothyroidism, unspecified: Secondary | ICD-10-CM | POA: Diagnosis not present

## 2021-11-05 DIAGNOSIS — G928 Other toxic encephalopathy: Secondary | ICD-10-CM | POA: Diagnosis not present

## 2021-11-10 DIAGNOSIS — M6259 Muscle wasting and atrophy, not elsewhere classified, multiple sites: Secondary | ICD-10-CM | POA: Diagnosis not present

## 2021-11-10 DIAGNOSIS — Z4789 Encounter for other orthopedic aftercare: Secondary | ICD-10-CM | POA: Diagnosis not present

## 2021-11-10 DIAGNOSIS — D649 Anemia, unspecified: Secondary | ICD-10-CM | POA: Diagnosis not present

## 2021-11-10 DIAGNOSIS — J4540 Moderate persistent asthma, uncomplicated: Secondary | ICD-10-CM | POA: Diagnosis not present

## 2021-11-10 DIAGNOSIS — J45909 Unspecified asthma, uncomplicated: Secondary | ICD-10-CM | POA: Diagnosis not present

## 2021-11-10 DIAGNOSIS — E039 Hypothyroidism, unspecified: Secondary | ICD-10-CM | POA: Diagnosis not present

## 2021-11-10 DIAGNOSIS — G894 Chronic pain syndrome: Secondary | ICD-10-CM | POA: Diagnosis not present

## 2021-11-10 DIAGNOSIS — G629 Polyneuropathy, unspecified: Secondary | ICD-10-CM | POA: Diagnosis not present

## 2021-11-10 DIAGNOSIS — R531 Weakness: Secondary | ICD-10-CM | POA: Diagnosis not present

## 2021-11-10 DIAGNOSIS — Z7409 Other reduced mobility: Secondary | ICD-10-CM | POA: Diagnosis not present

## 2021-11-10 DIAGNOSIS — I10 Essential (primary) hypertension: Secondary | ICD-10-CM | POA: Diagnosis not present

## 2021-11-10 DIAGNOSIS — J99 Respiratory disorders in diseases classified elsewhere: Secondary | ICD-10-CM | POA: Diagnosis not present

## 2021-11-10 DIAGNOSIS — E440 Moderate protein-calorie malnutrition: Secondary | ICD-10-CM | POA: Diagnosis not present

## 2021-11-10 DIAGNOSIS — I471 Supraventricular tachycardia: Secondary | ICD-10-CM | POA: Diagnosis not present

## 2021-11-10 DIAGNOSIS — J449 Chronic obstructive pulmonary disease, unspecified: Secondary | ICD-10-CM | POA: Diagnosis not present

## 2021-11-10 DIAGNOSIS — S72142D Displaced intertrochanteric fracture of left femur, subsequent encounter for closed fracture with routine healing: Secondary | ICD-10-CM | POA: Diagnosis not present

## 2021-11-10 DIAGNOSIS — M150 Primary generalized (osteo)arthritis: Secondary | ICD-10-CM | POA: Diagnosis not present

## 2021-11-10 DIAGNOSIS — R278 Other lack of coordination: Secondary | ICD-10-CM | POA: Diagnosis not present

## 2021-11-10 DIAGNOSIS — J9601 Acute respiratory failure with hypoxia: Secondary | ICD-10-CM | POA: Diagnosis not present

## 2021-11-10 DIAGNOSIS — K219 Gastro-esophageal reflux disease without esophagitis: Secondary | ICD-10-CM | POA: Diagnosis not present

## 2021-11-10 DIAGNOSIS — G929 Unspecified toxic encephalopathy: Secondary | ICD-10-CM | POA: Diagnosis not present

## 2021-11-10 DIAGNOSIS — J471 Bronchiectasis with (acute) exacerbation: Secondary | ICD-10-CM | POA: Diagnosis not present

## 2021-11-10 DIAGNOSIS — R262 Difficulty in walking, not elsewhere classified: Secondary | ICD-10-CM | POA: Diagnosis not present

## 2021-11-13 DIAGNOSIS — J449 Chronic obstructive pulmonary disease, unspecified: Secondary | ICD-10-CM | POA: Diagnosis not present

## 2021-11-13 DIAGNOSIS — D649 Anemia, unspecified: Secondary | ICD-10-CM | POA: Diagnosis not present

## 2021-11-13 DIAGNOSIS — I10 Essential (primary) hypertension: Secondary | ICD-10-CM | POA: Diagnosis not present

## 2021-11-13 DIAGNOSIS — E039 Hypothyroidism, unspecified: Secondary | ICD-10-CM | POA: Diagnosis not present

## 2021-11-13 DIAGNOSIS — E440 Moderate protein-calorie malnutrition: Secondary | ICD-10-CM | POA: Diagnosis not present

## 2021-11-15 ENCOUNTER — Ambulatory Visit: Payer: Medicare Other | Admitting: Cardiology

## 2021-11-17 ENCOUNTER — Encounter: Payer: Self-pay | Admitting: Primary Care

## 2021-11-17 ENCOUNTER — Ambulatory Visit: Payer: Medicare Other | Admitting: Primary Care

## 2021-11-17 ENCOUNTER — Other Ambulatory Visit: Payer: Self-pay

## 2021-11-17 ENCOUNTER — Ambulatory Visit: Payer: Medicare Other | Admitting: Emergency Medicine

## 2021-11-17 VITALS — BP 128/62 | HR 62 | Temp 98.5°F | Ht 59.0 in | Wt 74.6 lb

## 2021-11-17 DIAGNOSIS — J4540 Moderate persistent asthma, uncomplicated: Secondary | ICD-10-CM | POA: Diagnosis not present

## 2021-11-17 DIAGNOSIS — J449 Chronic obstructive pulmonary disease, unspecified: Secondary | ICD-10-CM | POA: Diagnosis not present

## 2021-11-17 DIAGNOSIS — I10 Essential (primary) hypertension: Secondary | ICD-10-CM | POA: Diagnosis not present

## 2021-11-17 DIAGNOSIS — E440 Moderate protein-calorie malnutrition: Secondary | ICD-10-CM | POA: Diagnosis not present

## 2021-11-17 DIAGNOSIS — J471 Bronchiectasis with (acute) exacerbation: Secondary | ICD-10-CM | POA: Diagnosis not present

## 2021-11-17 MED ORDER — GUAIFENESIN ER 600 MG PO TB12: 600.0000 mg | ORAL_TABLET | Freq: Two times a day (BID) | ORAL | 2 refills | Status: DC

## 2021-11-17 NOTE — Progress Notes (Addendum)
@Patient  ID: Stephanie Clay, female    DOB: [birth year], 88 y.o.   MRN: [id number]  Chief Complaint  Patient presents with   Follow-up    Follow up from Dr Lamonte Sakai. Pt just got out of the hospital last Friday. Pt went in for brinchiectasis.     Referring provider: Rochel Brome, MD  HPI: 88 year old female, never smoked. PMH significant for HTN, aortic valve insufficiency, tricuspid valve regurgitation, allergic rhinitis, bronchiectasis, moderate persistent asthma, hypothyroidism, severe protein-calorie malnutrition. Patient of Dr. Lamonte Sakai, last seen on 05/22/21.   Previous [facility] pulmonary encounter: HPI 88 year old never smoker whom I have seen before in the past for bronchiectasis and mucus impaction.  She has a fairly longstanding history of nausea, GERD, abdominal discomfort associated with anorexia and weight loss.  Also lower extremity DVT, allergic rhinitis, suspected intermittent aspiration.  She has never had positive AFB respiratory culture data to my knowledge.   Currently managed on Breztri, just started this last week, unsure whether she has benefited. Formerly she was on Trelegy, but it irritated her mouth.  She is unsure whether she wants to continue it.  She reports that she does cough but it is typically dry. Every few days the cough is persistent, lasts all day. She does not have an albuterol HFA right now.  On fluticasone nasal spray, Protonix twice daily.    CT scan of the chest 02/16/2021 done at [facility] reviewed by me, shows scattered areas of dilated airways, bronchiectasis with mucus impaction, scattered groundglass and micronodular disease affecting all lobes, more so in the right upper, right middle, left upper.   ROV 05/22/21 --88 year old never smoker with a history of bronchiectasis and mucus impaction on CT chest as recently as 02/16/2021, scattered groundglass suspicious for atypical mycobacterial infection (never proven) also with associated severe obstructive  lung disease, on Breztri since the beginning of May 2022.  At her last visit I added 3% saline nebs to see if this would help with mucus clearance, possibly even allow AFB culture.  She was never able to get it due to [employer] television/film/video. Today she reports that she is using Breztri, albuterol nebs bid Since last time she fell and fx her L hip, had to have sgy, has been moving slower and has not felt well since then. She was treated for thrush - still has dryness. Minimal cough, not every day. Poor mucous clearance. She was not   Hospital admission #Bronchiectasis exacerbation 2/2 CAP, POA #Acute respiratory failure with hypoxia  - Notably it has been previously documented that the patient has COPD, however she has not had obstructive changes on her prior PFT's in 2022, no history of smoking, and no family history of COPD/liver disease, patient and family deny any history of asthma. Patient presented to the ED with 1 day history of worsening confusion, fatigue, and shortness of breath found to have a new oxygen requirement up to 5L, she is not on oxygen at home. Notably, she had been seen by a pulmonologist as outpatient at [facility] health day with Bactrim for presumed pneumonia. CT chest without contrast demonstrated stable appearing areas of bronchiectasis with mild nodularity likely secondary to chronic indolent infection such as MIA, however these are stable from prior CT scan excellent health. It did demonstrate some new nodular changes in the left upper lobe thought to be postinflammatory. She was treated symptomatically and was started on ceftriaxone and azithromycin initially for CAP coverage and prednisone 40 mg daily for total 5-day  course which was completed by time of discharge. However, patient was transition to cefepime as recommended for bronchiectasis exacerbation, she was de-escalated on fourth day of antibiotics to azithromycin 500 mg daily and cefdinir 300 mg daily for total 9 day course of  antibiotics with end date of 1/25 per pulmonology recommendations. Was gradually weaned on O2 over the course of admission and was on room air by fourth day of admission.  Of note initial blood cultures did have patient grow 1/2 with staph hominis, repeat blood cultures on 1/16 which show no growth. This was likely contamination    #Acute toxic metabolic encephalopathy versus delirium -Family noted that patient had worsening altered mental status over the course of the day prior to presentation to the ED. On presentation patient was oriented only to self, responding only intermittently to questions. No focal neurologic deficits appreciated on exam, CT head without contrast did not demonstrate any acute processes. TSH, folate, B12 WNL. Notably patient is on sedating medications at home Xanax and gabapentin, doses were decreased, and patient was started on thiamine. On third day of antibiotic treatment patient's mental status improved, she was alert and oriented to person place but not situation. Patient's family members reported that she seemed to be at her baseline. Was discharged on Xanax 0.25 mg from prior 0.5 mg daily. Continued Norco as needed twice daily at time of discharge.   11/17/2021- Interim hx  Patient presents today for 6 month follow-up. Patient has been following with Dr. Lamonte Sakai for management of her bronchiectasis with evidence of chronic atypical infection. Maintained on [employer]. We have tried getting patient on hypertonic saline nebs to help her produce sputum to check AFB sputum culture but were unable d.t national shortage. FOB has been deferred. She was admitted on 11/05/21 -11/10/[facility] for acute bronchiectasis exacerbation, acute respiratory failure and alt mental status. CT chest without contrast demonstrated stable appearing areas of bronchiectasis with mild nodularity likely secondary to chronic indolent infection such as MIA, however these are stable from prior  CT scan. It did demonstrate some new nodular changes in the left upper lobe thought to be postinflammatory. She was treated for CAP coverage with ceftriaxone and azithromycin. She was transitioned to cefepime as recommended for bronchiectasis exacerbation, she was de-escalated on fourth day of antibiotics to azithromycin 500 mg daily and cefdinir 300 mg daily for total 9 day course of antibiotics with end date of 1/25 per pulmonology recommendations.   Cough is better per patient. It is not significantly productive. She has more congestion "rattling" at night. She had a low grade temp last night of 99 degree's which resolved. She is not currently taking mucinex or using flutter valve. Her daughters states that they do have hypertonic saline nebulizer's at home. She is eating alright and has gained between 2-4 lbs. Currently at nursing facility with hopes to be discharged next week.    Allergies  Allergen Reactions   Lisinopril Other (See Comments)   Remeron [Mirtazapine] Other (See Comments)   Ciprofloxacin     unknown   Levofloxacin     unknonw   Nitrofurantoin     unknown   Omnicef [Cefdinir] Diarrhea   Sertraline Hcl Other (See Comments)    Tremors    Immunization History  Administered Date(s) Administered   Fluad Quad(high Dose 65+) 06/29/2021   Influenza, High Dose Seasonal PF 06/28/2017   Influenza,inj,Quad PF,6+ Mos 06/30/2015, 08/20/2016, 08/11/2020   Influenza-Unspecified 07/22/2014, 07/23/2019   Pneumococcal Conjugate-13 06/20/2015  Pneumococcal Polysaccharide-23 07/23/2007   Tdap 02/20/2018    Past Medical History:  Diagnosis Date   Abdominal pain 01/27/2018   AKI (acute kidney injury) ([place]) 06/30/2016   Allergic rhinitis 08/20/2016   Anemia    Aneurysm of other specified arteries ([place])    Anxiety    Arthritis    Asthma    Bronchiectasis with evidence of chronic atypical infection 01/03/2015   CT chest 12/2014:  Bronchiectasis throughout right lung, left clear.  +tree  and bud, + patchy gg infiltrates in RLL Sputum 2016: no growth, AFB smear neg PFT's 02/2015:  Normal except mild decrease in DLCO   Bronchiectasis without acute exacerbation ([place]) 01/03/2015   CT chest 12/2014:  Bronchiectasis throughout right lung, left clear.  +tree and bud, + patchy gg infiltrates in RLL Sputum 2016: no growth, AFB smear neg PFT's 02/2015:  Normal except mild decrease in DLCO    Cataract    bilateral eye surgery   Chronic bilateral low back pain without sciatica 11/29/2019   Chronic chest pain    Chronic idiopathic constipation    Chronic kidney disease, stage 3 (HCC)    Chronic venous hypertension (idiopathic) with other complications of bilateral lower extremity    Decreased pedal pulses 03/24/2020   Depression    Dysphagia 03/01/2015   Essential hypertension 07/01/2016   Gastro-esophageal reflux    Heart murmur    Hyperglycemia 07/01/2016   Hyperlipemia    Hyperlipidemia 07/01/2016   Hypertension    Hypokalemia 07/01/2016   Hypomagnesemia 07/01/2016   Hypothyroidism 07/01/2016   Idiopathic progressive neuropathy    Iron (Fe) deficiency anemia    Joint pain 03/24/2020   Localized edema 11/26/2019   Mild vitamin D deficiency 11/29/2019   Moderate protein-calorie malnutrition (HCC)    Normocytic anemia 01/07/2020   Other fatigue 11/26/2019   Other idiopathic scoliosis, lumbar region    Pneumathemia ([place])    Primary generalized (osteo)arthritis    Primary insomnia    Radiculopathy, lumbar region    Scoliosis    Secondary hyperparathyroidism ([place])    Severe protein-calorie malnutrition ([place]) 03/24/2020   Underweight 07/02/2016   Unilateral primary osteoarthritis, left knee    Unilateral primary osteoarthritis, right knee     Tobacco History: Social History   Tobacco Use  Smoking Status Never  Smokeless Tobacco Never   Counseling given: Not Answered   Outpatient Medications Prior to Visit  Medication Sig Dispense Refill   acetaminophen (TYLENOL) 325 MG tablet Take 650  mg by mouth every 6 (six) hours as needed for mild pain.     albuterol (VENTOLIN HFA) 108 (90 Base) MCG/ACT inhaler Inhale 2 puffs into the lungs every 6 (six) hours as needed for wheezing or shortness of breath. 8 g 6   ALPRAZolam (XANAX) 0.5 MG tablet Take 1 tablet (0.5 mg total) by mouth at bedtime. 30 tablet 2   aspirin 325 MG tablet Take 325 mg by mouth 3 (three) times daily before meals.     Budeson-Glycopyrrol-Formoterol (BREZTRI AEROSPHERE) 160-9-4.8 MCG/ACT AERO Inhale 2 puffs into the lungs 2 (two) times daily. 10.7 g 3   Cholecalciferol (VITAMIN D3 PO) Take 2,000 Units by mouth daily.     Cyanocobalamin 2500 MCG TABS Take 2,500 mcg by mouth daily.     diclofenac (FLECTOR) 1.3 % PTCH Place 1 patch onto the skin 2 (two) times daily. 60 patch 2   dicyclomine (BENTYL) 20 MG tablet Take 1 tablet (20 mg total) by mouth 4 (four) times daily -  before meals and at bedtime. 120 tablet 3   diltiazem (CARDIZEM) 30 MG tablet TAKE 1 TABLET BY MOUTH EVERY 8 HOURS 270 tablet 0   fluticasone (FLONASE) 50 MCG/ACT nasal spray Place 2 sprays into both nostrils as needed for allergies or rhinitis.     gabapentin (NEURONTIN) 300 MG capsule TAKE 1 CAPSULE BY MOUTH 2 TIMES DAILY 180 capsule 0   HYDROcodone-acetaminophen (NORCO/VICODIN) 5-325 MG tablet Take 1 tablet by mouth 2 (two) times daily as needed for severe pain (OSteoarthritis of lumbar spine.). 60 tablet 0   ketoconazole (NIZORAL) 2 % cream Twice daily to chin and corners of her mouth 15 g 0   levothyroxine (SYNTHROID) 75 MCG tablet Take 1 tablet (75 mcg total) by mouth daily. 90 tablet 3   nitroGLYCERIN (NITROSTAT) 0.4 MG SL tablet Place 0.4 mg under the tongue every 5 (five) minutes as needed for chest pain. X 3 doses     nystatin (MYCOSTATIN) 100000 UNIT/ML suspension Take 5 mLs (500,000 Units total) by mouth 3 (three) times daily. 60 mL 3   ondansetron (ZOFRAN-ODT) 4 MG disintegrating tablet Take 1 tablet (4 mg total) by mouth every 8 (eight) hours  as needed. 40 tablet 2   pantoprazole (PROTONIX) 40 MG tablet TAKE 1 TABLET BY MOUTH TWICE DAILY 180 tablet 0   polyethylene glycol (MIRALAX / GLYCOLAX) 17 g packet Take 17 g by mouth 2 (two) times daily.     potassium chloride SA (KLOR-CON) 20 MEQ tablet TAKE 3 TABLETS BY MOUTH ONCE DAILY. 90 tablet 2   propranolol (INDERAL) 20 MG tablet TAKE 1 TABLET BY MOUTH 2 TIMES DAILY. 180 tablet 0   sennosides-docusate sodium (SENOKOT-S) 8.6-50 MG tablet Take 1 tablet by mouth 2 (two) times daily.     simvastatin (ZOCOR) 10 MG tablet TAKE 1 TABLET BY MOUTH DAILY FOR CHOLESTEROL 90 tablet 0   sulfamethoxazole-trimethoprim (BACTRIM) 400-80 MG tablet Take 2 tablets by mouth 2 (two) times daily. 40 tablet 0   vortioxetine HBr (TRINTELLIX) 10 MG TABS tablet Take 1 tablet (10 mg total) by mouth daily. 30 tablet 2   No facility-administered medications prior to visit.    Review of Systems  Review of Systems  Constitutional: Negative.   HENT:  Positive for congestion.   Respiratory:  Positive for cough. Negative for chest tightness, shortness of breath and wheezing.   Cardiovascular: Negative.     Physical Exam  BP 128/62 (BP Location: Left Arm, Patient Position: Sitting, Cuff Size: Normal)    Pulse 62    Temp 98.5 F (36.9 C) (Oral)    Ht 4\' 11"  (1.499 m)    Wt 74 lb 9.6 oz (33.8 kg)    SpO2 96%    BMI 15.07 kg/m  Physical Exam Constitutional:      General: She is not in acute distress.    Appearance: Normal appearance. She is not ill-appearing.     Comments: Under weight  HENT:     Head: Normocephalic and atraumatic.     Mouth/Throat:     Mouth: Mucous membranes are moist.     Pharynx: Oropharynx is clear.  Cardiovascular:     Rate and Rhythm: Normal rate and regular rhythm.  Pulmonary:     Effort: Pulmonary effort is normal.     Breath sounds: Normal breath sounds.     Comments: Scattered rales Musculoskeletal:        General: Normal range of motion.  Skin:    General: Skin is warm  and  dry.  Neurological:     General: No focal deficit present.     Mental Status: She is alert and oriented to person, place, and time. Mental status is at baseline.  Psychiatric:        Mood and Affect: Mood normal.        Behavior: Behavior normal.        Thought Content: Thought content normal.        Judgment: Judgment normal.     Lab Results:  CBC    Component Value Date/Time   WBC 11.7 (H) 06/29/2021 1456   WBC 13.2 (H) 07/07/2016 1249   RBC 3.82 06/29/2021 1456   RBC 3.75 (L) 07/07/2016 1249   HGB 11.3 06/29/2021 1456   HCT 34.7 06/29/2021 1456   PLT 302 06/29/2021 1456   MCV 91 06/29/2021 1456   MCH 29.6 06/29/2021 1456   MCH 29.1 07/07/2016 1249   MCHC 32.6 06/29/2021 1456   MCHC 31.1 07/07/2016 1249   RDW 12.2 06/29/2021 1456   LYMPHSABS 2.2 06/29/2021 1456   MONOABS 1.3 (H) 07/07/2016 1249   EOSABS 0.4 06/29/2021 1456   BASOSABS 0.1 06/29/2021 1456    BMET    Component Value Date/Time   NA 141 06/29/2021 1456   K 4.4 06/29/2021 1456   CL 98 06/29/2021 1456   CO2 28 06/29/2021 1456   GLUCOSE 76 06/29/2021 1456   GLUCOSE 90 04/07/2018 1005   BUN 26 06/29/2021 1456   CREATININE 0.82 06/29/2021 1456   CALCIUM 9.9 06/29/2021 1456   GFRNONAA 46 (L) 10/03/2020 1616   GFRAA 53 (L) 10/03/2020 1616    BNP    Component Value Date/Time   BNP 183.3 (H) 07/07/2016 1430    ProBNP    Component Value Date/Time   PROBNP 602 03/19/2017 1520    Imaging: No results found.   Assessment & Plan:   Bronchiectasis ([place])  - hx bronchiectasis and mucus impaction on CT chest with scattered ground glass suspicious for atypical mycobacterial infection which has not been proven. Hospitalized for acute exacerbation in January 2023, discharged on cefdinir for total 9 days. Cough has improved. She has increased congestion at night.  She is not good candidate for FOB. No recent sputum cultures, recommend checking AFB sputum sample if able. Encourage consistent pulmonary  clearance with mucinex twice daily, flutter valve TID and hypertonic saline twice daily. FU in 6-8 weeks with Dr. Lamonte Sakai to follow-up on pulmonary clearance/sputum sample. If continues to have flare ups may need to discuss cyclic or daily abx.    Moderate persistent asthma without complication - Stable; No symptoms of acute exacerbation or wheezing on exam  - Continue Breztri aeropshere two puffs twice daily   Addendum: Per PCP 11/17/2021 sputum testing for AFB was normal, scanned   40 mins spent on case: > 50% face to face with patient   Martyn Ehrich, NP 11/17/2021

## 2021-11-17 NOTE — Assessment & Plan Note (Addendum)
-   hx bronchiectasis and mucus impaction on CT chest with scattered ground glass suspicious for atypical mycobacterial infection which has not been proven. Hospitalized for acute exacerbation in January 2023, discharged on cefdinir for total 9 days. Cough has improved. She has increased congestion at night.  She is not good candidate for FOB. No recent sputum cultures, recommend checking AFB sputum sample if able. Encourage consistent pulmonary clearance with mucinex twice daily, flutter valve TID and hypertonic saline twice daily. FU in 6-8 weeks with Dr. Delton Coombes to follow-up on pulmonary clearance/sputum sample. If continues to have flare ups may need to discuss cyclic or daily abx.

## 2021-11-17 NOTE — Assessment & Plan Note (Signed)
-   Stable; No symptoms of acute exacerbation or wheezing on exam  - Continue Breztri aeropshere two puffs twice daily

## 2021-11-17 NOTE — Patient Instructions (Addendum)
Recommendations: - Continue Breztri two puffs morning and evening (rinse mouth after use) - Please start taking Mucinex 600 morning and evening every day (increased 1,200mg  twice daily for active periods of congestion) - Use flutter valve three times a day  - She should continue to use hypertonic saline nebulizer please use twice a day to help loose congestion/cough  - If able to cough up mucus we would like to obtain a sputum sample   Orders: - Flutter valve  - Sputum sample for AFB   Rx: - Mucinex 600mg  twice daily (RX printed)  Follow-up: - 6-8 weeks with Dr. or sooner if needed

## 2021-11-20 DIAGNOSIS — G894 Chronic pain syndrome: Secondary | ICD-10-CM | POA: Diagnosis not present

## 2021-11-20 DIAGNOSIS — I10 Essential (primary) hypertension: Secondary | ICD-10-CM | POA: Diagnosis not present

## 2021-11-20 DIAGNOSIS — E039 Hypothyroidism, unspecified: Secondary | ICD-10-CM | POA: Diagnosis not present

## 2021-11-20 DIAGNOSIS — J449 Chronic obstructive pulmonary disease, unspecified: Secondary | ICD-10-CM | POA: Diagnosis not present

## 2021-11-22 DIAGNOSIS — J99 Respiratory disorders in diseases classified elsewhere: Secondary | ICD-10-CM | POA: Diagnosis not present

## 2021-11-22 DIAGNOSIS — E039 Hypothyroidism, unspecified: Secondary | ICD-10-CM | POA: Diagnosis not present

## 2021-11-22 DIAGNOSIS — D649 Anemia, unspecified: Secondary | ICD-10-CM | POA: Diagnosis not present

## 2021-11-22 DIAGNOSIS — M150 Primary generalized (osteo)arthritis: Secondary | ICD-10-CM | POA: Diagnosis not present

## 2021-11-22 DIAGNOSIS — G894 Chronic pain syndrome: Secondary | ICD-10-CM | POA: Diagnosis not present

## 2021-11-22 DIAGNOSIS — S72142D Displaced intertrochanteric fracture of left femur, subsequent encounter for closed fracture with routine healing: Secondary | ICD-10-CM | POA: Diagnosis not present

## 2021-11-22 DIAGNOSIS — G629 Polyneuropathy, unspecified: Secondary | ICD-10-CM | POA: Diagnosis not present

## 2021-11-22 DIAGNOSIS — R262 Difficulty in walking, not elsewhere classified: Secondary | ICD-10-CM | POA: Diagnosis not present

## 2021-11-22 DIAGNOSIS — J9601 Acute respiratory failure with hypoxia: Secondary | ICD-10-CM | POA: Diagnosis not present

## 2021-11-22 DIAGNOSIS — J45909 Unspecified asthma, uncomplicated: Secondary | ICD-10-CM | POA: Diagnosis not present

## 2021-11-22 DIAGNOSIS — Z4789 Encounter for other orthopedic aftercare: Secondary | ICD-10-CM | POA: Diagnosis not present

## 2021-11-22 DIAGNOSIS — M6259 Muscle wasting and atrophy, not elsewhere classified, multiple sites: Secondary | ICD-10-CM | POA: Diagnosis not present

## 2021-11-22 DIAGNOSIS — R278 Other lack of coordination: Secondary | ICD-10-CM | POA: Diagnosis not present

## 2021-11-22 DIAGNOSIS — I10 Essential (primary) hypertension: Secondary | ICD-10-CM | POA: Diagnosis not present

## 2021-11-22 DIAGNOSIS — E440 Moderate protein-calorie malnutrition: Secondary | ICD-10-CM | POA: Diagnosis not present

## 2021-11-22 DIAGNOSIS — J449 Chronic obstructive pulmonary disease, unspecified: Secondary | ICD-10-CM | POA: Diagnosis not present

## 2021-11-22 DIAGNOSIS — K219 Gastro-esophageal reflux disease without esophagitis: Secondary | ICD-10-CM | POA: Diagnosis not present

## 2021-11-22 DIAGNOSIS — I471 Supraventricular tachycardia: Secondary | ICD-10-CM | POA: Diagnosis not present

## 2021-11-22 DIAGNOSIS — J471 Bronchiectasis with (acute) exacerbation: Secondary | ICD-10-CM | POA: Diagnosis not present

## 2021-11-22 HISTORY — PX: HEMIARTHROPLASTY HIP: SUR652

## 2021-11-26 ENCOUNTER — Encounter: Payer: Self-pay | Admitting: Family Medicine

## 2021-11-26 DIAGNOSIS — B9689 Other specified bacterial agents as the cause of diseases classified elsewhere: Secondary | ICD-10-CM | POA: Insufficient documentation

## 2021-11-26 HISTORY — DX: Other specified bacterial agents as the cause of diseases classified elsewhere: B96.89

## 2021-11-26 NOTE — Assessment & Plan Note (Signed)
The current medical regimen is effective;  continue present plan and medications.  

## 2021-11-26 NOTE — Assessment & Plan Note (Signed)
Continue synthroid 75 mcg daily in am.

## 2021-11-26 NOTE — Assessment & Plan Note (Signed)
Continue propranolol.20 mg twice daily.  ?

## 2021-11-26 NOTE — Assessment & Plan Note (Signed)
The current medical regimen is effective;  continue present plan and medications. Low fat diet.

## 2021-11-26 NOTE — Assessment & Plan Note (Signed)
Start dicyclomine 20 mg before meals and before bed.

## 2021-11-26 NOTE — Assessment & Plan Note (Signed)
Rx: Bactrim DS I oral twice daily x 10 days.

## 2021-11-26 NOTE — Assessment & Plan Note (Signed)
Chronic back pain.  Continue gabapetnin 300 mg one twice daily  Continue vicodin 5/325 mg one twice daily as needed severe pain.

## 2021-11-27 DIAGNOSIS — R2689 Other abnormalities of gait and mobility: Secondary | ICD-10-CM | POA: Diagnosis not present

## 2021-11-27 DIAGNOSIS — Z743 Need for continuous supervision: Secondary | ICD-10-CM | POA: Diagnosis not present

## 2021-11-27 DIAGNOSIS — S72011A Unspecified intracapsular fracture of right femur, initial encounter for closed fracture: Secondary | ICD-10-CM | POA: Diagnosis not present

## 2021-11-27 DIAGNOSIS — Z79899 Other long term (current) drug therapy: Secondary | ICD-10-CM | POA: Diagnosis not present

## 2021-11-27 DIAGNOSIS — J449 Chronic obstructive pulmonary disease, unspecified: Secondary | ICD-10-CM | POA: Diagnosis not present

## 2021-11-27 DIAGNOSIS — S72009A Fracture of unspecified part of neck of unspecified femur, initial encounter for closed fracture: Secondary | ICD-10-CM | POA: Diagnosis not present

## 2021-11-27 DIAGNOSIS — Z888 Allergy status to other drugs, medicaments and biological substances status: Secondary | ICD-10-CM | POA: Diagnosis not present

## 2021-11-27 DIAGNOSIS — D649 Anemia, unspecified: Secondary | ICD-10-CM | POA: Diagnosis not present

## 2021-11-27 DIAGNOSIS — J479 Bronchiectasis, uncomplicated: Secondary | ICD-10-CM | POA: Diagnosis not present

## 2021-11-27 DIAGNOSIS — I129 Hypertensive chronic kidney disease with stage 1 through stage 4 chronic kidney disease, or unspecified chronic kidney disease: Secondary | ICD-10-CM | POA: Diagnosis not present

## 2021-11-27 DIAGNOSIS — J99 Respiratory disorders in diseases classified elsewhere: Secondary | ICD-10-CM | POA: Diagnosis not present

## 2021-11-27 DIAGNOSIS — R0689 Other abnormalities of breathing: Secondary | ICD-10-CM | POA: Diagnosis not present

## 2021-11-27 DIAGNOSIS — G629 Polyneuropathy, unspecified: Secondary | ICD-10-CM | POA: Diagnosis not present

## 2021-11-27 DIAGNOSIS — Z9181 History of falling: Secondary | ICD-10-CM | POA: Diagnosis not present

## 2021-11-27 DIAGNOSIS — E43 Unspecified severe protein-calorie malnutrition: Secondary | ICD-10-CM | POA: Diagnosis not present

## 2021-11-27 DIAGNOSIS — K5904 Chronic idiopathic constipation: Secondary | ICD-10-CM | POA: Diagnosis not present

## 2021-11-27 DIAGNOSIS — E785 Hyperlipidemia, unspecified: Secondary | ICD-10-CM | POA: Diagnosis not present

## 2021-11-27 DIAGNOSIS — J9601 Acute respiratory failure with hypoxia: Secondary | ICD-10-CM | POA: Diagnosis not present

## 2021-11-27 DIAGNOSIS — N183 Chronic kidney disease, stage 3 unspecified: Secondary | ICD-10-CM | POA: Diagnosis not present

## 2021-11-27 DIAGNOSIS — Z96641 Presence of right artificial hip joint: Secondary | ICD-10-CM | POA: Diagnosis not present

## 2021-11-27 DIAGNOSIS — M6259 Muscle wasting and atrophy, not elsewhere classified, multiple sites: Secondary | ICD-10-CM | POA: Diagnosis not present

## 2021-11-27 DIAGNOSIS — E039 Hypothyroidism, unspecified: Secondary | ICD-10-CM | POA: Diagnosis not present

## 2021-11-27 DIAGNOSIS — M1990 Unspecified osteoarthritis, unspecified site: Secondary | ICD-10-CM | POA: Diagnosis not present

## 2021-11-27 DIAGNOSIS — M545 Low back pain, unspecified: Secondary | ICD-10-CM | POA: Diagnosis not present

## 2021-11-27 DIAGNOSIS — K219 Gastro-esophageal reflux disease without esophagitis: Secondary | ICD-10-CM | POA: Diagnosis not present

## 2021-11-27 DIAGNOSIS — Z7982 Long term (current) use of aspirin: Secondary | ICD-10-CM | POA: Diagnosis not present

## 2021-11-27 DIAGNOSIS — R079 Chest pain, unspecified: Secondary | ICD-10-CM | POA: Diagnosis not present

## 2021-11-27 DIAGNOSIS — Z881 Allergy status to other antibiotic agents status: Secondary | ICD-10-CM | POA: Diagnosis not present

## 2021-11-27 DIAGNOSIS — M150 Primary generalized (osteo)arthritis: Secondary | ICD-10-CM | POA: Diagnosis not present

## 2021-11-27 DIAGNOSIS — M1611 Unilateral primary osteoarthritis, right hip: Secondary | ICD-10-CM | POA: Diagnosis not present

## 2021-11-27 DIAGNOSIS — M25511 Pain in right shoulder: Secondary | ICD-10-CM | POA: Diagnosis not present

## 2021-11-27 DIAGNOSIS — Z7401 Bed confinement status: Secondary | ICD-10-CM | POA: Diagnosis not present

## 2021-11-27 DIAGNOSIS — D62 Acute posthemorrhagic anemia: Secondary | ICD-10-CM | POA: Diagnosis not present

## 2021-11-27 DIAGNOSIS — R339 Retention of urine, unspecified: Secondary | ICD-10-CM | POA: Diagnosis not present

## 2021-11-27 DIAGNOSIS — M25551 Pain in right hip: Secondary | ICD-10-CM | POA: Diagnosis not present

## 2021-11-27 DIAGNOSIS — M84651A Pathological fracture in other disease, right femur, initial encounter for fracture: Secondary | ICD-10-CM | POA: Diagnosis not present

## 2021-11-27 DIAGNOSIS — I10 Essential (primary) hypertension: Secondary | ICD-10-CM | POA: Diagnosis not present

## 2021-11-27 DIAGNOSIS — S72491A Other fracture of lower end of right femur, initial encounter for closed fracture: Secondary | ICD-10-CM | POA: Diagnosis not present

## 2021-11-27 DIAGNOSIS — W19XXXA Unspecified fall, initial encounter: Secondary | ICD-10-CM | POA: Diagnosis not present

## 2021-11-27 DIAGNOSIS — R278 Other lack of coordination: Secondary | ICD-10-CM | POA: Diagnosis not present

## 2021-11-27 DIAGNOSIS — S72001A Fracture of unspecified part of neck of right femur, initial encounter for closed fracture: Secondary | ICD-10-CM | POA: Diagnosis not present

## 2021-11-27 DIAGNOSIS — J45909 Unspecified asthma, uncomplicated: Secondary | ICD-10-CM | POA: Diagnosis not present

## 2021-11-27 DIAGNOSIS — Z681 Body mass index (BMI) 19 or less, adult: Secondary | ICD-10-CM | POA: Diagnosis not present

## 2021-11-27 DIAGNOSIS — Z86718 Personal history of other venous thrombosis and embolism: Secondary | ICD-10-CM | POA: Diagnosis not present

## 2021-11-27 DIAGNOSIS — Z471 Aftercare following joint replacement surgery: Secondary | ICD-10-CM | POA: Diagnosis not present

## 2021-11-27 DIAGNOSIS — S72001D Fracture of unspecified part of neck of right femur, subsequent encounter for closed fracture with routine healing: Secondary | ICD-10-CM | POA: Diagnosis not present

## 2021-11-30 DIAGNOSIS — M25511 Pain in right shoulder: Secondary | ICD-10-CM | POA: Diagnosis not present

## 2021-11-30 DIAGNOSIS — G894 Chronic pain syndrome: Secondary | ICD-10-CM | POA: Diagnosis not present

## 2021-11-30 DIAGNOSIS — Z978 Presence of other specified devices: Secondary | ICD-10-CM | POA: Diagnosis not present

## 2021-11-30 DIAGNOSIS — K5900 Constipation, unspecified: Secondary | ICD-10-CM | POA: Diagnosis not present

## 2021-11-30 DIAGNOSIS — E785 Hyperlipidemia, unspecified: Secondary | ICD-10-CM | POA: Diagnosis not present

## 2021-11-30 DIAGNOSIS — R109 Unspecified abdominal pain: Secondary | ICD-10-CM | POA: Diagnosis not present

## 2021-11-30 DIAGNOSIS — G629 Polyneuropathy, unspecified: Secondary | ICD-10-CM | POA: Diagnosis not present

## 2021-11-30 DIAGNOSIS — R278 Other lack of coordination: Secondary | ICD-10-CM | POA: Diagnosis not present

## 2021-11-30 DIAGNOSIS — M7989 Other specified soft tissue disorders: Secondary | ICD-10-CM | POA: Diagnosis not present

## 2021-11-30 DIAGNOSIS — S72001D Fracture of unspecified part of neck of right femur, subsequent encounter for closed fracture with routine healing: Secondary | ICD-10-CM | POA: Diagnosis not present

## 2021-11-30 DIAGNOSIS — Z9181 History of falling: Secondary | ICD-10-CM | POA: Diagnosis not present

## 2021-11-30 DIAGNOSIS — K5904 Chronic idiopathic constipation: Secondary | ICD-10-CM | POA: Diagnosis not present

## 2021-11-30 DIAGNOSIS — S72009A Fracture of unspecified part of neck of unspecified femur, initial encounter for closed fracture: Secondary | ICD-10-CM | POA: Diagnosis not present

## 2021-11-30 DIAGNOSIS — M6259 Muscle wasting and atrophy, not elsewhere classified, multiple sites: Secondary | ICD-10-CM | POA: Diagnosis not present

## 2021-11-30 DIAGNOSIS — J449 Chronic obstructive pulmonary disease, unspecified: Secondary | ICD-10-CM | POA: Diagnosis not present

## 2021-11-30 DIAGNOSIS — R2689 Other abnormalities of gait and mobility: Secondary | ICD-10-CM | POA: Diagnosis not present

## 2021-11-30 DIAGNOSIS — R1084 Generalized abdominal pain: Secondary | ICD-10-CM | POA: Diagnosis not present

## 2021-11-30 DIAGNOSIS — Z681 Body mass index (BMI) 19 or less, adult: Secondary | ICD-10-CM | POA: Diagnosis not present

## 2021-11-30 DIAGNOSIS — Z7401 Bed confinement status: Secondary | ICD-10-CM | POA: Diagnosis not present

## 2021-11-30 DIAGNOSIS — Z79899 Other long term (current) drug therapy: Secondary | ICD-10-CM | POA: Diagnosis not present

## 2021-11-30 DIAGNOSIS — R601 Generalized edema: Secondary | ICD-10-CM | POA: Diagnosis not present

## 2021-11-30 DIAGNOSIS — M150 Primary generalized (osteo)arthritis: Secondary | ICD-10-CM | POA: Diagnosis not present

## 2021-11-30 DIAGNOSIS — I10 Essential (primary) hypertension: Secondary | ICD-10-CM | POA: Diagnosis not present

## 2021-11-30 DIAGNOSIS — R339 Retention of urine, unspecified: Secondary | ICD-10-CM | POA: Diagnosis not present

## 2021-11-30 DIAGNOSIS — E43 Unspecified severe protein-calorie malnutrition: Secondary | ICD-10-CM | POA: Diagnosis not present

## 2021-11-30 DIAGNOSIS — Z743 Need for continuous supervision: Secondary | ICD-10-CM | POA: Diagnosis not present

## 2021-11-30 DIAGNOSIS — J479 Bronchiectasis, uncomplicated: Secondary | ICD-10-CM | POA: Diagnosis not present

## 2021-11-30 DIAGNOSIS — R609 Edema, unspecified: Secondary | ICD-10-CM | POA: Diagnosis not present

## 2021-11-30 DIAGNOSIS — E039 Hypothyroidism, unspecified: Secondary | ICD-10-CM | POA: Diagnosis not present

## 2021-11-30 DIAGNOSIS — D649 Anemia, unspecified: Secondary | ICD-10-CM | POA: Diagnosis not present

## 2021-11-30 DIAGNOSIS — S72001A Fracture of unspecified part of neck of right femur, initial encounter for closed fracture: Secondary | ICD-10-CM | POA: Diagnosis not present

## 2021-11-30 DIAGNOSIS — J9601 Acute respiratory failure with hypoxia: Secondary | ICD-10-CM | POA: Diagnosis not present

## 2021-11-30 DIAGNOSIS — J99 Respiratory disorders in diseases classified elsewhere: Secondary | ICD-10-CM | POA: Diagnosis not present

## 2021-11-30 DIAGNOSIS — K219 Gastro-esophageal reflux disease without esophagitis: Secondary | ICD-10-CM | POA: Diagnosis not present

## 2021-11-30 DIAGNOSIS — R140 Abdominal distension (gaseous): Secondary | ICD-10-CM | POA: Diagnosis not present

## 2021-12-04 DIAGNOSIS — J449 Chronic obstructive pulmonary disease, unspecified: Secondary | ICD-10-CM | POA: Diagnosis not present

## 2021-12-04 DIAGNOSIS — E039 Hypothyroidism, unspecified: Secondary | ICD-10-CM | POA: Diagnosis not present

## 2021-12-04 DIAGNOSIS — R339 Retention of urine, unspecified: Secondary | ICD-10-CM | POA: Diagnosis not present

## 2021-12-04 DIAGNOSIS — G894 Chronic pain syndrome: Secondary | ICD-10-CM | POA: Diagnosis not present

## 2021-12-06 ENCOUNTER — Telehealth: Payer: Medicare Other

## 2021-12-06 DIAGNOSIS — S72001D Fracture of unspecified part of neck of right femur, subsequent encounter for closed fracture with routine healing: Secondary | ICD-10-CM | POA: Diagnosis not present

## 2021-12-06 DIAGNOSIS — R609 Edema, unspecified: Secondary | ICD-10-CM | POA: Diagnosis not present

## 2021-12-13 ENCOUNTER — Ambulatory Visit: Payer: Medicare Other | Admitting: Family Medicine

## 2021-12-13 DIAGNOSIS — Z79899 Other long term (current) drug therapy: Secondary | ICD-10-CM | POA: Diagnosis not present

## 2021-12-13 DIAGNOSIS — Z978 Presence of other specified devices: Secondary | ICD-10-CM | POA: Diagnosis not present

## 2021-12-13 DIAGNOSIS — R339 Retention of urine, unspecified: Secondary | ICD-10-CM | POA: Diagnosis not present

## 2021-12-18 DIAGNOSIS — R609 Edema, unspecified: Secondary | ICD-10-CM | POA: Diagnosis not present

## 2021-12-18 DIAGNOSIS — J449 Chronic obstructive pulmonary disease, unspecified: Secondary | ICD-10-CM | POA: Diagnosis not present

## 2021-12-18 DIAGNOSIS — R1084 Generalized abdominal pain: Secondary | ICD-10-CM | POA: Diagnosis not present

## 2021-12-18 DIAGNOSIS — S72001D Fracture of unspecified part of neck of right femur, subsequent encounter for closed fracture with routine healing: Secondary | ICD-10-CM | POA: Diagnosis not present

## 2021-12-19 DIAGNOSIS — K5900 Constipation, unspecified: Secondary | ICD-10-CM | POA: Diagnosis not present

## 2021-12-19 DIAGNOSIS — R339 Retention of urine, unspecified: Secondary | ICD-10-CM | POA: Diagnosis not present

## 2021-12-19 DIAGNOSIS — J449 Chronic obstructive pulmonary disease, unspecified: Secondary | ICD-10-CM | POA: Diagnosis not present

## 2021-12-21 DIAGNOSIS — Z978 Presence of other specified devices: Secondary | ICD-10-CM | POA: Diagnosis not present

## 2021-12-21 DIAGNOSIS — R339 Retention of urine, unspecified: Secondary | ICD-10-CM | POA: Diagnosis not present

## 2021-12-27 DIAGNOSIS — J449 Chronic obstructive pulmonary disease, unspecified: Secondary | ICD-10-CM | POA: Diagnosis not present

## 2021-12-27 DIAGNOSIS — R339 Retention of urine, unspecified: Secondary | ICD-10-CM | POA: Diagnosis not present

## 2021-12-27 DIAGNOSIS — K5900 Constipation, unspecified: Secondary | ICD-10-CM | POA: Diagnosis not present

## 2021-12-28 DIAGNOSIS — R339 Retention of urine, unspecified: Secondary | ICD-10-CM | POA: Diagnosis not present

## 2021-12-29 ENCOUNTER — Ambulatory Visit: Payer: Medicare Other | Admitting: Emergency Medicine

## 2022-01-01 DIAGNOSIS — S72001D Fracture of unspecified part of neck of right femur, subsequent encounter for closed fracture with routine healing: Secondary | ICD-10-CM | POA: Diagnosis not present

## 2022-01-01 DIAGNOSIS — I129 Hypertensive chronic kidney disease with stage 1 through stage 4 chronic kidney disease, or unspecified chronic kidney disease: Secondary | ICD-10-CM | POA: Diagnosis not present

## 2022-01-01 DIAGNOSIS — D631 Anemia in chronic kidney disease: Secondary | ICD-10-CM | POA: Diagnosis not present

## 2022-01-01 DIAGNOSIS — G894 Chronic pain syndrome: Secondary | ICD-10-CM | POA: Diagnosis not present

## 2022-01-01 DIAGNOSIS — Z79899 Other long term (current) drug therapy: Secondary | ICD-10-CM | POA: Diagnosis not present

## 2022-01-01 DIAGNOSIS — Z7951 Long term (current) use of inhaled steroids: Secondary | ICD-10-CM | POA: Diagnosis not present

## 2022-01-01 DIAGNOSIS — Z9181 History of falling: Secondary | ICD-10-CM | POA: Diagnosis not present

## 2022-01-01 DIAGNOSIS — M419 Scoliosis, unspecified: Secondary | ICD-10-CM | POA: Diagnosis not present

## 2022-01-01 DIAGNOSIS — J479 Bronchiectasis, uncomplicated: Secondary | ICD-10-CM | POA: Diagnosis not present

## 2022-01-01 DIAGNOSIS — E43 Unspecified severe protein-calorie malnutrition: Secondary | ICD-10-CM | POA: Diagnosis not present

## 2022-01-01 DIAGNOSIS — N183 Chronic kidney disease, stage 3 unspecified: Secondary | ICD-10-CM | POA: Diagnosis not present

## 2022-01-01 DIAGNOSIS — Z7982 Long term (current) use of aspirin: Secondary | ICD-10-CM | POA: Diagnosis not present

## 2022-01-01 DIAGNOSIS — Z86718 Personal history of other venous thrombosis and embolism: Secondary | ICD-10-CM | POA: Diagnosis not present

## 2022-01-01 DIAGNOSIS — E039 Hypothyroidism, unspecified: Secondary | ICD-10-CM | POA: Diagnosis not present

## 2022-01-01 DIAGNOSIS — E785 Hyperlipidemia, unspecified: Secondary | ICD-10-CM | POA: Diagnosis not present

## 2022-01-02 ENCOUNTER — Telehealth: Payer: Self-pay

## 2022-01-02 ENCOUNTER — Other Ambulatory Visit: Payer: Self-pay | Admitting: Family Medicine

## 2022-01-02 DIAGNOSIS — Z7951 Long term (current) use of inhaled steroids: Secondary | ICD-10-CM | POA: Diagnosis not present

## 2022-01-02 DIAGNOSIS — Z9181 History of falling: Secondary | ICD-10-CM | POA: Diagnosis not present

## 2022-01-02 DIAGNOSIS — E785 Hyperlipidemia, unspecified: Secondary | ICD-10-CM | POA: Diagnosis not present

## 2022-01-02 DIAGNOSIS — Z79899 Other long term (current) drug therapy: Secondary | ICD-10-CM | POA: Diagnosis not present

## 2022-01-02 DIAGNOSIS — Z7982 Long term (current) use of aspirin: Secondary | ICD-10-CM | POA: Diagnosis not present

## 2022-01-02 DIAGNOSIS — Z86718 Personal history of other venous thrombosis and embolism: Secondary | ICD-10-CM | POA: Diagnosis not present

## 2022-01-02 DIAGNOSIS — I129 Hypertensive chronic kidney disease with stage 1 through stage 4 chronic kidney disease, or unspecified chronic kidney disease: Secondary | ICD-10-CM | POA: Diagnosis not present

## 2022-01-02 DIAGNOSIS — D631 Anemia in chronic kidney disease: Secondary | ICD-10-CM | POA: Diagnosis not present

## 2022-01-02 DIAGNOSIS — G894 Chronic pain syndrome: Secondary | ICD-10-CM | POA: Diagnosis not present

## 2022-01-02 DIAGNOSIS — E43 Unspecified severe protein-calorie malnutrition: Secondary | ICD-10-CM | POA: Diagnosis not present

## 2022-01-02 DIAGNOSIS — E039 Hypothyroidism, unspecified: Secondary | ICD-10-CM | POA: Diagnosis not present

## 2022-01-02 DIAGNOSIS — N183 Chronic kidney disease, stage 3 unspecified: Secondary | ICD-10-CM | POA: Diagnosis not present

## 2022-01-02 DIAGNOSIS — S72001D Fracture of unspecified part of neck of right femur, subsequent encounter for closed fracture with routine healing: Secondary | ICD-10-CM | POA: Diagnosis not present

## 2022-01-02 DIAGNOSIS — J479 Bronchiectasis, uncomplicated: Secondary | ICD-10-CM | POA: Diagnosis not present

## 2022-01-02 DIAGNOSIS — M419 Scoliosis, unspecified: Secondary | ICD-10-CM | POA: Diagnosis not present

## 2022-01-02 MED ORDER — MEGESTROL ACETATE 400 MG/10ML PO SUSP: 400.0000 mg | Freq: Every day | ORAL | 2 refills | Status: DC

## 2022-01-02 NOTE — Telephone Encounter (Signed)
[name] calling requesting refill of megestrol acetate 40 mg for patient. This was given to patient at rehab. ? ?She takes this daily at 10 mL a day.  ? ?[name] 01/02/22 10:24 AM ? ?

## 2022-01-02 NOTE — Telephone Encounter (Signed)
Stephanie Clay, [place] gave verbal permission for Dr Rochel Brome to complete DNR and MOST forms for patient.  ? ?Stephanie Clay 01/02/22 3:06 PM ? ?

## 2022-01-02 NOTE — Telephone Encounter (Signed)
Left detailed VM that this has been sent.  ? ?[name] 01/02/22 1:46 PM ? ?

## 2022-01-03 ENCOUNTER — Telehealth: Payer: Self-pay

## 2022-01-03 DIAGNOSIS — Z79899 Other long term (current) drug therapy: Secondary | ICD-10-CM | POA: Diagnosis not present

## 2022-01-03 DIAGNOSIS — S72001D Fracture of unspecified part of neck of right femur, subsequent encounter for closed fracture with routine healing: Secondary | ICD-10-CM | POA: Diagnosis not present

## 2022-01-03 DIAGNOSIS — Z86718 Personal history of other venous thrombosis and embolism: Secondary | ICD-10-CM | POA: Diagnosis not present

## 2022-01-03 DIAGNOSIS — E039 Hypothyroidism, unspecified: Secondary | ICD-10-CM | POA: Diagnosis not present

## 2022-01-03 DIAGNOSIS — I129 Hypertensive chronic kidney disease with stage 1 through stage 4 chronic kidney disease, or unspecified chronic kidney disease: Secondary | ICD-10-CM | POA: Diagnosis not present

## 2022-01-03 DIAGNOSIS — J479 Bronchiectasis, uncomplicated: Secondary | ICD-10-CM | POA: Diagnosis not present

## 2022-01-03 DIAGNOSIS — M419 Scoliosis, unspecified: Secondary | ICD-10-CM | POA: Diagnosis not present

## 2022-01-03 DIAGNOSIS — E43 Unspecified severe protein-calorie malnutrition: Secondary | ICD-10-CM | POA: Diagnosis not present

## 2022-01-03 DIAGNOSIS — G894 Chronic pain syndrome: Secondary | ICD-10-CM | POA: Diagnosis not present

## 2022-01-03 DIAGNOSIS — Z7951 Long term (current) use of inhaled steroids: Secondary | ICD-10-CM | POA: Diagnosis not present

## 2022-01-03 DIAGNOSIS — E785 Hyperlipidemia, unspecified: Secondary | ICD-10-CM | POA: Diagnosis not present

## 2022-01-03 DIAGNOSIS — Z7982 Long term (current) use of aspirin: Secondary | ICD-10-CM | POA: Diagnosis not present

## 2022-01-03 DIAGNOSIS — Z9181 History of falling: Secondary | ICD-10-CM | POA: Diagnosis not present

## 2022-01-03 DIAGNOSIS — N183 Chronic kidney disease, stage 3 unspecified: Secondary | ICD-10-CM | POA: Diagnosis not present

## 2022-01-03 DIAGNOSIS — D631 Anemia in chronic kidney disease: Secondary | ICD-10-CM | POA: Diagnosis not present

## 2022-01-03 NOTE — Telephone Encounter (Signed)
Santina Evans , RN from Home health called stating that nursing will be seeing Stephanie Clay once a week for 4 weeks. Also is getting speech therapy evaluation because patient is having some terrible and wheezing. Santina Evans also started that [facility] had a fall last night and is saying right side is sore/hurting. If have any questions please call [phone]. ?

## 2022-01-03 NOTE — Telephone Encounter (Signed)
Physical therapy w/ [facility] calling requesting verbal orders. Requests schedule once a week for eight weeks.  ?Verbal orders for this schedule given.  ? ?Physical therapy also reported fall. This was reported in previous telephone encounter. No new information.  ? ?Harrell Lark 01/03/22 4:04 PM ? ?

## 2022-01-03 NOTE — Telephone Encounter (Addendum)
[name] calling as they were just informed by Home Health agency that a hospital bed will be covered by patient's insurance. They would like an order for this if possible.  ? ?[name] 01/03/22 2:27 PM ? ?

## 2022-01-04 ENCOUNTER — Telehealth: Payer: Self-pay

## 2022-01-04 NOTE — Telephone Encounter (Signed)
Made multiple calls regarding patient Stephanie Clay today. ? ?Spoke with home home health nurse, Barnetta Chapel.  She has been out to see patient and confirmed to me that patient is nonambulatory at this time.  She is alert and oriented to Name and DOB.  She denies any open wounds.  She is worried about patient aspirating and sent referral for speech therapy eval.  She reports abnormal breath sounds and wheezing. ? ?Spoke with patient's daughter, Jackelyn Poling.  She was with the patient at the time of my call.  She confirmed the family does want a DNR order and we discussed the MOST form at her request.  Form was filled out per our discussion. [name] confirmed that her sister, Lanetta Inch, is the healthcare POA.  We also discussed the need for a hospital bed.  She confirmed that [name] is dependent for transfers and positioning.  Virtual TOC appointment made with Dr Tobie Poet as they are unable to transport [name]. ? ?Spoke with patient's daughter, Fransico Meadow.  She confirmed that she is the POA.  We discussed the DNR and MOST forms including the boxes checked on the MOST form.  She is going to bring in the POA form for our records tomorrow.  Once we have that document she will confirm the DNR and MOST form. ?

## 2022-01-05 DIAGNOSIS — Z7951 Long term (current) use of inhaled steroids: Secondary | ICD-10-CM | POA: Diagnosis not present

## 2022-01-05 DIAGNOSIS — D631 Anemia in chronic kidney disease: Secondary | ICD-10-CM | POA: Diagnosis not present

## 2022-01-05 DIAGNOSIS — I129 Hypertensive chronic kidney disease with stage 1 through stage 4 chronic kidney disease, or unspecified chronic kidney disease: Secondary | ICD-10-CM | POA: Diagnosis not present

## 2022-01-05 DIAGNOSIS — E43 Unspecified severe protein-calorie malnutrition: Secondary | ICD-10-CM | POA: Diagnosis not present

## 2022-01-05 DIAGNOSIS — J479 Bronchiectasis, uncomplicated: Secondary | ICD-10-CM | POA: Diagnosis not present

## 2022-01-05 DIAGNOSIS — G894 Chronic pain syndrome: Secondary | ICD-10-CM | POA: Diagnosis not present

## 2022-01-05 DIAGNOSIS — Z9181 History of falling: Secondary | ICD-10-CM | POA: Diagnosis not present

## 2022-01-05 DIAGNOSIS — E785 Hyperlipidemia, unspecified: Secondary | ICD-10-CM | POA: Diagnosis not present

## 2022-01-05 DIAGNOSIS — M419 Scoliosis, unspecified: Secondary | ICD-10-CM | POA: Diagnosis not present

## 2022-01-05 DIAGNOSIS — S72001D Fracture of unspecified part of neck of right femur, subsequent encounter for closed fracture with routine healing: Secondary | ICD-10-CM | POA: Diagnosis not present

## 2022-01-05 DIAGNOSIS — Z86718 Personal history of other venous thrombosis and embolism: Secondary | ICD-10-CM | POA: Diagnosis not present

## 2022-01-05 DIAGNOSIS — N183 Chronic kidney disease, stage 3 unspecified: Secondary | ICD-10-CM | POA: Diagnosis not present

## 2022-01-05 DIAGNOSIS — Z79899 Other long term (current) drug therapy: Secondary | ICD-10-CM | POA: Diagnosis not present

## 2022-01-05 DIAGNOSIS — Z7982 Long term (current) use of aspirin: Secondary | ICD-10-CM | POA: Diagnosis not present

## 2022-01-05 DIAGNOSIS — E039 Hypothyroidism, unspecified: Secondary | ICD-10-CM | POA: Diagnosis not present

## 2022-01-09 DIAGNOSIS — E43 Unspecified severe protein-calorie malnutrition: Secondary | ICD-10-CM | POA: Diagnosis not present

## 2022-01-09 DIAGNOSIS — I129 Hypertensive chronic kidney disease with stage 1 through stage 4 chronic kidney disease, or unspecified chronic kidney disease: Secondary | ICD-10-CM | POA: Diagnosis not present

## 2022-01-09 DIAGNOSIS — G894 Chronic pain syndrome: Secondary | ICD-10-CM | POA: Diagnosis not present

## 2022-01-09 DIAGNOSIS — Z7982 Long term (current) use of aspirin: Secondary | ICD-10-CM | POA: Diagnosis not present

## 2022-01-09 DIAGNOSIS — J479 Bronchiectasis, uncomplicated: Secondary | ICD-10-CM | POA: Diagnosis not present

## 2022-01-09 DIAGNOSIS — D631 Anemia in chronic kidney disease: Secondary | ICD-10-CM | POA: Diagnosis not present

## 2022-01-09 DIAGNOSIS — Z79899 Other long term (current) drug therapy: Secondary | ICD-10-CM | POA: Diagnosis not present

## 2022-01-09 DIAGNOSIS — S72001D Fracture of unspecified part of neck of right femur, subsequent encounter for closed fracture with routine healing: Secondary | ICD-10-CM | POA: Diagnosis not present

## 2022-01-09 DIAGNOSIS — Z9181 History of falling: Secondary | ICD-10-CM | POA: Diagnosis not present

## 2022-01-09 DIAGNOSIS — Z86718 Personal history of other venous thrombosis and embolism: Secondary | ICD-10-CM | POA: Diagnosis not present

## 2022-01-09 DIAGNOSIS — F419 Anxiety disorder, unspecified: Secondary | ICD-10-CM

## 2022-01-09 DIAGNOSIS — E785 Hyperlipidemia, unspecified: Secondary | ICD-10-CM | POA: Diagnosis not present

## 2022-01-09 DIAGNOSIS — Z7951 Long term (current) use of inhaled steroids: Secondary | ICD-10-CM | POA: Diagnosis not present

## 2022-01-09 DIAGNOSIS — F339 Major depressive disorder, recurrent, unspecified: Secondary | ICD-10-CM

## 2022-01-09 DIAGNOSIS — N183 Chronic kidney disease, stage 3 unspecified: Secondary | ICD-10-CM | POA: Diagnosis not present

## 2022-01-09 DIAGNOSIS — E039 Hypothyroidism, unspecified: Secondary | ICD-10-CM | POA: Diagnosis not present

## 2022-01-09 DIAGNOSIS — M419 Scoliosis, unspecified: Secondary | ICD-10-CM | POA: Diagnosis not present

## 2022-01-10 ENCOUNTER — Telehealth: Payer: Self-pay

## 2022-01-10 DIAGNOSIS — E43 Unspecified severe protein-calorie malnutrition: Secondary | ICD-10-CM | POA: Diagnosis not present

## 2022-01-10 DIAGNOSIS — J479 Bronchiectasis, uncomplicated: Secondary | ICD-10-CM | POA: Diagnosis not present

## 2022-01-10 DIAGNOSIS — Z86718 Personal history of other venous thrombosis and embolism: Secondary | ICD-10-CM | POA: Diagnosis not present

## 2022-01-10 DIAGNOSIS — G894 Chronic pain syndrome: Secondary | ICD-10-CM | POA: Diagnosis not present

## 2022-01-10 DIAGNOSIS — Z9181 History of falling: Secondary | ICD-10-CM | POA: Diagnosis not present

## 2022-01-10 DIAGNOSIS — D631 Anemia in chronic kidney disease: Secondary | ICD-10-CM | POA: Diagnosis not present

## 2022-01-10 DIAGNOSIS — E039 Hypothyroidism, unspecified: Secondary | ICD-10-CM | POA: Diagnosis not present

## 2022-01-10 DIAGNOSIS — S72001D Fracture of unspecified part of neck of right femur, subsequent encounter for closed fracture with routine healing: Secondary | ICD-10-CM | POA: Diagnosis not present

## 2022-01-10 DIAGNOSIS — Z7982 Long term (current) use of aspirin: Secondary | ICD-10-CM | POA: Diagnosis not present

## 2022-01-10 DIAGNOSIS — Z7951 Long term (current) use of inhaled steroids: Secondary | ICD-10-CM | POA: Diagnosis not present

## 2022-01-10 DIAGNOSIS — N183 Chronic kidney disease, stage 3 unspecified: Secondary | ICD-10-CM | POA: Diagnosis not present

## 2022-01-10 DIAGNOSIS — E785 Hyperlipidemia, unspecified: Secondary | ICD-10-CM | POA: Diagnosis not present

## 2022-01-10 DIAGNOSIS — M419 Scoliosis, unspecified: Secondary | ICD-10-CM | POA: Diagnosis not present

## 2022-01-10 DIAGNOSIS — Z79899 Other long term (current) drug therapy: Secondary | ICD-10-CM | POA: Diagnosis not present

## 2022-01-10 DIAGNOSIS — I129 Hypertensive chronic kidney disease with stage 1 through stage 4 chronic kidney disease, or unspecified chronic kidney disease: Secondary | ICD-10-CM | POA: Diagnosis not present

## 2022-01-10 NOTE — Telephone Encounter (Signed)
Stephanie Clay calling to report patient status while in home with patient. Patient's lungs do sound better than when admitted to home health, however she is more ShOB and appetite has decreased. Respirations are 21 per minute. Patient did have pneumonia when in facility. Has continued the megace.  ? ?Spoke with Dr in office, he recommended patient and family proceed to ED. Recommended to Stephanie Clay, family believes this is not neccessary and she is stable. Nurse also believes she is stable at this time.  ? ?Stephanie Clay requested verbal orders to extend nurse visits. Verbal orders given for this.  ? ?[name] 01/10/22 2:28 PM ? ?

## 2022-01-10 NOTE — Telephone Encounter (Signed)
Marzetta Board, [place] called requesting patient to see a provider due to symptoms. Made Stacy aware of previous conversation and recommendation given to [place]. She VU and requested appointment.  ? ?Appointment scheduled for 3/23.  ? ?Harrell Lark 01/10/22 3:17 PM ? ?

## 2022-01-10 NOTE — Telephone Encounter (Signed)
Stephanie Clay, speech therapy w/ [facility] requesting verbal orders. Would like to work with patient on chewing once a week for four weeks.  ? ?Verbal orders given for this schedule.  ? ?[name] 01/10/22 4:33 PM ? ?

## 2022-01-11 ENCOUNTER — Encounter: Payer: Self-pay | Admitting: Physician Assistant

## 2022-01-11 ENCOUNTER — Ambulatory Visit (INDEPENDENT_AMBULATORY_CARE_PROVIDER_SITE_OTHER): Payer: Medicare Other | Admitting: Physician Assistant

## 2022-01-11 VITALS — BP 118/78 | HR 60 | Temp 99.5°F | Ht 59.0 in | Wt 74.0 lb

## 2022-01-11 DIAGNOSIS — J471 Bronchiectasis with (acute) exacerbation: Secondary | ICD-10-CM | POA: Diagnosis not present

## 2022-01-11 MED ORDER — SULFAMETHOXAZOLE-TRIMETHOPRIM 800-160 MG PO TABS: 1.0000 | ORAL_TABLET | Freq: Two times a day (BID) | ORAL | 0 refills | Status: DC

## 2022-01-11 MED ORDER — PREDNISONE 20 MG PO TABS: ORAL_TABLET | ORAL | 0 refills | Status: AC

## 2022-01-11 MED ORDER — CEFTRIAXONE SODIUM 1 G IJ SOLR
1.0000 g | Freq: Once | INTRAMUSCULAR | Status: AC
  Administered 2022-01-11: 1 g via INTRAMUSCULAR

## 2022-01-11 NOTE — Progress Notes (Signed)
? ?Acute Office Visit ? ?Subjective:  ? ? Patient ID: Stephanie Clay, female    DOB: 10/18/1932, 88 y.o.   MRN: [id number] ? ?Chief Complaint  ?Patient presents with  ? Shortness of breath  ? ? ?HPI: ?Patient is in today for complaints of cough, congestion and shortness of breath.  Daughter states that since about Monday she has noted that patient seems to have more chest congestion than usual.  She started a low grade fever today.  Patient has not been able to cough up any sputum.  She states she is not using her saline nebulizer, not using albuterol inhaler regularly or using mucinex. ?Pt had history of pneumonia several weeks ago and actually was discharged 2 weeks ago from rehab facility. ? ?Past Medical History:  ?Diagnosis Date  ? Abdominal pain 01/27/2018  ? AKI (acute kidney injury) ([place]) 06/30/2016  ? Allergic rhinitis 08/20/2016  ? Anemia   ? Aneurysm of other specified arteries ([place])   ? Anxiety   ? Arthritis   ? Asthma   ? Bronchiectasis with evidence of chronic atypical infection 01/03/2015  ? CT chest 12/2014:  Bronchiectasis throughout right lung, left clear.  +tree and bud, + patchy gg infiltrates in RLL Sputum 2016: no growth, AFB smear neg PFT's 02/2015:  Normal except mild decrease in DLCO  ? Bronchiectasis without acute exacerbation ([place]) 01/03/2015  ? CT chest 12/2014:  Bronchiectasis throughout right lung, left clear.  +tree and bud, + patchy gg infiltrates in RLL Sputum 2016: no growth, AFB smear neg PFT's 02/2015:  Normal except mild decrease in DLCO   ? Cataract   ? bilateral eye surgery  ? Chronic bilateral low back pain without sciatica 11/29/2019  ? Chronic chest pain   ? Chronic idiopathic constipation   ? Chronic kidney disease, stage 3 (HCC)   ? Chronic venous hypertension (idiopathic) with other complications of bilateral lower extremity   ? Decreased pedal pulses 03/24/2020  ? Depression   ? Dysphagia 03/01/2015  ? Essential hypertension 07/01/2016  ? Gastro-esophageal reflux   ? Heart  murmur   ? Hyperglycemia 07/01/2016  ? Hyperlipemia   ? Hyperlipidemia 07/01/2016  ? Hypertension   ? Hypokalemia 07/01/2016  ? Hypomagnesemia 07/01/2016  ? Hypothyroidism 07/01/2016  ? Idiopathic progressive neuropathy   ? Iron (Fe) deficiency anemia   ? Joint pain 03/24/2020  ? Localized edema 11/26/2019  ? Mild vitamin D deficiency 11/29/2019  ? Moderate protein-calorie malnutrition ([place])   ? Normocytic anemia 01/07/2020  ? Other fatigue 11/26/2019  ? Other idiopathic scoliosis, lumbar region   ? Pneumathemia ([place])   ? Primary generalized (osteo)arthritis   ? Primary insomnia   ? Radiculopathy, lumbar region   ? Scoliosis   ? Secondary hyperparathyroidism ([place])   ? Severe protein-calorie malnutrition ([place]) 03/24/2020  ? Underweight 07/02/2016  ? Unilateral primary osteoarthritis, left knee   ? Unilateral primary osteoarthritis, right knee   ? ? ?Past Surgical History:  ?Procedure Laterality Date  ? ABDOMINAL HYSTERECTOMY    ? CYSTOCELE REPAIR    ? SALPINGOOPHORECTOMY    ? ? ?Family History  ?Problem Relation Age of Onset  ? Emphysema Father   ? Heart disease Mother   ? Cancer Sister   ?     metastatic lung cancer  ? Heart disease Sister   ? Hypertension Sister   ? Varicose Veins Sister   ? Hyperlipidemia Daughter   ? Hypertension Daughter   ? Diabetes Daughter   ? Hyperlipidemia  Daughter   ? Hypertension Daughter   ? Thyroid disease Daughter   ? Thyroid disease Daughter   ? Colon cancer Neg Hx   ? Esophageal cancer Neg Hx   ? Stomach cancer Neg Hx   ? ? ?Social History  ? ?Socioeconomic History  ? Marital status: Widowed  ?  Spouse name: carl  ? Number of children: 3  ? Years of education: 75  ? Highest education level: Not on file  ?Occupational History  ? Occupation: retired  ?Tobacco Use  ? Smoking status: Never  ? Smokeless tobacco: Never  ?Vaping Use  ? Vaping Use: Never used  ?Substance and Sexual Activity  ? Alcohol use: No  ?  Alcohol/week: 0.0 standard drinks  ? Drug use: No  ? Sexual activity: Not Currently  ?Other  Topics Concern  ? Not on file  ?Social History Narrative  ? Right handed  ? ?Social Determinants of Health  ? ?Financial Resource Strain: High Risk  ? Difficulty of Paying Living Expenses: Hard  ?Food Insecurity: No Food Insecurity  ? Worried About Charity fundraiser in the Last Year: Never true  ? Ran Out of Food in the Last Year: Never true  ?Transportation Needs: No Transportation Needs  ? Lack of Transportation (Medical): No  ? Lack of Transportation (Non-Medical): No  ?Physical Activity: Not on file  ?Stress: Not on file  ?Social Connections: Not on file  ?Intimate Partner Violence: Not on file  ? ? ?Outpatient Medications Prior to Visit  ?Medication Sig Dispense Refill  ? acetaminophen (TYLENOL) 325 MG tablet Take 650 mg by mouth every 6 (six) hours as needed for mild pain.    ? albuterol (PROVENTIL) (2.5 MG/3ML) 0.083% nebulizer solution Take by nebulization.    ? albuterol (VENTOLIN HFA) 108 (90 Base) MCG/ACT inhaler Inhale 2 puffs into the lungs every 6 (six) hours as needed for wheezing or shortness of breath. 8 g 6  ? ALPRAZolam (XANAX) 0.5 MG tablet Take 1 tablet (0.5 mg total) by mouth at bedtime. 30 tablet 2  ? ascorbic acid (VITAMIN C) 500 MG tablet Take by mouth.    ? aspirin 325 MG tablet Take 325 mg by mouth 3 (three) times daily before meals.    ? bethanechol (URECHOLINE) 50 MG tablet Take 50 mg by mouth 2 (two) times daily.    ? Budeson-Glycopyrrol-Formoterol (BREZTRI AEROSPHERE) 160-9-4.8 MCG/ACT AERO Inhale 2 puffs into the lungs 2 (two) times daily. 10.7 g 3  ? Cholecalciferol (VITAMIN D3 PO) Take 2,000 Units by mouth daily.    ? Cholecalciferol (VITAMIN D3) 125 MCG (5000 UT) CAPS Take 1 capsule by mouth daily.    ? cyanocobalamin 1000 MCG tablet Take by mouth.    ? Cyanocobalamin 2500 MCG TABS Take 2,500 mcg by mouth daily.    ? diclofenac (FLECTOR) 1.3 % PTCH Place 1 patch onto the skin 2 (two) times daily. 60 patch 2  ? diclofenac Sodium (VOLTAREN) 1 % GEL Apply 1 application. topically 4  (four) times daily.    ? dicyclomine (BENTYL) 20 MG tablet Take 1 tablet (20 mg total) by mouth 4 (four) times daily -  before meals and at bedtime. 120 tablet 3  ? diltiazem (CARDIZEM) 30 MG tablet TAKE 1 TABLET BY MOUTH EVERY 8 HOURS 270 tablet 0  ? fluticasone (FLONASE) 50 MCG/ACT nasal spray Place 2 sprays into both nostrils as needed for allergies or rhinitis.    ? gabapentin (NEURONTIN) 300 MG capsule TAKE 1 CAPSULE  BY MOUTH 2 TIMES DAILY 180 capsule 0  ? HYDROcodone-acetaminophen (NORCO/VICODIN) 5-325 MG tablet Take 1 tablet by mouth 2 (two) times daily as needed for severe pain (OSteoarthritis of lumbar spine.). 60 tablet 0  ? K-PHOS 500 MG tablet Take by mouth.    ? ketoconazole (NIZORAL) 2 % cream Twice daily to chin and corners of her mouth 15 g 0  ? levothyroxine (SYNTHROID) 75 MCG tablet Take 1 tablet (75 mcg total) by mouth daily. 90 tablet 3  ? LIDOCAINE PAIN RELIEF 4 % PTCH Apply 1 patch topically daily.    ? megestrol (MEGACE) 400 MG/10ML suspension Take 10 mLs (400 mg total) by mouth daily. 480 mL 2  ? nitroGLYCERIN (NITROSTAT) 0.4 MG SL tablet Place 0.4 mg under the tongue every 5 (five) minutes as needed for chest pain. X 3 doses    ? nystatin (MYCOSTATIN) 100000 UNIT/ML suspension Take 5 mLs (500,000 Units total) by mouth 3 (three) times daily. 60 mL 3  ? ondansetron (ZOFRAN-ODT) 4 MG disintegrating tablet Take 1 tablet (4 mg total) by mouth every 8 (eight) hours as needed. 40 tablet 2  ? pantoprazole (PROTONIX) 40 MG tablet TAKE 1 TABLET BY MOUTH TWICE DAILY 180 tablet 0  ? polyethylene glycol (MIRALAX / GLYCOLAX) 17 g packet Take 17 g by mouth 2 (two) times daily.    ? potassium chloride SA (KLOR-CON) 20 MEQ tablet TAKE 3 TABLETS BY MOUTH ONCE DAILY. 90 tablet 2  ? propranolol (INDERAL) 20 MG tablet TAKE 1 TABLET BY MOUTH 2 TIMES DAILY. 180 tablet 0  ? sennosides-docusate sodium (SENOKOT-S) 8.6-50 MG tablet Take 1 tablet by mouth 2 (two) times daily.    ? simvastatin (ZOCOR) 10 MG tablet TAKE  1 TABLET BY MOUTH DAILY FOR CHOLESTEROL 90 tablet 0  ? vortioxetine HBr (TRINTELLIX) 10 MG TABS tablet Take 1 tablet (10 mg total) by mouth daily. 30 tablet 2  ? simvastatin (ZOCOR) 10 MG tablet Take by m

## 2022-01-12 LAB — CBC WITH DIFFERENTIAL/PLATELET
Basophils Absolute: 0.1 10*3/uL (ref 0.0–0.2)
Basos: 0 %
EOS (ABSOLUTE): 0.2 10*3/uL (ref 0.0–0.4)
Eos: 1 %
Hematocrit: 36.5 % (ref 34.0–46.6)
Hemoglobin: 12.3 g/dL (ref 11.1–15.9)
Immature Grans (Abs): 0.8 10*3/uL — ABNORMAL HIGH (ref 0.0–0.1)
Immature Granulocytes: 5 %
Lymphocytes Absolute: 2.1 10*3/uL (ref 0.7–3.1)
Lymphs: 13 %
MCH: 32.9 pg (ref 26.6–33.0)
MCHC: 33.7 g/dL (ref 31.5–35.7)
MCV: 98 fL — ABNORMAL HIGH (ref 79–97)
Monocytes Absolute: 1.6 10*3/uL — ABNORMAL HIGH (ref 0.1–0.9)
Monocytes: 10 %
Neutrophils Absolute: 11.5 10*3/uL — ABNORMAL HIGH (ref 1.4–7.0)
Neutrophils: 71 %
Platelets: 438 10*3/uL (ref 150–450)
RBC: 3.74 x10E6/uL — ABNORMAL LOW (ref 3.77–5.28)
RDW: 14.9 % (ref 11.7–15.4)
WBC: 16.3 10*3/uL — ABNORMAL HIGH (ref 3.4–10.8)

## 2022-01-12 LAB — COMPREHENSIVE METABOLIC PANEL
ALT: 8 IU/L (ref 0–32)
AST: 11 IU/L (ref 0–40)
Albumin/Globulin Ratio: 1.2 (ref 1.2–2.2)
Albumin: 3.7 g/dL (ref 3.6–4.6)
Alkaline Phosphatase: 74 IU/L (ref 44–121)
BUN/Creatinine Ratio: 29 — ABNORMAL HIGH (ref 12–28)
BUN: 26 mg/dL (ref 8–27)
Bilirubin Total: 0.3 mg/dL (ref 0.0–1.2)
CO2: 29 mmol/L (ref 20–29)
Calcium: 10 mg/dL (ref 8.7–10.3)
Chloride: 102 mmol/L (ref 96–106)
Creatinine, Ser: 0.91 mg/dL (ref 0.57–1.00)
Globulin, Total: 3.2 g/dL (ref 1.5–4.5)
Glucose: 80 mg/dL (ref 70–99)
Potassium: 5.3 mmol/L — ABNORMAL HIGH (ref 3.5–5.2)
Sodium: 145 mmol/L — ABNORMAL HIGH (ref 134–144)
Total Protein: 6.9 g/dL (ref 6.0–8.5)
eGFR: 61 mL/min/{1.73_m2} (ref >59)

## 2022-01-15 ENCOUNTER — Other Ambulatory Visit: Payer: Self-pay | Admitting: Family Medicine

## 2022-01-16 ENCOUNTER — Ambulatory Visit: Payer: Medicare Other | Admitting: Emergency Medicine

## 2022-01-16 DIAGNOSIS — Z79899 Other long term (current) drug therapy: Secondary | ICD-10-CM | POA: Diagnosis not present

## 2022-01-16 DIAGNOSIS — E43 Unspecified severe protein-calorie malnutrition: Secondary | ICD-10-CM | POA: Diagnosis not present

## 2022-01-16 DIAGNOSIS — J479 Bronchiectasis, uncomplicated: Secondary | ICD-10-CM | POA: Diagnosis not present

## 2022-01-16 DIAGNOSIS — I129 Hypertensive chronic kidney disease with stage 1 through stage 4 chronic kidney disease, or unspecified chronic kidney disease: Secondary | ICD-10-CM | POA: Diagnosis not present

## 2022-01-16 DIAGNOSIS — G894 Chronic pain syndrome: Secondary | ICD-10-CM | POA: Diagnosis not present

## 2022-01-16 DIAGNOSIS — Z7982 Long term (current) use of aspirin: Secondary | ICD-10-CM | POA: Diagnosis not present

## 2022-01-16 DIAGNOSIS — S72001D Fracture of unspecified part of neck of right femur, subsequent encounter for closed fracture with routine healing: Secondary | ICD-10-CM | POA: Diagnosis not present

## 2022-01-16 DIAGNOSIS — M419 Scoliosis, unspecified: Secondary | ICD-10-CM | POA: Diagnosis not present

## 2022-01-16 DIAGNOSIS — E039 Hypothyroidism, unspecified: Secondary | ICD-10-CM | POA: Diagnosis not present

## 2022-01-16 DIAGNOSIS — Z7951 Long term (current) use of inhaled steroids: Secondary | ICD-10-CM | POA: Diagnosis not present

## 2022-01-16 DIAGNOSIS — N183 Chronic kidney disease, stage 3 unspecified: Secondary | ICD-10-CM | POA: Diagnosis not present

## 2022-01-16 DIAGNOSIS — D631 Anemia in chronic kidney disease: Secondary | ICD-10-CM | POA: Diagnosis not present

## 2022-01-16 DIAGNOSIS — Z86718 Personal history of other venous thrombosis and embolism: Secondary | ICD-10-CM | POA: Diagnosis not present

## 2022-01-16 DIAGNOSIS — Z9181 History of falling: Secondary | ICD-10-CM | POA: Diagnosis not present

## 2022-01-16 DIAGNOSIS — E785 Hyperlipidemia, unspecified: Secondary | ICD-10-CM | POA: Diagnosis not present

## 2022-01-17 ENCOUNTER — Ambulatory Visit (INDEPENDENT_AMBULATORY_CARE_PROVIDER_SITE_OTHER): Payer: Medicare Other | Admitting: Family Medicine

## 2022-01-17 VITALS — BP 138/76 | HR 72 | Temp 97.5°F | Resp 18 | Ht 59.0 in | Wt 77.0 lb

## 2022-01-17 DIAGNOSIS — Z789 Other specified health status: Secondary | ICD-10-CM

## 2022-01-17 DIAGNOSIS — J471 Bronchiectasis with (acute) exacerbation: Secondary | ICD-10-CM | POA: Diagnosis not present

## 2022-01-17 DIAGNOSIS — E43 Unspecified severe protein-calorie malnutrition: Secondary | ICD-10-CM | POA: Diagnosis not present

## 2022-01-17 DIAGNOSIS — F331 Major depressive disorder, recurrent, moderate: Secondary | ICD-10-CM

## 2022-01-17 DIAGNOSIS — E782 Mixed hyperlipidemia: Secondary | ICD-10-CM | POA: Diagnosis not present

## 2022-01-17 DIAGNOSIS — I10 Essential (primary) hypertension: Secondary | ICD-10-CM

## 2022-01-17 DIAGNOSIS — R0609 Other forms of dyspnea: Secondary | ICD-10-CM | POA: Diagnosis not present

## 2022-01-17 DIAGNOSIS — M545 Low back pain, unspecified: Secondary | ICD-10-CM

## 2022-01-17 DIAGNOSIS — Z7409 Other reduced mobility: Secondary | ICD-10-CM

## 2022-01-17 DIAGNOSIS — R600 Localized edema: Secondary | ICD-10-CM

## 2022-01-17 DIAGNOSIS — Z8781 Personal history of (healed) traumatic fracture: Secondary | ICD-10-CM | POA: Diagnosis not present

## 2022-01-17 DIAGNOSIS — E038 Other specified hypothyroidism: Secondary | ICD-10-CM

## 2022-01-17 DIAGNOSIS — M4125 Other idiopathic scoliosis, thoracolumbar region: Secondary | ICD-10-CM | POA: Diagnosis not present

## 2022-01-17 DIAGNOSIS — J9601 Acute respiratory failure with hypoxia: Secondary | ICD-10-CM | POA: Diagnosis not present

## 2022-01-17 DIAGNOSIS — G8929 Other chronic pain: Secondary | ICD-10-CM

## 2022-01-17 DIAGNOSIS — R251 Tremor, unspecified: Secondary | ICD-10-CM

## 2022-01-17 DIAGNOSIS — M79604 Pain in right leg: Secondary | ICD-10-CM

## 2022-01-17 MED ORDER — PROPRANOLOL HCL 40 MG PO TABS: 40.0000 mg | ORAL_TABLET | Freq: Three times a day (TID) | ORAL | 2 refills | Status: AC

## 2022-01-17 MED ORDER — DULOXETINE HCL 30 MG PO CPEP: ORAL_CAPSULE | ORAL | 0 refills | Status: AC

## 2022-01-17 NOTE — Patient Instructions (Addendum)
1) Discontinue trintellix. Start duloxetine 30 mg once daily x 1 week, then increase to 30 mg twice daily.  ? ?2) Medications for pain: ?  Tylenol. ? Hydrocodone/apap 5/325 mg one twice a day as needed for severe pain. ? Lidoderm patches otc.  ? Flector patch (diclofenac) - not sure if was approved through insurance.  ? ? ?3) Elevate LEGS PLEASE!! ? ?4) Increase propranolol to 40 mg twice daily for 1-2 weeks. Then increase to 40 mg three times a day.  ? ?5. Check bp and heart rate daily.  ?

## 2022-01-17 NOTE — Progress Notes (Addendum)
? ?Subjective:  ?Patient ID: Stephanie Clay, female    DOB: 27-Mar-1932  Age: 88 y.o. MRN: [id number] ? ?Chief Complaint  ?Patient presents with  ? Fatigue  ? ? ?HPI ?Patient is an 88 yo WF with chronic bronchiectasis, SVT, hyperlipidemia, hypothyroidism, chronic back pain, and malnutrition with recent fracture of right hip. She went to [facility] and was discharged 3 weeks ago. She has home physical and occupational therapy. Her right lower leg hurts and has since the surgery. She has had doppler US x 2 neither showing DVT. ?Bronchiectasis: patient is on breztri 2 puffs twice daily and albuterol nebulizers. Patient was seen on 01/11/2022 by Adron Bene, PAC in our office and she was treated with rocephin, prednisone taper, and bactrim ds. Patient "just feels bad she says. She always feels bad." Patient feels better if head elevated in bed. Becomes short of breath when lays down or exerts herself.  ?SVT: diltiazem 30 mg three times a day.  ?GERD: on pantoprazole 40 mg one twice daily.  ?Hyperlipidemia: on zocor 10 mg daily.  ?Tremor: on propranolol 40 mg one three times a day.Marland Kitchen  ?Chronic back pain/severe scoliosis: Patient has constant back pain.Her back hurts. She has severe scoliosis. Patient has acetaminophen, hydrocodone/apap. Lidoderm patches otc, but has not tried in a  while. In addition had recent right femur fracture with surgery. Right leg is very weak and has difficulty standing up and walking. Patient has followed up with orthopedics. Back pain is worse if lays flat.  ? ?Current Outpatient Medications on File Prior to Visit  ?Medication Sig Dispense Refill  ? acetaminophen (TYLENOL) 325 MG tablet Take 650 mg by mouth every 6 (six) hours as needed for mild pain.    ? albuterol (PROVENTIL) (2.5 MG/3ML) 0.083% nebulizer solution Take by nebulization.    ? albuterol (VENTOLIN HFA) 108 (90 Base) MCG/ACT inhaler Inhale 2 puffs into the lungs every 6 (six) hours as needed for wheezing or  shortness of breath. 8 g 6  ? ALPRAZolam (XANAX) 0.5 MG tablet TAKE 1 TABLET BY MOUTH AT BEDTIME. 30 tablet 1  ? ascorbic acid (VITAMIN C) 500 MG tablet Take by mouth.    ? aspirin 325 MG tablet Take 325 mg by mouth 3 (three) times daily before meals.    ? bethanechol (URECHOLINE) 50 MG tablet Take 50 mg by mouth 2 (two) times daily.    ? Budeson-Glycopyrrol-Formoterol (BREZTRI AEROSPHERE) 160-9-4.8 MCG/ACT AERO Inhale 2 puffs into the lungs 2 (two) times daily. 10.7 g 3  ? Cholecalciferol (VITAMIN D3 PO) Take 2,000 Units by mouth daily.    ? cyanocobalamin 1000 MCG tablet Take by mouth.    ? Cyanocobalamin 2500 MCG TABS Take 2,500 mcg by mouth daily.    ? diclofenac (FLECTOR) 1.3 % PTCH Place 1 patch onto the skin 2 (two) times daily. 60 patch 2  ? diclofenac Sodium (VOLTAREN) 1 % GEL Apply 1 application. topically 4 (four) times daily.    ? dicyclomine (BENTYL) 20 MG tablet Take 1 tablet (20 mg total) by mouth 4 (four) times daily -  before meals and at bedtime. 120 tablet 3  ? fluticasone (FLONASE) 50 MCG/ACT nasal spray Place 2 sprays into both nostrils as needed for allergies or rhinitis.    ? gabapentin (NEURONTIN) 300 MG capsule TAKE 1 CAPSULE BY MOUTH 2 TIMES DAILY 180 capsule 0  ? HYDROcodone-acetaminophen (NORCO/VICODIN) 5-325 MG tablet Take 1 tablet by mouth 2 (two) times daily as needed for severe  pain (OSteoarthritis of lumbar spine.). 60 tablet 0  ? K-PHOS 500 MG tablet Take by mouth.    ? ketoconazole (NIZORAL) 2 % cream Twice daily to chin and corners of her mouth 15 g 0  ? levothyroxine (SYNTHROID) 75 MCG tablet Take 1 tablet (75 mcg total) by mouth daily. 90 tablet 3  ? LIDOCAINE PAIN RELIEF 4 % PTCH Apply 1 patch topically daily.    ? megestrol (MEGACE) 400 MG/10ML suspension Take 10 mLs (400 mg total) by mouth daily. 480 mL 2  ? nitroGLYCERIN (NITROSTAT) 0.4 MG SL tablet Place 0.4 mg under the tongue every 5 (five) minutes as needed for chest pain. X 3 doses    ? ondansetron (ZOFRAN-ODT) 4 MG  disintegrating tablet Take 1 tablet (4 mg total) by mouth every 8 (eight) hours as needed. 40 tablet 2  ? polyethylene glycol (MIRALAX / GLYCOLAX) 17 g packet Take 17 g by mouth 2 (two) times daily.    ? potassium chloride SA (KLOR-CON) 20 MEQ tablet TAKE 3 TABLETS BY MOUTH ONCE DAILY. 90 tablet 2  ? sennosides-docusate sodium (SENOKOT-S) 8.6-50 MG tablet Take 1 tablet by mouth 2 (two) times daily.    ? sulfamethoxazole-trimethoprim (BACTRIM DS) 800-160 MG tablet Take 1 tablet by mouth 2 (two) times daily. 20 tablet 0  ? Cholecalciferol (VITAMIN D3) 125 MCG (5000 UT) CAPS Take 1 capsule by mouth daily.    ? ?No current facility-administered medications on file prior to visit.  ? ?Past Medical History:  ?Diagnosis Date  ? Abdominal pain 01/27/2018  ? AKI (acute kidney injury) ([place]) 06/30/2016  ? Allergic rhinitis 08/20/2016  ? Anemia   ? Aneurysm of other specified arteries ([place])   ? Anxiety   ? Arthritis   ? Asthma   ? Bronchiectasis with evidence of chronic atypical infection 01/03/2015  ? CT chest 12/2014:  Bronchiectasis throughout right lung, left clear.  +tree and bud, + patchy gg infiltrates in RLL Sputum 2016: no growth, AFB smear neg PFT's 02/2015:  Normal except mild decrease in DLCO  ? Bronchiectasis without acute exacerbation ([place]) 01/03/2015  ? CT chest 12/2014:  Bronchiectasis throughout right lung, left clear.  +tree and bud, + patchy gg infiltrates in RLL Sputum 2016: no growth, AFB smear neg PFT's 02/2015:  Normal except mild decrease in DLCO   ? Cataract   ? bilateral eye surgery  ? Chronic bilateral low back pain without sciatica 11/29/2019  ? Chronic chest pain   ? Chronic idiopathic constipation   ? Chronic kidney disease, stage 3 (HCC)   ? Chronic venous hypertension (idiopathic) with other complications of bilateral lower extremity   ? Decreased pedal pulses 03/24/2020  ? Depression   ? Dysphagia 03/01/2015  ? Essential hypertension 07/01/2016  ? Gastro-esophageal reflux   ? Heart murmur   ? Hyperglycemia  07/01/2016  ? Hyperlipemia   ? Hyperlipidemia 07/01/2016  ? Hypertension   ? Hypokalemia 07/01/2016  ? Hypomagnesemia 07/01/2016  ? Hypothyroidism 07/01/2016  ? Idiopathic progressive neuropathy   ? Iron (Fe) deficiency anemia   ? Joint pain 03/24/2020  ? Localized edema 11/26/2019  ? Mild vitamin D deficiency 11/29/2019  ? Moderate protein-calorie malnutrition ([place])   ? Normocytic anemia 01/07/2020  ? Other fatigue 11/26/2019  ? Other idiopathic scoliosis, lumbar region   ? Pneumathemia ([place])   ? Primary generalized (osteo)arthritis   ? Primary insomnia   ? Radiculopathy, lumbar region   ? Scoliosis   ? Secondary hyperparathyroidism ([place])   ?  Severe protein-calorie malnutrition ([place]) 03/24/2020  ? Underweight 07/02/2016  ? Unilateral primary osteoarthritis, left knee   ? Unilateral primary osteoarthritis, right knee   ? ?Past Surgical History:  ?Procedure Laterality Date  ? ABDOMINAL HYSTERECTOMY    ? CYSTOCELE REPAIR    ? HEMIARTHROPLASTY HIP Right 11/2021  ? SALPINGOOPHORECTOMY    ?  ?Family History  ?Problem Relation Age of Onset  ? Emphysema Father   ? Heart disease Mother   ? Cancer Sister   ?     metastatic lung cancer  ? Heart disease Sister   ? Hypertension Sister   ? Varicose Veins Sister   ? Hyperlipidemia Daughter   ? Hypertension Daughter   ? Diabetes Daughter   ? Hyperlipidemia Daughter   ? Hypertension Daughter   ? Thyroid disease Daughter   ? Thyroid disease Daughter   ? Colon cancer Neg Hx   ? Esophageal cancer Neg Hx   ? Stomach cancer Neg Hx   ? ?Social History  ? ?Socioeconomic History  ? Marital status: Widowed  ?  Spouse name: carl  ? Number of children: 3  ? Years of education: 74  ? Highest education level: Not on file  ?Occupational History  ? Occupation: retired  ?Tobacco Use  ? Smoking status: Never  ? Smokeless tobacco: Never  ?Vaping Use  ? Vaping Use: Never used  ?Substance and Sexual Activity  ? Alcohol use: No  ?  Alcohol/week: 0.0 standard drinks  ? Drug use: No  ? Sexual activity: Not Currently   ?Other Topics Concern  ? Not on file  ?Social History Narrative  ? Right handed  ? ?Social Determinants of Health  ? ?Financial Resource Strain: High Risk  ? Difficulty of Paying Living Expenses: Hard  ?Food Insec

## 2022-01-18 ENCOUNTER — Telehealth: Payer: Self-pay

## 2022-01-18 ENCOUNTER — Encounter: Payer: Self-pay | Admitting: Family Medicine

## 2022-01-18 DIAGNOSIS — M79604 Pain in right leg: Secondary | ICD-10-CM

## 2022-01-18 DIAGNOSIS — E785 Hyperlipidemia, unspecified: Secondary | ICD-10-CM | POA: Diagnosis not present

## 2022-01-18 DIAGNOSIS — J479 Bronchiectasis, uncomplicated: Secondary | ICD-10-CM | POA: Diagnosis not present

## 2022-01-18 DIAGNOSIS — J9601 Acute respiratory failure with hypoxia: Secondary | ICD-10-CM

## 2022-01-18 DIAGNOSIS — I129 Hypertensive chronic kidney disease with stage 1 through stage 4 chronic kidney disease, or unspecified chronic kidney disease: Secondary | ICD-10-CM | POA: Diagnosis not present

## 2022-01-18 DIAGNOSIS — E43 Unspecified severe protein-calorie malnutrition: Secondary | ICD-10-CM | POA: Diagnosis not present

## 2022-01-18 DIAGNOSIS — G894 Chronic pain syndrome: Secondary | ICD-10-CM | POA: Diagnosis not present

## 2022-01-18 DIAGNOSIS — Z8781 Personal history of (healed) traumatic fracture: Secondary | ICD-10-CM

## 2022-01-18 DIAGNOSIS — Z7951 Long term (current) use of inhaled steroids: Secondary | ICD-10-CM | POA: Diagnosis not present

## 2022-01-18 DIAGNOSIS — Z7982 Long term (current) use of aspirin: Secondary | ICD-10-CM | POA: Diagnosis not present

## 2022-01-18 DIAGNOSIS — S72001D Fracture of unspecified part of neck of right femur, subsequent encounter for closed fracture with routine healing: Secondary | ICD-10-CM | POA: Diagnosis not present

## 2022-01-18 DIAGNOSIS — Z86718 Personal history of other venous thrombosis and embolism: Secondary | ICD-10-CM | POA: Diagnosis not present

## 2022-01-18 DIAGNOSIS — R0609 Other forms of dyspnea: Secondary | ICD-10-CM

## 2022-01-18 DIAGNOSIS — D631 Anemia in chronic kidney disease: Secondary | ICD-10-CM | POA: Diagnosis not present

## 2022-01-18 DIAGNOSIS — R251 Tremor, unspecified: Secondary | ICD-10-CM

## 2022-01-18 DIAGNOSIS — M419 Scoliosis, unspecified: Secondary | ICD-10-CM | POA: Diagnosis not present

## 2022-01-18 DIAGNOSIS — E039 Hypothyroidism, unspecified: Secondary | ICD-10-CM | POA: Diagnosis not present

## 2022-01-18 DIAGNOSIS — Z9181 History of falling: Secondary | ICD-10-CM | POA: Diagnosis not present

## 2022-01-18 DIAGNOSIS — N183 Chronic kidney disease, stage 3 unspecified: Secondary | ICD-10-CM | POA: Diagnosis not present

## 2022-01-18 DIAGNOSIS — Z79899 Other long term (current) drug therapy: Secondary | ICD-10-CM | POA: Diagnosis not present

## 2022-01-18 HISTORY — DX: Personal history of (healed) traumatic fracture: Z87.81

## 2022-01-18 HISTORY — DX: Other forms of dyspnea: R06.09

## 2022-01-18 HISTORY — DX: Acute respiratory failure with hypoxia: J96.01

## 2022-01-18 HISTORY — DX: Pain in right leg: M79.604

## 2022-01-18 HISTORY — DX: Tremor, unspecified: R25.1

## 2022-01-18 LAB — CBC WITH DIFFERENTIAL/PLATELET
Basophils Absolute: 0.1 10*3/uL (ref 0.0–0.2)
Basos: 1 %
EOS (ABSOLUTE): 0 10*3/uL (ref 0.0–0.4)
Eos: 0 %
Hematocrit: 34 % (ref 34.0–46.6)
Hemoglobin: 11.7 g/dL (ref 11.1–15.9)
Immature Grans (Abs): 1 10*3/uL — ABNORMAL HIGH (ref 0.0–0.1)
Immature Granulocytes: 4 %
Lymphocytes Absolute: 1.2 10*3/uL (ref 0.7–3.1)
Lymphs: 5 %
MCH: 32.9 pg (ref 26.6–33.0)
MCHC: 34.4 g/dL (ref 31.5–35.7)
MCV: 96 fL (ref 79–97)
Monocytes Absolute: 1.2 10*3/uL — ABNORMAL HIGH (ref 0.1–0.9)
Monocytes: 5 %
Neutrophils Absolute: 21.5 10*3/uL — ABNORMAL HIGH (ref 1.4–7.0)
Neutrophils: 85 %
Platelets: 376 10*3/uL (ref 150–450)
RBC: 3.56 x10E6/uL — ABNORMAL LOW (ref 3.77–5.28)
RDW: 15.2 % (ref 11.7–15.4)
WBC: 25 10*3/uL (ref 3.4–10.8)

## 2022-01-18 LAB — COMPREHENSIVE METABOLIC PANEL
ALT: 9 IU/L (ref 0–32)
AST: 14 IU/L (ref 0–40)
Albumin/Globulin Ratio: 1.3 (ref 1.2–2.2)
Albumin: 3.9 g/dL (ref 3.6–4.6)
Alkaline Phosphatase: 79 IU/L (ref 44–121)
BUN/Creatinine Ratio: 27 (ref 12–28)
BUN: 29 mg/dL — ABNORMAL HIGH (ref 8–27)
Bilirubin Total: 0.3 mg/dL (ref 0.0–1.2)
CO2: 27 mmol/L (ref 20–29)
Calcium: 9.9 mg/dL (ref 8.7–10.3)
Chloride: 105 mmol/L (ref 96–106)
Creatinine, Ser: 1.07 mg/dL — ABNORMAL HIGH (ref 0.57–1.00)
Globulin, Total: 3.1 g/dL (ref 1.5–4.5)
Glucose: 78 mg/dL (ref 70–99)
Potassium: 4.4 mmol/L (ref 3.5–5.2)
Sodium: 145 mmol/L — ABNORMAL HIGH (ref 134–144)
Total Protein: 7 g/dL (ref 6.0–8.5)
eGFR: 50 mL/min/{1.73_m2} — ABNORMAL LOW (ref >59)

## 2022-01-18 NOTE — Assessment & Plan Note (Signed)
The current medical regimen is effective;  continue present plan and medications.  

## 2022-01-18 NOTE — Telephone Encounter (Signed)
The home health nurse from [facility] called, Stephanie Clay, with some questions about Stephanie Clay. ? ?1- The family wants her to finish the Trintellix they have on hand due to the cost of it --- advised to start duloxetine as prescribed only after stopping the trintellix. ? ?2- Stephanie Clay is desatting when ambulating -- home health nurse is going to fax over results for O2 orders ? ?I spoke with Dr Sedalia Muta and she has received the labs back from yesterday.  With the elevated WBC Dr Sedalia Muta recommends patient go to the ED.  Stephanie Clay spoke with the family and they agreed to take [facility] to the ED. ?

## 2022-01-18 NOTE — Assessment & Plan Note (Addendum)
Complete prednisone.  ?Home health care to arrange home cxr. Ordered augmentin as it came back showing persistent infiltrate although improved.  ?Patient needs hospital bed to elevate head of bed greater than 30 degrees to improve breathing.  ?

## 2022-01-18 NOTE — Assessment & Plan Note (Addendum)
S/P hemiarthoplasty. ?Mobility affected.  ?Ordering hospital bed.  ?

## 2022-01-18 NOTE — Assessment & Plan Note (Signed)
Ordered probnp and ddimer 01/18/2022. Ordering cxr.  ?

## 2022-01-18 NOTE — Assessment & Plan Note (Signed)
Recommend patient eat 3 meals per day.  ?

## 2022-01-18 NOTE — Assessment & Plan Note (Signed)
Increase propranolol to 40 mg twice daily for 1-2 weeks. Then increase to 40 mg three times a day.  ? ?Check bp and heart rate daily.  ?

## 2022-01-18 NOTE — Assessment & Plan Note (Signed)
Discontinue trintellix. Start duloxetine 30 mg once daily x 1 week, then increase to 30 mg twice daily.  ?

## 2022-01-18 NOTE — Assessment & Plan Note (Signed)
Patient was evaluated by home health care nursing on 01/18/3022:  ?Pulse oximetry ?RA at rest: 92% ?RA upon standing 91%. ?RA after 6 minutes ambulation dropped to 84% ?Patient then was returned to seating and upon 2 minutes of rest and focused deep breathing returned to 92%. ?Home health care did not have oxygen to give a trial.  ?Order oxygen through [facility].  ?

## 2022-01-18 NOTE — Assessment & Plan Note (Addendum)
Medications for pain: ?  Tylenol. ? Hydrocodone/apap 5/325 mg one twice a day as needed for severe pain. ? Lidoderm patches otc.  ? Flector patch (diclofenac) - not sure if was approved through insurance.  ?Start cymbalta. ? ? ?

## 2022-01-18 NOTE — Assessment & Plan Note (Signed)
Elevate legs 

## 2022-01-18 NOTE — Assessment & Plan Note (Signed)
Check DDimer.  ?Check ultrasound of right leg unless ddimer comes back normal.  ?

## 2022-01-18 NOTE — Assessment & Plan Note (Signed)
Medications for pain: ?  Tylenol. ? Hydrocodone/apap 5/325 mg one twice a day as needed for severe pain. ? Lidoderm patches otc.  ? Flector patch (diclofenac) - not sure if was approved through insurance.  ?Start cymbalta. ? ? ?

## 2022-01-18 NOTE — Assessment & Plan Note (Signed)
Ordering hospital bed.  ?

## 2022-01-19 ENCOUNTER — Other Ambulatory Visit: Payer: Self-pay | Admitting: Family Medicine

## 2022-01-19 DIAGNOSIS — Z79899 Other long term (current) drug therapy: Secondary | ICD-10-CM | POA: Diagnosis not present

## 2022-01-19 DIAGNOSIS — Z86718 Personal history of other venous thrombosis and embolism: Secondary | ICD-10-CM | POA: Diagnosis not present

## 2022-01-19 DIAGNOSIS — Z7951 Long term (current) use of inhaled steroids: Secondary | ICD-10-CM | POA: Diagnosis not present

## 2022-01-19 DIAGNOSIS — E43 Unspecified severe protein-calorie malnutrition: Secondary | ICD-10-CM | POA: Diagnosis not present

## 2022-01-19 DIAGNOSIS — Z7982 Long term (current) use of aspirin: Secondary | ICD-10-CM | POA: Diagnosis not present

## 2022-01-19 DIAGNOSIS — D631 Anemia in chronic kidney disease: Secondary | ICD-10-CM | POA: Diagnosis not present

## 2022-01-19 DIAGNOSIS — N183 Chronic kidney disease, stage 3 unspecified: Secondary | ICD-10-CM | POA: Diagnosis not present

## 2022-01-19 DIAGNOSIS — J479 Bronchiectasis, uncomplicated: Secondary | ICD-10-CM | POA: Diagnosis not present

## 2022-01-19 DIAGNOSIS — Z9181 History of falling: Secondary | ICD-10-CM | POA: Diagnosis not present

## 2022-01-19 DIAGNOSIS — J471 Bronchiectasis with (acute) exacerbation: Secondary | ICD-10-CM | POA: Diagnosis not present

## 2022-01-19 DIAGNOSIS — G894 Chronic pain syndrome: Secondary | ICD-10-CM | POA: Diagnosis not present

## 2022-01-19 DIAGNOSIS — R0902 Hypoxemia: Secondary | ICD-10-CM | POA: Diagnosis not present

## 2022-01-19 DIAGNOSIS — M419 Scoliosis, unspecified: Secondary | ICD-10-CM | POA: Diagnosis not present

## 2022-01-19 DIAGNOSIS — I129 Hypertensive chronic kidney disease with stage 1 through stage 4 chronic kidney disease, or unspecified chronic kidney disease: Secondary | ICD-10-CM | POA: Diagnosis not present

## 2022-01-19 DIAGNOSIS — E039 Hypothyroidism, unspecified: Secondary | ICD-10-CM | POA: Diagnosis not present

## 2022-01-19 DIAGNOSIS — S72001D Fracture of unspecified part of neck of right femur, subsequent encounter for closed fracture with routine healing: Secondary | ICD-10-CM | POA: Diagnosis not present

## 2022-01-19 DIAGNOSIS — E785 Hyperlipidemia, unspecified: Secondary | ICD-10-CM | POA: Diagnosis not present

## 2022-01-19 MED ORDER — TORSEMIDE 20 MG PO TABS
20.0000 mg | ORAL_TABLET | Freq: Every day | ORAL | 2 refills | Status: DC
Start: 2022-01-19 — End: 2022-02-01

## 2022-01-21 ENCOUNTER — Encounter: Payer: Self-pay | Admitting: Family Medicine

## 2022-01-21 DIAGNOSIS — R0602 Shortness of breath: Secondary | ICD-10-CM | POA: Diagnosis not present

## 2022-01-22 ENCOUNTER — Other Ambulatory Visit: Payer: Self-pay | Admitting: Family Medicine

## 2022-01-22 ENCOUNTER — Telehealth: Payer: Self-pay

## 2022-01-22 DIAGNOSIS — R601 Generalized edema: Secondary | ICD-10-CM | POA: Diagnosis not present

## 2022-01-22 NOTE — Telephone Encounter (Signed)
Stacie called requesting that a referral be sent to [facility]. She stated that her mother is getting worse. [facility] is supposed to come out but they are not even coming out. If they do come, they have to be called to come. Stacie stated that [facility] worked great with [facility] and took good care of her. ? ? ?

## 2022-01-22 NOTE — Telephone Encounter (Signed)
Refill sent to pharmacy.   

## 2022-01-23 ENCOUNTER — Other Ambulatory Visit: Payer: Self-pay

## 2022-01-23 ENCOUNTER — Other Ambulatory Visit: Payer: Self-pay | Admitting: Family Medicine

## 2022-01-23 DIAGNOSIS — G894 Chronic pain syndrome: Secondary | ICD-10-CM | POA: Diagnosis not present

## 2022-01-23 DIAGNOSIS — Z7951 Long term (current) use of inhaled steroids: Secondary | ICD-10-CM | POA: Diagnosis not present

## 2022-01-23 DIAGNOSIS — R269 Unspecified abnormalities of gait and mobility: Secondary | ICD-10-CM

## 2022-01-23 DIAGNOSIS — E43 Unspecified severe protein-calorie malnutrition: Secondary | ICD-10-CM

## 2022-01-23 DIAGNOSIS — N183 Chronic kidney disease, stage 3 unspecified: Secondary | ICD-10-CM | POA: Diagnosis not present

## 2022-01-23 DIAGNOSIS — R29898 Other symptoms and signs involving the musculoskeletal system: Secondary | ICD-10-CM

## 2022-01-23 DIAGNOSIS — E785 Hyperlipidemia, unspecified: Secondary | ICD-10-CM | POA: Diagnosis not present

## 2022-01-23 DIAGNOSIS — I129 Hypertensive chronic kidney disease with stage 1 through stage 4 chronic kidney disease, or unspecified chronic kidney disease: Secondary | ICD-10-CM | POA: Diagnosis not present

## 2022-01-23 DIAGNOSIS — Z79899 Other long term (current) drug therapy: Secondary | ICD-10-CM | POA: Diagnosis not present

## 2022-01-23 DIAGNOSIS — Z86718 Personal history of other venous thrombosis and embolism: Secondary | ICD-10-CM | POA: Diagnosis not present

## 2022-01-23 DIAGNOSIS — S72001D Fracture of unspecified part of neck of right femur, subsequent encounter for closed fracture with routine healing: Secondary | ICD-10-CM | POA: Diagnosis not present

## 2022-01-23 DIAGNOSIS — Z7982 Long term (current) use of aspirin: Secondary | ICD-10-CM | POA: Diagnosis not present

## 2022-01-23 DIAGNOSIS — Z9181 History of falling: Secondary | ICD-10-CM | POA: Diagnosis not present

## 2022-01-23 DIAGNOSIS — Z789 Other specified health status: Secondary | ICD-10-CM

## 2022-01-23 DIAGNOSIS — J479 Bronchiectasis, uncomplicated: Secondary | ICD-10-CM | POA: Diagnosis not present

## 2022-01-23 DIAGNOSIS — E039 Hypothyroidism, unspecified: Secondary | ICD-10-CM | POA: Diagnosis not present

## 2022-01-23 DIAGNOSIS — D631 Anemia in chronic kidney disease: Secondary | ICD-10-CM | POA: Diagnosis not present

## 2022-01-23 DIAGNOSIS — M419 Scoliosis, unspecified: Secondary | ICD-10-CM | POA: Diagnosis not present

## 2022-01-23 MED ORDER — SIMVASTATIN 10 MG PO TABS
ORAL_TABLET | ORAL | 0 refills | Status: AC
Start: 2022-01-23 — End: ?

## 2022-01-23 NOTE — Telephone Encounter (Signed)
Refill sent to pharmacy.   

## 2022-01-24 ENCOUNTER — Other Ambulatory Visit: Payer: Self-pay

## 2022-01-24 DIAGNOSIS — D631 Anemia in chronic kidney disease: Secondary | ICD-10-CM | POA: Diagnosis not present

## 2022-01-24 DIAGNOSIS — E785 Hyperlipidemia, unspecified: Secondary | ICD-10-CM | POA: Diagnosis not present

## 2022-01-24 DIAGNOSIS — G894 Chronic pain syndrome: Secondary | ICD-10-CM | POA: Diagnosis not present

## 2022-01-24 DIAGNOSIS — S72001D Fracture of unspecified part of neck of right femur, subsequent encounter for closed fracture with routine healing: Secondary | ICD-10-CM | POA: Diagnosis not present

## 2022-01-24 DIAGNOSIS — E43 Unspecified severe protein-calorie malnutrition: Secondary | ICD-10-CM | POA: Diagnosis not present

## 2022-01-24 DIAGNOSIS — E039 Hypothyroidism, unspecified: Secondary | ICD-10-CM | POA: Diagnosis not present

## 2022-01-24 DIAGNOSIS — I129 Hypertensive chronic kidney disease with stage 1 through stage 4 chronic kidney disease, or unspecified chronic kidney disease: Secondary | ICD-10-CM | POA: Diagnosis not present

## 2022-01-24 DIAGNOSIS — M419 Scoliosis, unspecified: Secondary | ICD-10-CM | POA: Diagnosis not present

## 2022-01-24 DIAGNOSIS — Z7982 Long term (current) use of aspirin: Secondary | ICD-10-CM | POA: Diagnosis not present

## 2022-01-24 DIAGNOSIS — Z7951 Long term (current) use of inhaled steroids: Secondary | ICD-10-CM | POA: Diagnosis not present

## 2022-01-24 DIAGNOSIS — Z9181 History of falling: Secondary | ICD-10-CM | POA: Diagnosis not present

## 2022-01-24 DIAGNOSIS — Z86718 Personal history of other venous thrombosis and embolism: Secondary | ICD-10-CM | POA: Diagnosis not present

## 2022-01-24 DIAGNOSIS — Z79899 Other long term (current) drug therapy: Secondary | ICD-10-CM | POA: Diagnosis not present

## 2022-01-24 DIAGNOSIS — J479 Bronchiectasis, uncomplicated: Secondary | ICD-10-CM | POA: Diagnosis not present

## 2022-01-24 DIAGNOSIS — N183 Chronic kidney disease, stage 3 unspecified: Secondary | ICD-10-CM | POA: Diagnosis not present

## 2022-01-24 MED ORDER — AMOXICILLIN-POT CLAVULANATE 875-125 MG PO TABS: 1.0000 | ORAL_TABLET | Freq: Two times a day (BID) | ORAL | 0 refills | Status: DC

## 2022-01-25 DIAGNOSIS — Z86718 Personal history of other venous thrombosis and embolism: Secondary | ICD-10-CM | POA: Diagnosis not present

## 2022-01-25 DIAGNOSIS — M47816 Spondylosis without myelopathy or radiculopathy, lumbar region: Secondary | ICD-10-CM | POA: Diagnosis not present

## 2022-01-25 DIAGNOSIS — S72001D Fracture of unspecified part of neck of right femur, subsequent encounter for closed fracture with routine healing: Secondary | ICD-10-CM | POA: Diagnosis not present

## 2022-01-25 DIAGNOSIS — G894 Chronic pain syndrome: Secondary | ICD-10-CM | POA: Diagnosis not present

## 2022-01-25 DIAGNOSIS — E785 Hyperlipidemia, unspecified: Secondary | ICD-10-CM | POA: Diagnosis not present

## 2022-01-25 DIAGNOSIS — I129 Hypertensive chronic kidney disease with stage 1 through stage 4 chronic kidney disease, or unspecified chronic kidney disease: Secondary | ICD-10-CM | POA: Diagnosis not present

## 2022-01-25 DIAGNOSIS — M419 Scoliosis, unspecified: Secondary | ICD-10-CM | POA: Diagnosis not present

## 2022-01-25 DIAGNOSIS — J479 Bronchiectasis, uncomplicated: Secondary | ICD-10-CM | POA: Diagnosis not present

## 2022-01-25 DIAGNOSIS — N183 Chronic kidney disease, stage 3 unspecified: Secondary | ICD-10-CM | POA: Diagnosis not present

## 2022-01-25 DIAGNOSIS — Z7951 Long term (current) use of inhaled steroids: Secondary | ICD-10-CM | POA: Diagnosis not present

## 2022-01-25 DIAGNOSIS — Z9181 History of falling: Secondary | ICD-10-CM | POA: Diagnosis not present

## 2022-01-25 DIAGNOSIS — E43 Unspecified severe protein-calorie malnutrition: Secondary | ICD-10-CM | POA: Diagnosis not present

## 2022-01-25 DIAGNOSIS — E039 Hypothyroidism, unspecified: Secondary | ICD-10-CM | POA: Diagnosis not present

## 2022-01-25 DIAGNOSIS — Z7982 Long term (current) use of aspirin: Secondary | ICD-10-CM | POA: Diagnosis not present

## 2022-01-25 DIAGNOSIS — D631 Anemia in chronic kidney disease: Secondary | ICD-10-CM | POA: Diagnosis not present

## 2022-01-25 DIAGNOSIS — Z471 Aftercare following joint replacement surgery: Secondary | ICD-10-CM | POA: Diagnosis not present

## 2022-01-25 DIAGNOSIS — Z96641 Presence of right artificial hip joint: Secondary | ICD-10-CM | POA: Diagnosis not present

## 2022-01-25 DIAGNOSIS — Z79899 Other long term (current) drug therapy: Secondary | ICD-10-CM | POA: Diagnosis not present

## 2022-01-26 DIAGNOSIS — D631 Anemia in chronic kidney disease: Secondary | ICD-10-CM | POA: Diagnosis not present

## 2022-01-26 DIAGNOSIS — I129 Hypertensive chronic kidney disease with stage 1 through stage 4 chronic kidney disease, or unspecified chronic kidney disease: Secondary | ICD-10-CM | POA: Diagnosis not present

## 2022-01-26 DIAGNOSIS — Z79899 Other long term (current) drug therapy: Secondary | ICD-10-CM | POA: Diagnosis not present

## 2022-01-26 DIAGNOSIS — E785 Hyperlipidemia, unspecified: Secondary | ICD-10-CM | POA: Diagnosis not present

## 2022-01-26 DIAGNOSIS — J479 Bronchiectasis, uncomplicated: Secondary | ICD-10-CM | POA: Diagnosis not present

## 2022-01-26 DIAGNOSIS — Z7951 Long term (current) use of inhaled steroids: Secondary | ICD-10-CM | POA: Diagnosis not present

## 2022-01-26 DIAGNOSIS — Z7982 Long term (current) use of aspirin: Secondary | ICD-10-CM | POA: Diagnosis not present

## 2022-01-26 DIAGNOSIS — E039 Hypothyroidism, unspecified: Secondary | ICD-10-CM | POA: Diagnosis not present

## 2022-01-26 DIAGNOSIS — G894 Chronic pain syndrome: Secondary | ICD-10-CM | POA: Diagnosis not present

## 2022-01-26 DIAGNOSIS — E43 Unspecified severe protein-calorie malnutrition: Secondary | ICD-10-CM | POA: Diagnosis not present

## 2022-01-26 DIAGNOSIS — M419 Scoliosis, unspecified: Secondary | ICD-10-CM | POA: Diagnosis not present

## 2022-01-26 DIAGNOSIS — S72001D Fracture of unspecified part of neck of right femur, subsequent encounter for closed fracture with routine healing: Secondary | ICD-10-CM | POA: Diagnosis not present

## 2022-01-26 DIAGNOSIS — Z86718 Personal history of other venous thrombosis and embolism: Secondary | ICD-10-CM | POA: Diagnosis not present

## 2022-01-26 DIAGNOSIS — Z9181 History of falling: Secondary | ICD-10-CM | POA: Diagnosis not present

## 2022-01-26 DIAGNOSIS — N183 Chronic kidney disease, stage 3 unspecified: Secondary | ICD-10-CM | POA: Diagnosis not present

## 2022-01-28 ENCOUNTER — Other Ambulatory Visit: Payer: Self-pay | Admitting: Family Medicine

## 2022-01-29 ENCOUNTER — Telehealth: Payer: Self-pay

## 2022-01-29 ENCOUNTER — Other Ambulatory Visit: Payer: Self-pay | Admitting: Family Medicine

## 2022-01-29 DIAGNOSIS — Z86718 Personal history of other venous thrombosis and embolism: Secondary | ICD-10-CM | POA: Diagnosis not present

## 2022-01-29 DIAGNOSIS — N183 Chronic kidney disease, stage 3 unspecified: Secondary | ICD-10-CM | POA: Diagnosis not present

## 2022-01-29 DIAGNOSIS — J479 Bronchiectasis, uncomplicated: Secondary | ICD-10-CM | POA: Diagnosis not present

## 2022-01-29 DIAGNOSIS — I129 Hypertensive chronic kidney disease with stage 1 through stage 4 chronic kidney disease, or unspecified chronic kidney disease: Secondary | ICD-10-CM | POA: Diagnosis not present

## 2022-01-29 DIAGNOSIS — D631 Anemia in chronic kidney disease: Secondary | ICD-10-CM | POA: Diagnosis not present

## 2022-01-29 DIAGNOSIS — E785 Hyperlipidemia, unspecified: Secondary | ICD-10-CM | POA: Diagnosis not present

## 2022-01-29 DIAGNOSIS — Z9181 History of falling: Secondary | ICD-10-CM | POA: Diagnosis not present

## 2022-01-29 DIAGNOSIS — E039 Hypothyroidism, unspecified: Secondary | ICD-10-CM | POA: Diagnosis not present

## 2022-01-29 DIAGNOSIS — Z7982 Long term (current) use of aspirin: Secondary | ICD-10-CM | POA: Diagnosis not present

## 2022-01-29 DIAGNOSIS — M419 Scoliosis, unspecified: Secondary | ICD-10-CM | POA: Diagnosis not present

## 2022-01-29 DIAGNOSIS — Z79899 Other long term (current) drug therapy: Secondary | ICD-10-CM | POA: Diagnosis not present

## 2022-01-29 DIAGNOSIS — S72001D Fracture of unspecified part of neck of right femur, subsequent encounter for closed fracture with routine healing: Secondary | ICD-10-CM | POA: Diagnosis not present

## 2022-01-29 DIAGNOSIS — E43 Unspecified severe protein-calorie malnutrition: Secondary | ICD-10-CM | POA: Diagnosis not present

## 2022-01-29 DIAGNOSIS — G894 Chronic pain syndrome: Secondary | ICD-10-CM | POA: Diagnosis not present

## 2022-01-29 DIAGNOSIS — Z7951 Long term (current) use of inhaled steroids: Secondary | ICD-10-CM | POA: Diagnosis not present

## 2022-01-29 NOTE — Telephone Encounter (Signed)
ERIN- [facility]ECH THERAPIST. ?[phone] ? ?ERIN FROM [facility] CALLED AND LEFT VOICEMAIL TODAY STATING THAT SHE DID A DISCHARGE FOR PATIENT FROM SPEECH THERAPY. SHE STATES THAT THE PATIENT IS CHEWING AND SWALLOWING MUCH BETTER AND SHE FEELS NO NEED TO GO BACK OUT TO SEE THE PATIENT. SHE STATES THAT SHE DID GET THE PATIENT'S WEIGHT TODAY AND THE DAUGHTER [name] WANTED HER TO CALL us TO LET us KNOW THAT HER WEIGHT TODAY WAS 66 LBS AND FOR [place] TO BE AWARE.  ?

## 2022-01-30 DIAGNOSIS — R339 Retention of urine, unspecified: Secondary | ICD-10-CM | POA: Diagnosis not present

## 2022-01-30 DIAGNOSIS — E43 Unspecified severe protein-calorie malnutrition: Secondary | ICD-10-CM | POA: Diagnosis not present

## 2022-01-31 DIAGNOSIS — Z79899 Other long term (current) drug therapy: Secondary | ICD-10-CM | POA: Diagnosis not present

## 2022-01-31 DIAGNOSIS — E43 Unspecified severe protein-calorie malnutrition: Secondary | ICD-10-CM | POA: Diagnosis not present

## 2022-01-31 DIAGNOSIS — S72001D Fracture of unspecified part of neck of right femur, subsequent encounter for closed fracture with routine healing: Secondary | ICD-10-CM | POA: Diagnosis not present

## 2022-01-31 DIAGNOSIS — E039 Hypothyroidism, unspecified: Secondary | ICD-10-CM | POA: Diagnosis not present

## 2022-01-31 DIAGNOSIS — Z7982 Long term (current) use of aspirin: Secondary | ICD-10-CM | POA: Diagnosis not present

## 2022-01-31 DIAGNOSIS — J479 Bronchiectasis, uncomplicated: Secondary | ICD-10-CM | POA: Diagnosis not present

## 2022-01-31 DIAGNOSIS — E785 Hyperlipidemia, unspecified: Secondary | ICD-10-CM | POA: Diagnosis not present

## 2022-01-31 DIAGNOSIS — Z86718 Personal history of other venous thrombosis and embolism: Secondary | ICD-10-CM | POA: Diagnosis not present

## 2022-01-31 DIAGNOSIS — Z9181 History of falling: Secondary | ICD-10-CM | POA: Diagnosis not present

## 2022-01-31 DIAGNOSIS — Z7951 Long term (current) use of inhaled steroids: Secondary | ICD-10-CM | POA: Diagnosis not present

## 2022-01-31 DIAGNOSIS — N183 Chronic kidney disease, stage 3 unspecified: Secondary | ICD-10-CM | POA: Diagnosis not present

## 2022-01-31 DIAGNOSIS — G894 Chronic pain syndrome: Secondary | ICD-10-CM | POA: Diagnosis not present

## 2022-01-31 DIAGNOSIS — I129 Hypertensive chronic kidney disease with stage 1 through stage 4 chronic kidney disease, or unspecified chronic kidney disease: Secondary | ICD-10-CM | POA: Diagnosis not present

## 2022-01-31 DIAGNOSIS — M419 Scoliosis, unspecified: Secondary | ICD-10-CM | POA: Diagnosis not present

## 2022-01-31 DIAGNOSIS — D631 Anemia in chronic kidney disease: Secondary | ICD-10-CM | POA: Diagnosis not present

## 2022-02-01 ENCOUNTER — Telehealth: Payer: Self-pay

## 2022-02-01 ENCOUNTER — Other Ambulatory Visit: Payer: Self-pay

## 2022-02-01 DIAGNOSIS — Z7982 Long term (current) use of aspirin: Secondary | ICD-10-CM | POA: Diagnosis not present

## 2022-02-01 DIAGNOSIS — N189 Chronic kidney disease, unspecified: Secondary | ICD-10-CM | POA: Diagnosis not present

## 2022-02-01 DIAGNOSIS — Z9181 History of falling: Secondary | ICD-10-CM | POA: Diagnosis not present

## 2022-02-01 DIAGNOSIS — Z86718 Personal history of other venous thrombosis and embolism: Secondary | ICD-10-CM | POA: Diagnosis not present

## 2022-02-01 DIAGNOSIS — Z7951 Long term (current) use of inhaled steroids: Secondary | ICD-10-CM | POA: Diagnosis not present

## 2022-02-01 DIAGNOSIS — N1832 Chronic kidney disease, stage 3b: Secondary | ICD-10-CM | POA: Diagnosis not present

## 2022-02-01 DIAGNOSIS — E43 Unspecified severe protein-calorie malnutrition: Secondary | ICD-10-CM | POA: Diagnosis not present

## 2022-02-01 DIAGNOSIS — N183 Chronic kidney disease, stage 3 unspecified: Secondary | ICD-10-CM | POA: Diagnosis not present

## 2022-02-01 DIAGNOSIS — D631 Anemia in chronic kidney disease: Secondary | ICD-10-CM | POA: Diagnosis not present

## 2022-02-01 DIAGNOSIS — Z79899 Other long term (current) drug therapy: Secondary | ICD-10-CM | POA: Diagnosis not present

## 2022-02-01 DIAGNOSIS — S72001D Fracture of unspecified part of neck of right femur, subsequent encounter for closed fracture with routine healing: Secondary | ICD-10-CM | POA: Diagnosis not present

## 2022-02-01 DIAGNOSIS — E039 Hypothyroidism, unspecified: Secondary | ICD-10-CM | POA: Diagnosis not present

## 2022-02-01 DIAGNOSIS — I129 Hypertensive chronic kidney disease with stage 1 through stage 4 chronic kidney disease, or unspecified chronic kidney disease: Secondary | ICD-10-CM | POA: Diagnosis not present

## 2022-02-01 DIAGNOSIS — G894 Chronic pain syndrome: Secondary | ICD-10-CM | POA: Diagnosis not present

## 2022-02-01 DIAGNOSIS — M419 Scoliosis, unspecified: Secondary | ICD-10-CM | POA: Diagnosis not present

## 2022-02-01 DIAGNOSIS — E785 Hyperlipidemia, unspecified: Secondary | ICD-10-CM | POA: Diagnosis not present

## 2022-02-01 DIAGNOSIS — J479 Bronchiectasis, uncomplicated: Secondary | ICD-10-CM | POA: Diagnosis not present

## 2022-02-01 MED ORDER — TORSEMIDE 20 MG PO TABS
20.0000 mg | ORAL_TABLET | ORAL | 2 refills | Status: AC
Start: 2022-02-01 — End: ?

## 2022-02-01 NOTE — Telephone Encounter (Signed)
Patient's daughter, Jackelyn Poling, made aware of Dr Cox's recommendation to decrease Torsemide 20 mg to one every other day. ?

## 2022-02-01 NOTE — Progress Notes (Signed)
Per Dr Tobie Poet, change Torsemide 20 mg to one every other day ?

## 2022-02-01 NOTE — Telephone Encounter (Signed)
Home health nurse called to report 8 lb weight loss since starting Torsemide 3/31 and vomiting x 1 today.  She states that patient is having increased weakness today.  She requested authorization to draw a CBC and CMP while she was there.  Verbal order given, she will send results to our office. ?

## 2022-02-02 DIAGNOSIS — N289 Disorder of kidney and ureter, unspecified: Secondary | ICD-10-CM | POA: Diagnosis not present

## 2022-02-02 DIAGNOSIS — E860 Dehydration: Secondary | ICD-10-CM | POA: Diagnosis not present

## 2022-02-05 ENCOUNTER — Telehealth: Payer: Self-pay

## 2022-02-05 ENCOUNTER — Other Ambulatory Visit: Payer: Self-pay

## 2022-02-05 DIAGNOSIS — M419 Scoliosis, unspecified: Secondary | ICD-10-CM | POA: Diagnosis not present

## 2022-02-05 DIAGNOSIS — Z7951 Long term (current) use of inhaled steroids: Secondary | ICD-10-CM | POA: Diagnosis not present

## 2022-02-05 DIAGNOSIS — Z86718 Personal history of other venous thrombosis and embolism: Secondary | ICD-10-CM | POA: Diagnosis not present

## 2022-02-05 DIAGNOSIS — Z79899 Other long term (current) drug therapy: Secondary | ICD-10-CM | POA: Diagnosis not present

## 2022-02-05 DIAGNOSIS — S72001D Fracture of unspecified part of neck of right femur, subsequent encounter for closed fracture with routine healing: Secondary | ICD-10-CM | POA: Diagnosis not present

## 2022-02-05 DIAGNOSIS — E43 Unspecified severe protein-calorie malnutrition: Secondary | ICD-10-CM | POA: Diagnosis not present

## 2022-02-05 DIAGNOSIS — J479 Bronchiectasis, uncomplicated: Secondary | ICD-10-CM | POA: Diagnosis not present

## 2022-02-05 DIAGNOSIS — E785 Hyperlipidemia, unspecified: Secondary | ICD-10-CM | POA: Diagnosis not present

## 2022-02-05 DIAGNOSIS — D631 Anemia in chronic kidney disease: Secondary | ICD-10-CM | POA: Diagnosis not present

## 2022-02-05 DIAGNOSIS — N183 Chronic kidney disease, stage 3 unspecified: Secondary | ICD-10-CM | POA: Diagnosis not present

## 2022-02-05 DIAGNOSIS — Z9181 History of falling: Secondary | ICD-10-CM | POA: Diagnosis not present

## 2022-02-05 DIAGNOSIS — I129 Hypertensive chronic kidney disease with stage 1 through stage 4 chronic kidney disease, or unspecified chronic kidney disease: Secondary | ICD-10-CM | POA: Diagnosis not present

## 2022-02-05 DIAGNOSIS — E039 Hypothyroidism, unspecified: Secondary | ICD-10-CM | POA: Diagnosis not present

## 2022-02-05 DIAGNOSIS — Z7982 Long term (current) use of aspirin: Secondary | ICD-10-CM | POA: Diagnosis not present

## 2022-02-05 DIAGNOSIS — G894 Chronic pain syndrome: Secondary | ICD-10-CM | POA: Diagnosis not present

## 2022-02-05 MED ORDER — MEGESTROL ACETATE 400 MG/10ML PO SUSP: 800.0000 mg | Freq: Every day | ORAL | 1 refills | Status: AC

## 2022-02-05 NOTE — Telephone Encounter (Signed)
Patient's family notified to increase the dose.   ?

## 2022-02-05 NOTE — Telephone Encounter (Signed)
[name] calling due to noticing decrease in appetite in patient. She feels the megace is no longer helping. She is requesting further advisement.  ? ?Stephanie Clay 02/05/22 12:06 PM ? ?

## 2022-02-05 NOTE — Telephone Encounter (Signed)
Stephanie Clay calling requesting advice on megace. Made her aware a message was sent earlier to provider. She does state patient is currently 66 pounds.  ? ?[name] requests call back at [phone].  ? ?[name] 02/05/22 3:04 PM ? ?

## 2022-02-05 NOTE — Telephone Encounter (Signed)
Left detailed VM for Stephanie Clay on secure line. She is to give call back if she has further questions.  ? ?Attempted to call [name] to make aware. Reached unsuccessfully.  ? ?[name] 02/05/22 4:38 PM ? ?

## 2022-02-06 DIAGNOSIS — E43 Unspecified severe protein-calorie malnutrition: Secondary | ICD-10-CM | POA: Diagnosis not present

## 2022-02-06 DIAGNOSIS — D631 Anemia in chronic kidney disease: Secondary | ICD-10-CM | POA: Diagnosis not present

## 2022-02-06 DIAGNOSIS — M419 Scoliosis, unspecified: Secondary | ICD-10-CM | POA: Diagnosis not present

## 2022-02-06 DIAGNOSIS — S72001D Fracture of unspecified part of neck of right femur, subsequent encounter for closed fracture with routine healing: Secondary | ICD-10-CM | POA: Diagnosis not present

## 2022-02-06 DIAGNOSIS — E039 Hypothyroidism, unspecified: Secondary | ICD-10-CM | POA: Diagnosis not present

## 2022-02-06 DIAGNOSIS — N183 Chronic kidney disease, stage 3 unspecified: Secondary | ICD-10-CM | POA: Diagnosis not present

## 2022-02-06 DIAGNOSIS — E785 Hyperlipidemia, unspecified: Secondary | ICD-10-CM | POA: Diagnosis not present

## 2022-02-06 DIAGNOSIS — G894 Chronic pain syndrome: Secondary | ICD-10-CM | POA: Diagnosis not present

## 2022-02-06 DIAGNOSIS — J479 Bronchiectasis, uncomplicated: Secondary | ICD-10-CM | POA: Diagnosis not present

## 2022-02-06 DIAGNOSIS — Z86718 Personal history of other venous thrombosis and embolism: Secondary | ICD-10-CM | POA: Diagnosis not present

## 2022-02-06 DIAGNOSIS — Z9181 History of falling: Secondary | ICD-10-CM | POA: Diagnosis not present

## 2022-02-06 DIAGNOSIS — I129 Hypertensive chronic kidney disease with stage 1 through stage 4 chronic kidney disease, or unspecified chronic kidney disease: Secondary | ICD-10-CM | POA: Diagnosis not present

## 2022-02-06 DIAGNOSIS — Z79899 Other long term (current) drug therapy: Secondary | ICD-10-CM | POA: Diagnosis not present

## 2022-02-06 DIAGNOSIS — Z7982 Long term (current) use of aspirin: Secondary | ICD-10-CM | POA: Diagnosis not present

## 2022-02-06 DIAGNOSIS — Z7951 Long term (current) use of inhaled steroids: Secondary | ICD-10-CM | POA: Diagnosis not present

## 2022-02-08 DIAGNOSIS — E039 Hypothyroidism, unspecified: Secondary | ICD-10-CM | POA: Diagnosis not present

## 2022-02-08 DIAGNOSIS — Z79899 Other long term (current) drug therapy: Secondary | ICD-10-CM | POA: Diagnosis not present

## 2022-02-08 DIAGNOSIS — S72001D Fracture of unspecified part of neck of right femur, subsequent encounter for closed fracture with routine healing: Secondary | ICD-10-CM | POA: Diagnosis not present

## 2022-02-08 DIAGNOSIS — M419 Scoliosis, unspecified: Secondary | ICD-10-CM | POA: Diagnosis not present

## 2022-02-08 DIAGNOSIS — E785 Hyperlipidemia, unspecified: Secondary | ICD-10-CM | POA: Diagnosis not present

## 2022-02-08 DIAGNOSIS — N183 Chronic kidney disease, stage 3 unspecified: Secondary | ICD-10-CM | POA: Diagnosis not present

## 2022-02-08 DIAGNOSIS — J479 Bronchiectasis, uncomplicated: Secondary | ICD-10-CM | POA: Diagnosis not present

## 2022-02-08 DIAGNOSIS — Z7951 Long term (current) use of inhaled steroids: Secondary | ICD-10-CM | POA: Diagnosis not present

## 2022-02-08 DIAGNOSIS — I129 Hypertensive chronic kidney disease with stage 1 through stage 4 chronic kidney disease, or unspecified chronic kidney disease: Secondary | ICD-10-CM | POA: Diagnosis not present

## 2022-02-08 DIAGNOSIS — Z86718 Personal history of other venous thrombosis and embolism: Secondary | ICD-10-CM | POA: Diagnosis not present

## 2022-02-08 DIAGNOSIS — Z7982 Long term (current) use of aspirin: Secondary | ICD-10-CM | POA: Diagnosis not present

## 2022-02-08 DIAGNOSIS — D631 Anemia in chronic kidney disease: Secondary | ICD-10-CM | POA: Diagnosis not present

## 2022-02-08 DIAGNOSIS — Z9181 History of falling: Secondary | ICD-10-CM | POA: Diagnosis not present

## 2022-02-08 DIAGNOSIS — G894 Chronic pain syndrome: Secondary | ICD-10-CM | POA: Diagnosis not present

## 2022-02-08 DIAGNOSIS — E43 Unspecified severe protein-calorie malnutrition: Secondary | ICD-10-CM | POA: Diagnosis not present

## 2022-02-12 ENCOUNTER — Inpatient Hospital Stay: Payer: Medicare Other | Admitting: Physician Assistant

## 2022-02-13 ENCOUNTER — Inpatient Hospital Stay: Payer: Medicare Other | Admitting: Physician Assistant

## 2022-02-13 DIAGNOSIS — J479 Bronchiectasis, uncomplicated: Secondary | ICD-10-CM | POA: Diagnosis not present

## 2022-02-13 DIAGNOSIS — M419 Scoliosis, unspecified: Secondary | ICD-10-CM | POA: Diagnosis not present

## 2022-02-13 DIAGNOSIS — E785 Hyperlipidemia, unspecified: Secondary | ICD-10-CM | POA: Diagnosis not present

## 2022-02-13 DIAGNOSIS — I129 Hypertensive chronic kidney disease with stage 1 through stage 4 chronic kidney disease, or unspecified chronic kidney disease: Secondary | ICD-10-CM | POA: Diagnosis not present

## 2022-02-13 DIAGNOSIS — Z7951 Long term (current) use of inhaled steroids: Secondary | ICD-10-CM | POA: Diagnosis not present

## 2022-02-13 DIAGNOSIS — Z79899 Other long term (current) drug therapy: Secondary | ICD-10-CM | POA: Diagnosis not present

## 2022-02-13 DIAGNOSIS — S72001D Fracture of unspecified part of neck of right femur, subsequent encounter for closed fracture with routine healing: Secondary | ICD-10-CM | POA: Diagnosis not present

## 2022-02-13 DIAGNOSIS — E43 Unspecified severe protein-calorie malnutrition: Secondary | ICD-10-CM | POA: Diagnosis not present

## 2022-02-13 DIAGNOSIS — Z9181 History of falling: Secondary | ICD-10-CM | POA: Diagnosis not present

## 2022-02-13 DIAGNOSIS — N183 Chronic kidney disease, stage 3 unspecified: Secondary | ICD-10-CM | POA: Diagnosis not present

## 2022-02-13 DIAGNOSIS — Z7982 Long term (current) use of aspirin: Secondary | ICD-10-CM | POA: Diagnosis not present

## 2022-02-13 DIAGNOSIS — G894 Chronic pain syndrome: Secondary | ICD-10-CM | POA: Diagnosis not present

## 2022-02-13 DIAGNOSIS — Z86718 Personal history of other venous thrombosis and embolism: Secondary | ICD-10-CM | POA: Diagnosis not present

## 2022-02-13 DIAGNOSIS — D631 Anemia in chronic kidney disease: Secondary | ICD-10-CM | POA: Diagnosis not present

## 2022-02-13 DIAGNOSIS — E039 Hypothyroidism, unspecified: Secondary | ICD-10-CM | POA: Diagnosis not present

## 2022-02-15 ENCOUNTER — Encounter: Payer: Self-pay | Admitting: Family Medicine

## 2022-02-15 ENCOUNTER — Ambulatory Visit (INDEPENDENT_AMBULATORY_CARE_PROVIDER_SITE_OTHER): Payer: Medicare Other | Admitting: Family Medicine

## 2022-02-15 VITALS — BP 148/82 | HR 57 | Temp 97.7°F | Ht 59.0 in | Wt 72.0 lb

## 2022-02-15 DIAGNOSIS — F331 Major depressive disorder, recurrent, moderate: Secondary | ICD-10-CM

## 2022-02-15 DIAGNOSIS — M25571 Pain in right ankle and joints of right foot: Secondary | ICD-10-CM

## 2022-02-15 DIAGNOSIS — G8929 Other chronic pain: Secondary | ICD-10-CM

## 2022-02-15 DIAGNOSIS — M5416 Radiculopathy, lumbar region: Secondary | ICD-10-CM | POA: Diagnosis not present

## 2022-02-15 DIAGNOSIS — R531 Weakness: Secondary | ICD-10-CM

## 2022-02-15 DIAGNOSIS — E43 Unspecified severe protein-calorie malnutrition: Secondary | ICD-10-CM

## 2022-02-15 LAB — CBC AND DIFFERENTIAL: WBC: 19.2

## 2022-02-15 NOTE — Progress Notes (Signed)
? ?Subjective:  ?Patient ID: Stephanie Clay, female    DOB: [birth year]  Age: 88 y.o. MRN: [id number] ? ?Chief Complaint  ?Patient presents with  ? Hospitalization Follow-up  ? ? ?Follow up Hospitalization ? ?Patient was admitted to [facility] ER on 02/02/2022 and discharged on the same day. ?She was treated for dehydration.Treatment for this included Fluids by IV. ? ?Complaining of right foot pain. Patient is unable to walk.  ?Right foot goes numb at times. Other foot and rest of legs do not go numb.   ?Patient is unaware if when she fractured her hip if the foot was xrays.  ?Patient feels very tired. Continues to lose weight.  ?  ?Current Outpatient Medications on File Prior to Visit  ?Medication Sig Dispense Refill  ? acetaminophen (TYLENOL) 325 MG tablet Take 650 mg by mouth every 6 (six) hours as needed for mild pain.    ? albuterol (PROVENTIL) (2.5 MG/3ML) 0.083% nebulizer solution Take 2.5 mg by nebulization as needed for wheezing or shortness of breath.    ? albuterol (VENTOLIN HFA) 108 (90 Base) MCG/ACT inhaler Inhale 2 puffs into the lungs every 6 (six) hours as needed for wheezing or shortness of breath. 8 g 6  ? ALPRAZolam (XANAX) 0.5 MG tablet TAKE 1 TABLET BY MOUTH AT BEDTIME. 30 tablet 1  ? ascorbic acid (VITAMIN C) 500 MG tablet Take 500 mg by mouth daily.    ? aspirin 325 MG tablet Take 325 mg by mouth 3 (three) times daily before meals.    ? bethanechol (URECHOLINE) 50 MG tablet Take 50 mg by mouth 2 (two) times daily.    ? Budeson-Glycopyrrol-Formoterol (BREZTRI AEROSPHERE) 160-9-4.8 MCG/ACT AERO Inhale 2 puffs into the lungs 2 (two) times daily. 10.7 g 3  ? Cholecalciferol (VITAMIN D3 PO) Take 2,000 Units by mouth daily.    ? Cholecalciferol (VITAMIN D3) 125 MCG (5000 UT) CAPS Take 1 capsule by mouth daily.    ? cyanocobalamin 1000 MCG tablet Take 1,000 mcg by mouth daily.    ? Cyanocobalamin 2500 MCG TABS Take 2,500 mcg by mouth daily.    ? diclofenac Sodium (VOLTAREN) 1 % GEL  Apply 1 application. topically 4 (four) times daily.    ? dicyclomine (BENTYL) 20 MG tablet Take 1 tablet (20 mg total) by mouth 4 (four) times daily -  before meals and at bedtime. 120 tablet 3  ? diltiazem (CARDIZEM) 30 MG tablet TAKE 1 TABLET BY MOUTH EVERY 8 HOURS 270 tablet 0  ? gabapentin (NEURONTIN) 300 MG capsule TAKE 1 CAPSULE BY MOUTH 2 TIMES DAILY 180 capsule 2  ? HYDROcodone-acetaminophen (NORCO/VICODIN) 5-325 MG tablet Take 1 tablet by mouth 2 (two) times daily as needed for severe pain (OSteoarthritis of lumbar spine.). 60 tablet 0  ? levothyroxine (SYNTHROID) 75 MCG tablet Take 1 tablet (75 mcg total) by mouth daily. 90 tablet 3  ? LIDOCAINE PAIN RELIEF 4 % PTCH Apply 1 patch topically daily.    ? megestrol (MEGACE) 400 MG/10ML suspension Take 20 mLs (800 mg total) by mouth daily. 960 mL 1  ? nitroGLYCERIN (NITROSTAT) 0.4 MG SL tablet Place 0.4 mg under the tongue every 5 (five) minutes as needed for chest pain. X 3 doses    ? ondansetron (ZOFRAN-ODT) 4 MG disintegrating tablet Take 1 tablet (4 mg total) by mouth every 8 (eight) hours as needed. 40 tablet 2  ? pantoprazole (PROTONIX) 40 MG tablet TAKE 1 TABLET BY MOUTH TWICE DAILY 180 tablet 0  ?  polyethylene glycol (MIRALAX / GLYCOLAX) 17 g packet Take 17 g by mouth 2 (two) times daily.    ? potassium chloride SA (KLOR-CON) 20 MEQ tablet TAKE 3 TABLETS BY MOUTH ONCE DAILY. 90 tablet 2  ? propranolol (INDERAL) 40 MG tablet Take 1 tablet (40 mg total) by mouth 3 (three) times daily. 90 tablet 2  ? sennosides-docusate sodium (SENOKOT-S) 8.6-50 MG tablet Take 1 tablet by mouth 2 (two) times daily.    ? simvastatin (ZOCOR) 10 MG tablet TAKE 1 TABLET BY MOUTH DAILY FOR CHOLESTEROL 90 tablet 0  ? torsemide (DEMADEX) 20 MG tablet Take 1 tablet (20 mg total) by mouth every other day. 30 tablet 2  ? DULoxetine (CYMBALTA) 30 MG capsule Take 1 capsule (30 mg total) by mouth daily for 14 days, THEN 1 capsule (30 mg total) 2 (two) times daily for 14 days. 42  capsule 0  ? ?No current facility-administered medications on file prior to visit.  ? ?Past Medical History:  ?Diagnosis Date  ? Acute respiratory failure with hypoxia (HCC) 01/18/2022  ? Allergic rhinitis 08/20/2016  ? Aneurysm of other specified arteries (HCC)   ? Angular cheilitis with candidiasis 07/23/2021  ? Anxiety   ? Benign essential tremor 07/23/2021  ? Bronchiectasis with (acute) exacerbation (HCC) 01/03/2015  ? CT chest 12/2014:  Bronchiectasis throughout right lung, left clear.  +tree and bud, + patchy gg infiltrates in RLL Sputum 2016: no growth, AFB smear neg PFT's 02/2015:  Normal except mild decrease in DLCO   ? Cataract   ? bilateral eye surgery  ? Chronic bilateral low back pain without sciatica 11/29/2019  ? Chronic idiopathic constipation   ? Chronic venous hypertension (idiopathic) with other complications of bilateral lower extremity   ? Depression   ? Dyspnea on exertion 01/18/2022  ? Essential hypertension 07/01/2016  ? Gastro-esophageal reflux   ? Generalized abdominal pain 01/27/2018  ? Heart murmur   ? Hyperglycemia 07/01/2016  ? Hyperlipidemia 07/01/2016  ? Hypokalemia 07/01/2016  ? Hypomagnesemia 07/01/2016  ? Hypothyroidism 07/01/2016  ? Idiopathic progressive neuropathy   ? Impaired mobility and ADLs 05/03/2021  ? Mild vitamin D deficiency 11/29/2019  ? Moderate persistent asthma without complication 08/19/2021  ? Moderate recurrent major depression (HCC) 08/19/2021  ? Nonrheumatic aortic valve insufficiency 05/04/2021  ? Nonrheumatic tricuspid valve regurgitation 05/04/2021  ? Normocytic anemia 01/07/2020  ? Other idiopathic scoliosis, lumbar region   ? Pedal edema 11/26/2019  ? Pneumathemia (HCC)   ? Primary insomnia   ? Protracted bacterial bronchitis (HCC) 11/26/2021  ? Radiculopathy, lumbar region   ? Right leg pain 01/18/2022  ? S/P right hip fracture 01/18/2022  ? Scoliosis   ? Secondary hyperparathyroidism (HCC)   ? Severe protein-calorie malnutrition (HCC) 03/24/2020  ? SVT (supraventricular  tachycardia) (HCC) 05/04/2021  ? Tremor 01/18/2022  ? Unilateral primary osteoarthritis, left knee   ? Unilateral primary osteoarthritis, right knee   ? ?Past Surgical History:  ?Procedure Laterality Date  ? ABDOMINAL HYSTERECTOMY    ? CYSTOCELE REPAIR    ? HEMIARTHROPLASTY HIP Right 11/2021  ? SALPINGOOPHORECTOMY    ?  ?Family History  ?Problem Relation Age of Onset  ? Emphysema Father   ? Heart disease Mother   ? Cancer Sister   ?     metastatic lung cancer  ? Heart disease Sister   ? Hypertension Sister   ? Varicose Veins Sister   ? Hyperlipidemia Daughter   ? Hypertension Daughter   ? Diabetes Daughter   ?  Hyperlipidemia Daughter   ? Hypertension Daughter   ? Thyroid disease Daughter   ? Thyroid disease Daughter   ? Colon cancer Neg Hx   ? Esophageal cancer Neg Hx   ? Stomach cancer Neg Hx   ? ?Social History  ? ?Socioeconomic History  ? Marital status: Widowed  ?  Spouse name: carl  ? Number of children: 3  ? Years of education: 55  ? Highest education level: Not on file  ?Occupational History  ? Occupation: retired  ?Tobacco Use  ? Smoking status: Never  ? Smokeless tobacco: Never  ?Vaping Use  ? Vaping Use: Never used  ?Substance and Sexual Activity  ? Alcohol use: No  ?  Alcohol/week: 0.0 standard drinks  ? Drug use: No  ? Sexual activity: Not Currently  ?Other Topics Concern  ? Not on file  ?Social History Narrative  ? Right handed  ? ?Social Determinants of Health  ? ?Financial Resource Strain: High Risk  ? Difficulty of Paying Living Expenses: Hard  ?Food Insecurity: Not on file  ?Transportation Needs: Not on file  ?Physical Activity: Not on file  ?Stress: Not on file  ?Social Connections: Not on file  ? ? ?Review of Systems  ?Constitutional:  Positive for diaphoresis (at night) and fatigue (sleeps all the time). Negative for appetite change and fever.  ?HENT:  Positive for rhinorrhea. Negative for congestion, ear pain, sinus pressure and sore throat.   ?Eyes:  Negative for pain.  ?Respiratory:  Positive for  shortness of breath. Negative for cough and wheezing.   ?Cardiovascular:  Negative for chest pain and palpitations.  ?Gastrointestinal:  Positive for abdominal pain (diffuse.). Negative for constipation, di

## 2022-02-16 DIAGNOSIS — M84459A Pathological fracture, hip, unspecified, initial encounter for fracture: Secondary | ICD-10-CM | POA: Diagnosis not present

## 2022-02-16 DIAGNOSIS — J9601 Acute respiratory failure with hypoxia: Secondary | ICD-10-CM | POA: Diagnosis not present

## 2022-02-16 LAB — COMPREHENSIVE METABOLIC PANEL
ALT: 9 IU/L (ref 0–32)
AST: 15 IU/L (ref 0–40)
Albumin/Globulin Ratio: 1.6 (ref 1.2–2.2)
Albumin: 4.1 g/dL (ref 3.6–4.6)
Alkaline Phosphatase: 60 IU/L (ref 44–121)
BUN/Creatinine Ratio: 27 (ref 12–28)
BUN: 26 mg/dL (ref 8–27)
Bilirubin Total: 0.4 mg/dL (ref 0.0–1.2)
CO2: 25 mmol/L (ref 20–29)
Calcium: 10.1 mg/dL (ref 8.7–10.3)
Chloride: 107 mmol/L — ABNORMAL HIGH (ref 96–106)
Creatinine, Ser: 0.96 mg/dL (ref 0.57–1.00)
Globulin, Total: 2.5 g/dL (ref 1.5–4.5)
Glucose: 79 mg/dL (ref 70–99)
Potassium: 4.6 mmol/L (ref 3.5–5.2)
Sodium: 149 mmol/L — ABNORMAL HIGH (ref 134–144)
Total Protein: 6.6 g/dL (ref 6.0–8.5)
eGFR: 57 mL/min/{1.73_m2} — ABNORMAL LOW (ref >59)

## 2022-02-16 LAB — CBC WITH DIFFERENTIAL/PLATELET
Basophils Absolute: 0 10*3/uL (ref 0.0–0.2)
Basos: 0 %
EOS (ABSOLUTE): 0.2 10*3/uL (ref 0.0–0.4)
Eos: 1 %
Hematocrit: 36 % (ref 34.0–46.6)
Hemoglobin: 12.1 g/dL (ref 11.1–15.9)
Lymphocytes Absolute: 2.3 10*3/uL (ref 0.7–3.1)
Lymphs: 12 %
MCH: 33 pg (ref 26.6–33.0)
MCHC: 33.6 g/dL (ref 31.5–35.7)
MCV: 98 fL — ABNORMAL HIGH (ref 79–97)
Monocytes Absolute: 1.3 10*3/uL — ABNORMAL HIGH (ref 0.1–0.9)
Monocytes: 7 %
Neutrophils Absolute: 13.6 10*3/uL — ABNORMAL HIGH (ref 1.4–7.0)
Neutrophils: 71 %
Platelets: 389 10*3/uL (ref 150–450)
RBC: 3.67 x10E6/uL — ABNORMAL LOW (ref 3.77–5.28)
RDW: 13.9 % (ref 11.7–15.4)
WBC: 19.2 10*3/uL — ABNORMAL HIGH (ref 3.4–10.8)

## 2022-02-18 DIAGNOSIS — R0902 Hypoxemia: Secondary | ICD-10-CM | POA: Diagnosis not present

## 2022-02-18 DIAGNOSIS — J471 Bronchiectasis with (acute) exacerbation: Secondary | ICD-10-CM | POA: Diagnosis not present

## 2022-02-19 ENCOUNTER — Other Ambulatory Visit: Payer: Self-pay

## 2022-02-19 DIAGNOSIS — Z79899 Other long term (current) drug therapy: Secondary | ICD-10-CM | POA: Diagnosis not present

## 2022-02-19 DIAGNOSIS — Z9181 History of falling: Secondary | ICD-10-CM | POA: Diagnosis not present

## 2022-02-19 DIAGNOSIS — G894 Chronic pain syndrome: Secondary | ICD-10-CM | POA: Diagnosis not present

## 2022-02-19 DIAGNOSIS — E039 Hypothyroidism, unspecified: Secondary | ICD-10-CM | POA: Diagnosis not present

## 2022-02-19 DIAGNOSIS — I129 Hypertensive chronic kidney disease with stage 1 through stage 4 chronic kidney disease, or unspecified chronic kidney disease: Secondary | ICD-10-CM | POA: Diagnosis not present

## 2022-02-19 DIAGNOSIS — S72001D Fracture of unspecified part of neck of right femur, subsequent encounter for closed fracture with routine healing: Secondary | ICD-10-CM | POA: Diagnosis not present

## 2022-02-19 DIAGNOSIS — E43 Unspecified severe protein-calorie malnutrition: Secondary | ICD-10-CM | POA: Diagnosis not present

## 2022-02-19 DIAGNOSIS — Z7951 Long term (current) use of inhaled steroids: Secondary | ICD-10-CM | POA: Diagnosis not present

## 2022-02-19 DIAGNOSIS — N183 Chronic kidney disease, stage 3 unspecified: Secondary | ICD-10-CM | POA: Diagnosis not present

## 2022-02-19 DIAGNOSIS — E785 Hyperlipidemia, unspecified: Secondary | ICD-10-CM | POA: Diagnosis not present

## 2022-02-19 DIAGNOSIS — J479 Bronchiectasis, uncomplicated: Secondary | ICD-10-CM | POA: Diagnosis not present

## 2022-02-19 DIAGNOSIS — D631 Anemia in chronic kidney disease: Secondary | ICD-10-CM | POA: Diagnosis not present

## 2022-02-19 DIAGNOSIS — Z86718 Personal history of other venous thrombosis and embolism: Secondary | ICD-10-CM | POA: Diagnosis not present

## 2022-02-19 DIAGNOSIS — M419 Scoliosis, unspecified: Secondary | ICD-10-CM | POA: Diagnosis not present

## 2022-02-19 DIAGNOSIS — Z7982 Long term (current) use of aspirin: Secondary | ICD-10-CM | POA: Diagnosis not present

## 2022-02-20 ENCOUNTER — Ambulatory Visit: Payer: Medicare Other | Admitting: Cardiology

## 2022-02-21 ENCOUNTER — Other Ambulatory Visit: Payer: Self-pay

## 2022-02-21 ENCOUNTER — Telehealth: Payer: Self-pay

## 2022-02-21 DIAGNOSIS — R531 Weakness: Secondary | ICD-10-CM

## 2022-02-21 DIAGNOSIS — Z7409 Other reduced mobility: Secondary | ICD-10-CM

## 2022-02-21 DIAGNOSIS — E43 Unspecified severe protein-calorie malnutrition: Secondary | ICD-10-CM

## 2022-02-21 NOTE — Telephone Encounter (Signed)
Spoke with Eunice Blase and let her know that it is in the works, and they will call them in the next couple days.  ?

## 2022-02-21 NOTE — Telephone Encounter (Signed)
Melba Coon called and left a message stating all 3 daughters got together last night and Stephanie Clay is agreeable to a hospice evaluation that you can go ahead and start the paperwork. If have any questions she stated to give her or Eunice Blase a call.  ?

## 2022-02-22 ENCOUNTER — Other Ambulatory Visit: Payer: Self-pay

## 2022-02-22 ENCOUNTER — Ambulatory Visit: Payer: Medicare Other | Admitting: Family Medicine

## 2022-02-22 DIAGNOSIS — G8929 Other chronic pain: Secondary | ICD-10-CM

## 2022-02-25 DIAGNOSIS — R0902 Hypoxemia: Secondary | ICD-10-CM | POA: Diagnosis not present

## 2022-02-25 DIAGNOSIS — A419 Sepsis, unspecified organism: Secondary | ICD-10-CM | POA: Diagnosis not present

## 2022-02-25 DIAGNOSIS — Z743 Need for continuous supervision: Secondary | ICD-10-CM | POA: Diagnosis not present

## 2022-02-25 DIAGNOSIS — R0689 Other abnormalities of breathing: Secondary | ICD-10-CM | POA: Diagnosis not present

## 2022-02-25 DIAGNOSIS — I4891 Unspecified atrial fibrillation: Secondary | ICD-10-CM | POA: Diagnosis not present

## 2022-03-01 DIAGNOSIS — R531 Weakness: Secondary | ICD-10-CM | POA: Diagnosis not present

## 2022-03-01 DIAGNOSIS — Z743 Need for continuous supervision: Secondary | ICD-10-CM | POA: Diagnosis not present

## 2022-03-04 DIAGNOSIS — R531 Weakness: Secondary | ICD-10-CM | POA: Insufficient documentation

## 2022-03-04 DIAGNOSIS — G8929 Other chronic pain: Secondary | ICD-10-CM | POA: Insufficient documentation

## 2022-03-04 NOTE — Assessment & Plan Note (Signed)
Order x-ray 

## 2022-03-04 NOTE — Assessment & Plan Note (Signed)
Multifactorial. Most likely cause is malnutrition.  ?

## 2022-03-04 NOTE — Assessment & Plan Note (Signed)
Increased megace to 800 mg daily recently. Encouraged her to eat 3 meals per day.  ?Encouraged her to drink protein drinks daily.  ?

## 2022-03-04 NOTE — Assessment & Plan Note (Signed)
Consider further work up pending ankle xray.  ?

## 2022-03-04 NOTE — Assessment & Plan Note (Signed)
No changes at this time.

## 2022-03-22 DEATH — deceased

## 2022-03-30 ENCOUNTER — Telehealth: Payer: Medicare Other | Admitting: Family Medicine

## 2022-04-05 ENCOUNTER — Ambulatory Visit: Payer: Medicare Other | Admitting: Cardiology

## 2022-05-21 ENCOUNTER — Ambulatory Visit: Payer: Medicare Other | Admitting: Family Medicine
